# Patient Record
Sex: Female | Born: 1940
Health system: Southern US, Community
[De-identification: ages and names within clinical notes are randomized; demographics above are authoritative.]

## PROBLEM LIST (undated history)

## (undated) DIAGNOSIS — T8859XA Other complications of anesthesia, initial encounter: Secondary | ICD-10-CM

## (undated) DIAGNOSIS — T4145XA Adverse effect of unspecified anesthetic, initial encounter: Secondary | ICD-10-CM

## (undated) DIAGNOSIS — G4730 Sleep apnea, unspecified: Secondary | ICD-10-CM

## (undated) DIAGNOSIS — H9190 Unspecified hearing loss, unspecified ear: Secondary | ICD-10-CM

## (undated) DIAGNOSIS — F419 Anxiety disorder, unspecified: Secondary | ICD-10-CM

## (undated) DIAGNOSIS — I208 Other forms of angina pectoris: Secondary | ICD-10-CM

## (undated) DIAGNOSIS — I2510 Atherosclerotic heart disease of native coronary artery without angina pectoris: Secondary | ICD-10-CM

## (undated) DIAGNOSIS — J45909 Unspecified asthma, uncomplicated: Secondary | ICD-10-CM

## (undated) DIAGNOSIS — C801 Malignant (primary) neoplasm, unspecified: Secondary | ICD-10-CM

## (undated) DIAGNOSIS — I2089 Other forms of angina pectoris: Secondary | ICD-10-CM

## (undated) DIAGNOSIS — I890 Lymphedema, not elsewhere classified: Secondary | ICD-10-CM

## (undated) DIAGNOSIS — J189 Pneumonia, unspecified organism: Secondary | ICD-10-CM

## (undated) DIAGNOSIS — R112 Nausea with vomiting, unspecified: Secondary | ICD-10-CM

## (undated) DIAGNOSIS — U071 COVID-19: Secondary | ICD-10-CM

## (undated) DIAGNOSIS — Z9889 Other specified postprocedural states: Secondary | ICD-10-CM

## (undated) DIAGNOSIS — M1990 Unspecified osteoarthritis, unspecified site: Secondary | ICD-10-CM

## (undated) DIAGNOSIS — H269 Unspecified cataract: Secondary | ICD-10-CM

## (undated) DIAGNOSIS — G2581 Restless legs syndrome: Secondary | ICD-10-CM

## (undated) DIAGNOSIS — R079 Chest pain, unspecified: Secondary | ICD-10-CM

## (undated) HISTORY — PX: CHOLECYSTECTOMY: SHX55

## (undated) HISTORY — DX: Atherosclerotic heart disease of native coronary artery without angina pectoris: I25.10

## (undated) HISTORY — DX: Malignant (primary) neoplasm, unspecified: C80.1

## (undated) HISTORY — DX: Chest pain, unspecified: R07.9

## (undated) HISTORY — PX: ABDOMINAL HYSTERECTOMY: SHX81

## (undated) HISTORY — DX: Lymphedema, not elsewhere classified: I89.0

## (undated) HISTORY — PX: TONSILLECTOMY: SUR1361

## (undated) HISTORY — DX: Anxiety disorder, unspecified: F41.9

## (undated) HISTORY — PX: SHOULDER SURGERY: SHX246

## (undated) HISTORY — PX: BELPHAROPTOSIS REPAIR: SHX369

## (undated) HISTORY — PX: CARDIAC CATHETERIZATION: SHX172

## (undated) HISTORY — PX: BACK SURGERY: SHX140

## (undated) HISTORY — PX: HERNIA REPAIR: SHX51

---

## 2012-12-18 HISTORY — PX: COLONOSCOPY: SHX174

## 2014-10-06 ENCOUNTER — Other Ambulatory Visit: Payer: Self-pay | Admitting: Orthopedic Surgery

## 2014-10-14 ENCOUNTER — Encounter (HOSPITAL_COMMUNITY): Payer: Self-pay | Admitting: Pharmacy Technician

## 2014-10-16 ENCOUNTER — Encounter (HOSPITAL_COMMUNITY)
Admission: RE | Admit: 2014-10-16 | Discharge: 2014-10-16 | Disposition: A | Discharge status: Home / Self Care | Payer: Medicare Other | Source: Ambulatory Visit | Attending: Orthopedic Surgery | Admitting: Orthopedic Surgery

## 2014-10-16 ENCOUNTER — Encounter (HOSPITAL_COMMUNITY): Payer: Self-pay

## 2014-10-16 DIAGNOSIS — Z01812 Encounter for preprocedural laboratory examination: Secondary | ICD-10-CM | POA: Diagnosis present

## 2014-10-16 HISTORY — DX: Adverse effect of unspecified anesthetic, initial encounter: T41.45XA

## 2014-10-16 HISTORY — DX: Unspecified asthma, uncomplicated: J45.909

## 2014-10-16 HISTORY — DX: Other complications of anesthesia, initial encounter: T88.59XA

## 2014-10-16 HISTORY — DX: Unspecified osteoarthritis, unspecified site: M19.90

## 2014-10-16 HISTORY — DX: Pneumonia, unspecified organism: J18.9

## 2014-10-16 HISTORY — DX: Sleep apnea, unspecified: G47.30

## 2014-10-16 HISTORY — DX: Restless legs syndrome: G25.81

## 2014-10-16 LAB — BASIC METABOLIC PANEL
Anion gap: 12 (ref 5–15)
BUN: 16 mg/dL (ref 6–23)
CHLORIDE: 104 meq/L (ref 96–112)
CO2: 27 mEq/L (ref 19–32)
Calcium: 9.5 mg/dL (ref 8.4–10.5)
Creatinine, Ser: 0.58 mg/dL (ref 0.50–1.10)
GFR calc Af Amer: >90 mL/min (ref >90)
GFR calc non Af Amer: 89 mL/min — ABNORMAL LOW (ref >90)
GLUCOSE: 93 mg/dL (ref 70–99)
POTASSIUM: 4.2 meq/L (ref 3.7–5.3)
Sodium: 143 mEq/L (ref 137–147)

## 2014-10-16 LAB — TYPE AND SCREEN
ABO/RH(D): O POS
Antibody Screen: NEGATIVE

## 2014-10-16 LAB — CBC WITH DIFFERENTIAL/PLATELET
Basophils Absolute: 0.1 10*3/uL (ref 0.0–0.1)
Basophils Relative: 1 % (ref 0–1)
EOS ABS: 0.2 10*3/uL (ref 0.0–0.7)
EOS PCT: 3 % (ref 0–5)
HCT: 39.7 % (ref 36.0–46.0)
HEMOGLOBIN: 12.7 g/dL (ref 12.0–15.0)
Lymphocytes Relative: 30 % (ref 12–46)
Lymphs Abs: 2 10*3/uL (ref 0.7–4.0)
MCH: 28.8 pg (ref 26.0–34.0)
MCHC: 32 g/dL (ref 30.0–36.0)
MCV: 90 fL (ref 78.0–100.0)
MONOS PCT: 8 % (ref 3–12)
Monocytes Absolute: 0.5 10*3/uL (ref 0.1–1.0)
Neutro Abs: 3.9 10*3/uL (ref 1.7–7.7)
Neutrophils Relative %: 58 % (ref 43–77)
Platelets: 255 10*3/uL (ref 150–400)
RBC: 4.41 MIL/uL (ref 3.87–5.11)
RDW: 13.9 % (ref 11.5–15.5)
WBC: 6.8 10*3/uL (ref 4.0–10.5)

## 2014-10-16 LAB — ABO/RH: ABO/RH(D): O POS

## 2014-10-16 LAB — SURGICAL PCR SCREEN
MRSA, PCR: NEGATIVE
Staphylococcus aureus: NEGATIVE

## 2014-10-16 LAB — APTT: aPTT: 35 seconds (ref 24–37)

## 2014-10-16 LAB — PROTIME-INR
INR: 0.97 (ref 0.00–1.49)
Prothrombin Time: 13 seconds (ref 11.6–15.2)

## 2014-10-16 NOTE — Progress Notes (Signed)
Primary - dr. Butch Penny stone  hasnt seen cardiologist ekg and chest xray done in July 2015 at [facility] in [place] - will request records

## 2014-10-16 NOTE — Pre-Procedure Instructions (Signed)
Pennye A Golberg  10/16/2014   Your procedure is scheduled on:  Monday, November 9th  Report to [facility] Admitting at 530 AM.  Call this number if you have problems the morning of surgery: [phone]   Remember:   Do not eat food or drink liquids after midnight.   Take these medicines the morning of surgery with A SIP OF WATER: gabapentin, symbicort, vicodin if needed  Stop taking aspirin, OTC vitamins/herbal medications, NSAIDS (ibuprofen, advil, motrin) 7 days prior to surgery.    Do not wear jewelry, make-up or nail polish.  Do not wear lotions, powders, or perfumes. You may wear deodorant.  Do not shave 48 hours prior to surgery. Men may shave face and neck.  Do not bring valuables to the hospital.  [facility] is not responsible  for any belongings or valuables.               Contacts, dentures or bridgework may not be worn into surgery.  Leave suitcase in the car. After surgery it may be brought to your room.  For patients admitted to the hospital, discharge time is determined by your treatment team.          Please read over the following fact sheets that you were given: Pain Booklet, Coughing and Deep Breathing, Blood Transfusion Information, MRSA Information and Surgical Site Infection Prevention  - Preparing for Surgery  Before surgery, you can play an important role.  Because skin is not sterile, your skin needs to be as free of germs as possible.  You can reduce the number of germs on you skin by washing with CHG (chlorahexidine gluconate) soap before surgery.  CHG is an antiseptic cleaner which kills germs and bonds with the skin to continue killing germs even after washing.  Please DO NOT use if you have an allergy to CHG or antibacterial soaps.  If your skin becomes reddened/irritated stop using the CHG and inform your nurse when you arrive at Short Stay.  Do not shave (including legs and underarms) for at least 48 hours prior to the first CHG  shower.  You may shave your face.  Please follow these instructions carefully:   1.  Shower with CHG Soap the night before surgery and the morning of Surgery.  2.  If you choose to wash your hair, wash your hair first as usual with your normal shampoo.  3.  After you shampoo, rinse your hair and body thoroughly to remove the shampoo.  4.  Use CHG as you would any other liquid soap.  You can apply CHG directly to the skin and wash gently with scrungie or a clean washcloth.  5.  Apply the CHG Soap to your body ONLY FROM THE NECK DOWN.  Do not use on open wounds or open sores.  Avoid contact with your eyes, ears, mouth and genitals (private parts).  Wash genitals (private parts) with your normal soap.  6.  Wash thoroughly, paying special attention to the area where your surgery will be performed.  7.  Thoroughly rinse your body with warm water from the neck down.  8.  DO NOT shower/wash with your normal soap after using and rinsing off the CHG Soap.  9.  Pat yourself dry with a clean towel.            10.  Wear clean pajamas.            11.  Place clean sheets on your bed  the night of your first shower and do not sleep with pets.  Day of Surgery  Do not apply any lotions/deoderants the morning of surgery.  Please wear clean clothes to the hospital/surgery center.

## 2014-10-17 LAB — URINE CULTURE

## 2014-10-22 NOTE — H&P (Signed)
TOTAL KNEE ADMISSION H&P  Patient is being admitted for left total knee arthroplasty.  Subjective:  Chief Complaint:left knee pain.  HPI: Kristin Roach, 73 y.o. female, has a history of pain and functional disability in the left knee due to arthritis and has failed non-surgical conservative treatments for greater than 12 weeks to includeNSAID's and/or analgesics, corticosteriod injections, viscosupplementation injections and activity modification.  Onset of symptoms was gradual, starting several years ago with gradually worsening course since that time. The patient noted no past surgery on the left knee(s).  Patient currently rates pain in the left knee(s) at 10 out of 10 with activity. Patient has night pain, worsening of pain with activity and weight bearing, pain that interferes with activities of daily living and pain with passive range of motion.  Patient has evidence of joint space narrowing by imaging studies. . There is no active infection.  There are no active problems to display for this patient.  Past Medical History  Diagnosis Date  . Complication of anesthesia     woke up during back procedure  . Sleep apnea     cpap  . Asthma     bronchial asthma  . Pneumonia     hx of 2012  . Arthritis   . RLS (restless legs syndrome)     Past Surgical History  Procedure Laterality Date  . Shoulder surgery      removal of bone spur  . Cardiac catheterization    . Cesarean section    . Hernia repair    . Cholecystectomy    . Tonsillectomy    . Back surgery      laminectomy  . Belpharoptosis repair    . Abdominal hysterectomy      No prescriptions prior to admission   Allergies  Allergen Reactions  . Epinephrine Anaphylaxis  . Demerol [Meperidine] Itching  . Erythromycin Nausea And Vomiting  . Flagyl [Metronidazole] Hives    Large amounts    History  Substance Use Topics  . Smoking status: Never Smoker   . Smokeless tobacco: Not on file  . Alcohol Use: 8.4 oz/week   14 Glasses of wine per week    No family history on file.   Review of Systems  Constitutional: Positive for malaise/fatigue.  HENT: Positive for hearing loss.   Eyes: Negative.   Respiratory: Positive for shortness of breath.   Cardiovascular: Negative.   Gastrointestinal: Negative.   Genitourinary: Positive for frequency.  Musculoskeletal: Positive for joint pain.  Skin: Negative.   Neurological:       Poor balance  Endo/Heme/Allergies: Negative.   Psychiatric/Behavioral: Negative.     Objective:  Physical Exam  Constitutional: She is oriented to person, place, and time. She appears well-developed and well-nourished.  HENT:  Head: Normocephalic and atraumatic.  Eyes: Pupils are equal, round, and reactive to light.  Neck: Normal range of motion. Neck supple.  Cardiovascular: Intact distal pulses.   Respiratory: Effort normal.  Musculoskeletal: She exhibits tenderness.  The left knee is tender along the medial greater than lateral joint line.  There is crepitus as you take her through range of motion from 5 205.  Collateral ligaments are stable.  Her skin is intact no cuts.  Scrapes or abrasions.  Normal sensation to her feet.  Her toes are warm and well-perfused.  Neurological: She is alert and oriented to person, place, and time.  Skin: Skin is warm and dry.  Psychiatric: She has a normal mood and affect. Her behavior  is normal. Judgment and thought content normal.    Vital signs in last 24 hours:    Labs:   There is no height or weight on file to calculate BMI.   Imaging Review Plain radiographs demonstrate significant osteophytes.  She's not quite bone on bone at this time, but she deathly has irregularity of the bony surface consistent with severe osteoarthritis.   Assessment/Plan:  End stage arthritis, left knee   The patient history, physical examination, clinical judgment of the provider and imaging studies are consistent with end stage degenerative joint  disease of the left knee(s) and total knee arthroplasty is deemed medically necessary. The treatment options including medical management, injection therapy arthroscopy and arthroplasty were discussed at length. The risks and benefits of total knee arthroplasty were presented and reviewed. The risks due to aseptic loosening, infection, stiffness, patella tracking problems, thromboembolic complications and other imponderables were discussed. The patient acknowledged the explanation, agreed to proceed with the plan and consent was signed. Patient is being admitted for inpatient treatment for surgery, pain control, PT, OT, prophylactic antibiotics, VTE prophylaxis, progressive ambulation and ADL's and discharge planning. The patient is planning to be discharged home with home health services

## 2014-10-25 MED ORDER — BUPIVACAINE LIPOSOME 1.3 % IJ SUSP
20.0000 mL | Freq: Once | INTRAMUSCULAR | Status: DC
  Filled 2014-10-25: qty 20

## 2014-10-25 MED ORDER — CEFAZOLIN SODIUM-DEXTROSE 2-3 GM-% IV SOLR
2.0000 g | INTRAVENOUS | Status: AC
Start: 2014-10-26 — End: 2014-10-26
  Administered 2014-10-26: 2 g via INTRAVENOUS
  Filled 2014-10-25: qty 50

## 2014-10-25 MED ORDER — SODIUM CHLORIDE 0.9 % IV SOLN
1000.0000 mg | INTRAVENOUS | Status: DC
  Filled 2014-10-25: qty 10

## 2014-10-25 MED ORDER — KCL IN DEXTROSE-NACL 40-5-0.45 MEQ/L-%-% IV SOLN
INTRAVENOUS | Status: DC
  Filled 2014-10-25 (×2): qty 1000

## 2014-10-26 ENCOUNTER — Encounter (HOSPITAL_COMMUNITY)
Admission: RE | Disposition: A | Discharge status: Home Health | Payer: Self-pay | Source: Ambulatory Visit | Attending: Orthopedic Surgery

## 2014-10-26 ENCOUNTER — Inpatient Hospital Stay (HOSPITAL_COMMUNITY): Payer: Medicare Other | Admitting: Anesthesiology

## 2014-10-26 ENCOUNTER — Encounter (HOSPITAL_COMMUNITY): Payer: Self-pay | Admitting: *Deleted

## 2014-10-26 ENCOUNTER — Inpatient Hospital Stay (HOSPITAL_COMMUNITY)
Admission: RE | Admit: 2014-10-26 | Discharge: 2014-10-29 | DRG: 470 | Disposition: A | Discharge status: Home Health | Payer: Medicare Other | Source: Ambulatory Visit | Attending: Orthopedic Surgery | Admitting: Orthopedic Surgery

## 2014-10-26 DIAGNOSIS — G4730 Sleep apnea, unspecified: Secondary | ICD-10-CM | POA: Diagnosis present

## 2014-10-26 DIAGNOSIS — G2581 Restless legs syndrome: Secondary | ICD-10-CM | POA: Diagnosis present

## 2014-10-26 DIAGNOSIS — J449 Chronic obstructive pulmonary disease, unspecified: Secondary | ICD-10-CM | POA: Diagnosis present

## 2014-10-26 DIAGNOSIS — M1712 Unilateral primary osteoarthritis, left knee: Principal | ICD-10-CM | POA: Diagnosis present

## 2014-10-26 DIAGNOSIS — J45909 Unspecified asthma, uncomplicated: Secondary | ICD-10-CM | POA: Diagnosis present

## 2014-10-26 HISTORY — DX: Other specified postprocedural states: Z98.890

## 2014-10-26 HISTORY — PX: TOTAL KNEE ARTHROPLASTY: SHX125

## 2014-10-26 HISTORY — DX: Other specified postprocedural states: R11.2

## 2014-10-26 SURGERY — ARTHROPLASTY, KNEE, TOTAL
Anesthesia: General | Site: Knee | Laterality: Left

## 2014-10-26 MED ORDER — GABAPENTIN 100 MG PO CAPS
100.0000 mg | ORAL_CAPSULE | Freq: Three times a day (TID) | ORAL | Status: DC
  Administered 2014-10-26 – 2014-10-29 (×10): 100 mg via ORAL
  Filled 2014-10-26 (×12): qty 1

## 2014-10-26 MED ORDER — METHOCARBAMOL 1000 MG/10ML IJ SOLN
500.0000 mg | INTRAMUSCULAR | Status: AC
  Administered 2014-10-26: 500 mg via INTRAVENOUS
  Filled 2014-10-26: qty 5

## 2014-10-26 MED ORDER — ACETAMINOPHEN 650 MG RE SUPP: 650.0000 mg | Freq: Four times a day (QID) | RECTAL | Status: DC | PRN

## 2014-10-26 MED ORDER — MELATONIN 10 MG PO CAPS: 10.0000 mg | ORAL_CAPSULE | Freq: Every evening | ORAL | Status: DC | PRN

## 2014-10-26 MED ORDER — ONDANSETRON HCL 4 MG/2ML IJ SOLN
INTRAMUSCULAR | Status: DC | PRN
  Administered 2014-10-26: 4 mg via INTRAVENOUS

## 2014-10-26 MED ORDER — HYDROMORPHONE HCL 1 MG/ML IJ SOLN
INTRAMUSCULAR | Status: AC
  Administered 2014-10-26: 0.5 mg
  Filled 2014-10-26: qty 1

## 2014-10-26 MED ORDER — SUCCINYLCHOLINE CHLORIDE 20 MG/ML IJ SOLN
INTRAMUSCULAR | Status: AC
Start: 2014-10-26 — End: 2014-10-26
  Filled 2014-10-26: qty 1

## 2014-10-26 MED ORDER — SODIUM CHLORIDE 0.9 % IV SOLN
1000.0000 mg | INTRAVENOUS | Status: DC | PRN
  Administered 2014-10-26: 1000 mg via INTRAVENOUS

## 2014-10-26 MED ORDER — PROPOFOL 10 MG/ML IV BOLUS
INTRAVENOUS | Status: AC
  Filled 2014-10-26: qty 20

## 2014-10-26 MED ORDER — LABETALOL HCL 5 MG/ML IV SOLN
INTRAVENOUS | Status: AC
Start: 2014-10-26 — End: 2014-10-26
  Filled 2014-10-26: qty 4

## 2014-10-26 MED ORDER — LIDOCAINE HCL (CARDIAC) 20 MG/ML IV SOLN
INTRAVENOUS | Status: AC
  Filled 2014-10-26: qty 5

## 2014-10-26 MED ORDER — BUPIVACAINE LIPOSOME 1.3 % IJ SUSP
INTRAMUSCULAR | Status: DC | PRN
  Administered 2014-10-26: 20 mL

## 2014-10-26 MED ORDER — CEFUROXIME SODIUM 1.5 G IJ SOLR
INTRAMUSCULAR | Status: DC | PRN
  Administered 2014-10-26: 1.5 g

## 2014-10-26 MED ORDER — METHOCARBAMOL 1000 MG/10ML IJ SOLN: 500.0000 mg | Freq: Four times a day (QID) | INTRAVENOUS | Status: DC | PRN

## 2014-10-26 MED ORDER — METOCLOPRAMIDE HCL 10 MG PO TABS
5.0000 mg | ORAL_TABLET | Freq: Three times a day (TID) | ORAL | Status: DC | PRN
  Administered 2014-10-26: 10 mg via ORAL
  Filled 2014-10-26: qty 1

## 2014-10-26 MED ORDER — CEFUROXIME SODIUM 1.5 G IJ SOLR
INTRAMUSCULAR | Status: AC
  Filled 2014-10-26: qty 1.5

## 2014-10-26 MED ORDER — MIDAZOLAM HCL 2 MG/2ML IJ SOLN
INTRAMUSCULAR | Status: AC
  Filled 2014-10-26: qty 2

## 2014-10-26 MED ORDER — TIZANIDINE HCL 2 MG PO CAPS: 2.0000 mg | ORAL_CAPSULE | Freq: Three times a day (TID) | ORAL | Status: DC

## 2014-10-26 MED ORDER — MIDAZOLAM HCL 5 MG/5ML IJ SOLN
INTRAMUSCULAR | Status: DC | PRN
  Administered 2014-10-26: 2 mg via INTRAVENOUS

## 2014-10-26 MED ORDER — DIPHENHYDRAMINE HCL 12.5 MG/5ML PO ELIX: 12.5000 mg | ORAL_SOLUTION | ORAL | Status: DC | PRN

## 2014-10-26 MED ORDER — SODIUM CHLORIDE 0.9 % IV SOLN
INTRAVENOUS | Status: DC | PRN
  Administered 2014-10-26: 40 mL via INTRAMUSCULAR

## 2014-10-26 MED ORDER — KCL IN DEXTROSE-NACL 20-5-0.45 MEQ/L-%-% IV SOLN
INTRAVENOUS | Status: DC
  Administered 2014-10-26 – 2014-10-27 (×2): via INTRAVENOUS
  Filled 2014-10-26 (×12): qty 1000

## 2014-10-26 MED ORDER — FENTANYL CITRATE 0.05 MG/ML IJ SOLN
INTRAMUSCULAR | Status: DC | PRN
  Administered 2014-10-26: 25 ug via INTRAVENOUS
  Administered 2014-10-26: 50 ug via INTRAVENOUS
  Administered 2014-10-26 (×4): 25 ug via INTRAVENOUS
  Administered 2014-10-26: 50 ug via INTRAVENOUS
  Administered 2014-10-26: 25 ug via INTRAVENOUS

## 2014-10-26 MED ORDER — FENTANYL CITRATE 0.05 MG/ML IJ SOLN
INTRAMUSCULAR | Status: AC
  Filled 2014-10-26: qty 5

## 2014-10-26 MED ORDER — SODIUM CHLORIDE 0.9 % IR SOLN
Status: DC | PRN
  Administered 2014-10-26: 1000 mL

## 2014-10-26 MED ORDER — ROCURONIUM BROMIDE 50 MG/5ML IV SOLN
INTRAVENOUS | Status: AC
  Filled 2014-10-26: qty 1

## 2014-10-26 MED ORDER — FLEET ENEMA 7-19 GM/118ML RE ENEM: 1.0000 | ENEMA | Freq: Once | RECTAL | Status: AC | PRN

## 2014-10-26 MED ORDER — ONDANSETRON HCL 4 MG PO TABS: 4.0000 mg | ORAL_TABLET | Freq: Four times a day (QID) | ORAL | Status: DC | PRN

## 2014-10-26 MED ORDER — ONDANSETRON HCL 4 MG/2ML IJ SOLN
INTRAMUSCULAR | Status: AC
  Filled 2014-10-26: qty 2

## 2014-10-26 MED ORDER — ONDANSETRON HCL 4 MG/2ML IJ SOLN
4.0000 mg | Freq: Four times a day (QID) | INTRAMUSCULAR | Status: DC | PRN
  Administered 2014-10-26 – 2014-10-27 (×2): 4 mg via INTRAVENOUS
  Filled 2014-10-26 (×2): qty 2

## 2014-10-26 MED ORDER — PROPOFOL 10 MG/ML IV BOLUS
INTRAVENOUS | Status: DC | PRN
Start: 2014-10-26 — End: 2014-10-26
  Administered 2014-10-26: 150 mg via INTRAVENOUS

## 2014-10-26 MED ORDER — EPHEDRINE SULFATE 50 MG/ML IJ SOLN
INTRAMUSCULAR | Status: AC
  Filled 2014-10-26: qty 1

## 2014-10-26 MED ORDER — STERILE WATER FOR INJECTION IJ SOLN
INTRAMUSCULAR | Status: AC
  Filled 2014-10-26: qty 10

## 2014-10-26 MED ORDER — METOCLOPRAMIDE HCL 5 MG/ML IJ SOLN
5.0000 mg | Freq: Three times a day (TID) | INTRAMUSCULAR | Status: DC | PRN
  Administered 2014-10-27: 10 mg via INTRAVENOUS
  Filled 2014-10-26: qty 2

## 2014-10-26 MED ORDER — HYDROMORPHONE HCL 1 MG/ML IJ SOLN
INTRAMUSCULAR | Status: AC
  Filled 2014-10-26: qty 1

## 2014-10-26 MED ORDER — ARTIFICIAL TEARS OP OINT
TOPICAL_OINTMENT | OPHTHALMIC | Status: AC
  Filled 2014-10-26: qty 3.5

## 2014-10-26 MED ORDER — PHENOL 1.4 % MT LIQD: 1.0000 | OROMUCOSAL | Status: DC | PRN

## 2014-10-26 MED ORDER — WHITE PETROLATUM GEL
Status: AC
  Administered 2014-10-26: 0.2
  Filled 2014-10-26: qty 5

## 2014-10-26 MED ORDER — PHENYLEPHRINE 40 MCG/ML (10ML) SYRINGE FOR IV PUSH (FOR BLOOD PRESSURE SUPPORT)
PREFILLED_SYRINGE | INTRAVENOUS | Status: AC
  Filled 2014-10-26: qty 10

## 2014-10-26 MED ORDER — DEXAMETHASONE SODIUM PHOSPHATE 4 MG/ML IJ SOLN
INTRAMUSCULAR | Status: AC
  Filled 2014-10-26: qty 1

## 2014-10-26 MED ORDER — PROMETHAZINE HCL 25 MG/ML IJ SOLN: 6.2500 mg | INTRAMUSCULAR | Status: DC | PRN

## 2014-10-26 MED ORDER — HYDROCODONE-ACETAMINOPHEN 10-325 MG PO TABS: 1.0000 | ORAL_TABLET | Freq: Four times a day (QID) | ORAL | Status: DC | PRN

## 2014-10-26 MED ORDER — METHOCARBAMOL 500 MG PO TABS: 500.0000 mg | ORAL_TABLET | Freq: Four times a day (QID) | ORAL | Status: DC | PRN

## 2014-10-26 MED ORDER — BISACODYL 5 MG PO TBEC
5.0000 mg | DELAYED_RELEASE_TABLET | Freq: Every day | ORAL | Status: DC | PRN
  Administered 2014-10-28: 5 mg via ORAL
  Filled 2014-10-26: qty 1

## 2014-10-26 MED ORDER — MENTHOL 3 MG MT LOZG: 1.0000 | LOZENGE | OROMUCOSAL | Status: DC | PRN

## 2014-10-26 MED ORDER — DOCUSATE SODIUM 100 MG PO CAPS
100.0000 mg | ORAL_CAPSULE | Freq: Two times a day (BID) | ORAL | Status: DC
  Administered 2014-10-26 – 2014-10-29 (×7): 100 mg via ORAL
  Filled 2014-10-26 (×8): qty 1

## 2014-10-26 MED ORDER — ROPINIROLE HCL 1 MG PO TABS
1.0000 mg | ORAL_TABLET | Freq: Every day | ORAL | Status: DC
  Administered 2014-10-26 – 2014-10-28 (×3): 1 mg via ORAL
  Filled 2014-10-26 (×4): qty 1

## 2014-10-26 MED ORDER — ALUM & MAG HYDROXIDE-SIMETH 200-200-20 MG/5ML PO SUSP: 30.0000 mL | ORAL | Status: DC | PRN

## 2014-10-26 MED ORDER — BUDESONIDE-FORMOTEROL FUMARATE 80-4.5 MCG/ACT IN AERO
2.0000 | INHALATION_SPRAY | Freq: Two times a day (BID) | RESPIRATORY_TRACT | Status: DC
  Administered 2014-10-27 – 2014-10-29 (×4): 2 via RESPIRATORY_TRACT
  Filled 2014-10-26: qty 6.9

## 2014-10-26 MED ORDER — SENNOSIDES-DOCUSATE SODIUM 8.6-50 MG PO TABS: 1.0000 | ORAL_TABLET | Freq: Every evening | ORAL | Status: DC | PRN

## 2014-10-26 MED ORDER — ASPIRIN EC 325 MG PO TBEC: 325.0000 mg | DELAYED_RELEASE_TABLET | Freq: Two times a day (BID) | ORAL | Status: DC

## 2014-10-26 MED ORDER — HYDROMORPHONE HCL 1 MG/ML IJ SOLN
0.2500 mg | INTRAMUSCULAR | Status: DC | PRN
  Administered 2014-10-26 (×2): 0.5 mg via INTRAVENOUS
  Administered 2014-10-26 (×2): 0.25 mg via INTRAVENOUS

## 2014-10-26 MED ORDER — ROPIVACAINE HCL 5 MG/ML IJ SOLN
INTRAMUSCULAR | Status: DC | PRN
  Administered 2014-10-26: 25 mL via PERINEURAL

## 2014-10-26 MED ORDER — ASPIRIN EC 325 MG PO TBEC
325.0000 mg | DELAYED_RELEASE_TABLET | Freq: Every day | ORAL | Status: DC
  Administered 2014-10-27 – 2014-10-29 (×3): 325 mg via ORAL
  Filled 2014-10-26 (×4): qty 1

## 2014-10-26 MED ORDER — LIDOCAINE HCL (CARDIAC) 20 MG/ML IV SOLN
INTRAVENOUS | Status: DC | PRN
  Administered 2014-10-26: 80 mg via INTRAVENOUS

## 2014-10-26 MED ORDER — LACTATED RINGERS IV SOLN
INTRAVENOUS | Status: DC | PRN
  Administered 2014-10-26 (×2): via INTRAVENOUS

## 2014-10-26 MED ORDER — LABETALOL HCL 5 MG/ML IV SOLN
INTRAVENOUS | Status: DC | PRN
  Administered 2014-10-26: 5 mg via INTRAVENOUS

## 2014-10-26 MED ORDER — PHENYLEPHRINE HCL 10 MG/ML IJ SOLN
INTRAMUSCULAR | Status: DC | PRN
  Administered 2014-10-26: 40 ug via INTRAVENOUS
  Administered 2014-10-26 (×2): 80 ug via INTRAVENOUS

## 2014-10-26 MED ORDER — ACETAMINOPHEN 325 MG PO TABS
650.0000 mg | ORAL_TABLET | Freq: Four times a day (QID) | ORAL | Status: DC | PRN
  Administered 2014-10-27: 650 mg via ORAL
  Filled 2014-10-26: qty 2

## 2014-10-26 MED ORDER — HYDROCODONE-ACETAMINOPHEN 10-325 MG PO TABS
1.0000 | ORAL_TABLET | ORAL | Status: DC | PRN
  Administered 2014-10-26 – 2014-10-27 (×5): 1 via ORAL
  Administered 2014-10-28 – 2014-10-29 (×8): 2 via ORAL
  Filled 2014-10-26 (×5): qty 2
  Filled 2014-10-26: qty 1
  Filled 2014-10-26 (×2): qty 2
  Filled 2014-10-26 (×4): qty 1
  Filled 2014-10-26: qty 2

## 2014-10-26 MED ORDER — HYDROMORPHONE HCL 1 MG/ML IJ SOLN
1.0000 mg | INTRAMUSCULAR | Status: DC | PRN
  Administered 2014-10-26 – 2014-10-27 (×3): 1 mg via INTRAVENOUS
  Filled 2014-10-26 (×4): qty 1

## 2014-10-26 SURGICAL SUPPLY — 59 items
BANDAGE ESMARK 6X9 LF (GAUZE/BANDAGES/DRESSINGS) ×1 IMPLANT
BLADE SAG 18X100X1.27 (BLADE) ×2 IMPLANT
BLADE SAW SGTL 13X75X1.27 (BLADE) ×2 IMPLANT
BLADE SURG ROTATE 9660 (MISCELLANEOUS) IMPLANT
BNDG ELASTIC 6X10 VLCR STRL LF (GAUZE/BANDAGES/DRESSINGS) ×2 IMPLANT
BNDG ESMARK 6X9 LF (GAUZE/BANDAGES/DRESSINGS) ×2
BOWL SMART MIX CTS (DISPOSABLE) ×2 IMPLANT
CAPT RP KNEE ×2 IMPLANT
CEMENT HV SMART SET (Cement) ×4 IMPLANT
COVER SURGICAL LIGHT HANDLE (MISCELLANEOUS) ×2 IMPLANT
CUFF TOURNIQUET SINGLE 34IN LL (TOURNIQUET CUFF) ×2 IMPLANT
CUFF TOURNIQUET SINGLE 44IN (TOURNIQUET CUFF) IMPLANT
DRAPE EXTREMITY T 121X128X90 (DRAPE) ×2 IMPLANT
DRAPE IMP U-DRAPE 54X76 (DRAPES) ×2 IMPLANT
DRAPE PROXIMA HALF (DRAPES) ×2 IMPLANT
DRAPE U-SHAPE 47X51 STRL (DRAPES) ×2 IMPLANT
DURAPREP 26ML APPLICATOR (WOUND CARE) ×2 IMPLANT
ELECT REM PT RETURN 9FT ADLT (ELECTROSURGICAL) ×2
ELECTRODE REM PT RTRN 9FT ADLT (ELECTROSURGICAL) ×1 IMPLANT
EVACUATOR 1/8 PVC DRAIN (DRAIN) ×2 IMPLANT
GAUZE SPONGE 4X4 12PLY STRL (GAUZE/BANDAGES/DRESSINGS) ×2 IMPLANT
GAUZE XEROFORM 1X8 LF (GAUZE/BANDAGES/DRESSINGS) ×2 IMPLANT
GLOVE BIO SURGEON STRL SZ7.5 (GLOVE) ×2 IMPLANT
GLOVE BIO SURGEON STRL SZ8.5 (GLOVE) ×2 IMPLANT
GLOVE BIOGEL PI IND STRL 8 (GLOVE) ×1 IMPLANT
GLOVE BIOGEL PI IND STRL 9 (GLOVE) ×1 IMPLANT
GLOVE BIOGEL PI INDICATOR 8 (GLOVE) ×1
GLOVE BIOGEL PI INDICATOR 9 (GLOVE) ×1
GOWN STRL REUS W/ TWL LRG LVL3 (GOWN DISPOSABLE) ×1 IMPLANT
GOWN STRL REUS W/ TWL XL LVL3 (GOWN DISPOSABLE) ×2 IMPLANT
GOWN STRL REUS W/TWL LRG LVL3 (GOWN DISPOSABLE) ×1
GOWN STRL REUS W/TWL XL LVL3 (GOWN DISPOSABLE) ×2
HANDPIECE INTERPULSE COAX TIP (DISPOSABLE) ×1
HOOD PEEL AWAY FACE SHEILD DIS (HOOD) ×4 IMPLANT
KIT BASIN OR (CUSTOM PROCEDURE TRAY) ×2 IMPLANT
KIT ROOM TURNOVER OR (KITS) ×2 IMPLANT
MANIFOLD NEPTUNE II (INSTRUMENTS) ×2 IMPLANT
NDL SAFETY ECLIPSE 18X1.5 (NEEDLE) IMPLANT
NEEDLE 22X1 1/2 (OR ONLY) (NEEDLE) IMPLANT
NEEDLE HYPO 18GX1.5 SHARP (NEEDLE)
NEEDLE SPNL 18GX3.5 QUINCKE PK (NEEDLE) ×2 IMPLANT
NS IRRIG 1000ML POUR BTL (IV SOLUTION) ×2 IMPLANT
PACK TOTAL JOINT (CUSTOM PROCEDURE TRAY) ×2 IMPLANT
PACK UNIVERSAL I (CUSTOM PROCEDURE TRAY) ×2 IMPLANT
PAD ARMBOARD 7.5X6 YLW CONV (MISCELLANEOUS) ×2 IMPLANT
PADDING CAST COTTON 6X4 STRL (CAST SUPPLIES) ×2 IMPLANT
SET HNDPC FAN SPRY TIP SCT (DISPOSABLE) ×1 IMPLANT
STAPLER VISISTAT 35W (STAPLE) ×2 IMPLANT
SUCTION FRAZIER TIP 10 FR DISP (SUCTIONS) IMPLANT
SUT VIC AB 0 CTX 36 (SUTURE) ×1
SUT VIC AB 0 CTX36XBRD ANTBCTR (SUTURE) ×1 IMPLANT
SUT VIC AB 1 CTX 36 (SUTURE) ×1
SUT VIC AB 1 CTX36XBRD ANBCTR (SUTURE) ×1 IMPLANT
SUT VIC AB 2-0 CT1 27 (SUTURE) ×1
SUT VIC AB 2-0 CT1 TAPERPNT 27 (SUTURE) ×1 IMPLANT
SYR 30ML LL (SYRINGE) ×2 IMPLANT
SYR 50ML LL SCALE MARK (SYRINGE) ×2 IMPLANT
TOWEL OR 17X24 6PK STRL BLUE (TOWEL DISPOSABLE) ×2 IMPLANT
TOWEL OR 17X26 10 PK STRL BLUE (TOWEL DISPOSABLE) ×2 IMPLANT

## 2014-10-26 NOTE — Plan of Care (Signed)
Problem: Consults Goal: Diagnosis- Total Joint Replacement Primary Total Knee Left  Problem: Phase I Progression Outcomes Goal: CMS/Neurovascular status WDL Outcome: Completed/Met Date Met:  10/26/14 Goal: Hemodynamically stable Outcome: Completed/Met Date Met:  10/26/14

## 2014-10-26 NOTE — Progress Notes (Signed)
PHARMACIST - PHYSICIAN ORDER COMMUNICATION  CONCERNING: P&T Medication Policy on Herbal Medications  DESCRIPTION:  This patient's order for:  Melatonin  has been noted.  This product(s) is classified as an "herbal" or natural product. Due to a lack of definitive safety studies or FDA approval, nonstandard manufacturing practices, plus the potential risk of unknown drug-drug interactions while on inpatient medications, the Pharmacy and Therapeutics Committee does not permit the use of "herbal" or natural products of this type within [facility].   ACTION TAKEN: The pharmacy department is unable to verify this order at this time and your patient has been informed of this safety policy. Please reevaluate patient's clinical condition at discharge and address if the herbal or natural product(s) should be resumed at that time.   Hildred Laser, Pharm D 10/26/2014 1:12 PM

## 2014-10-26 NOTE — Interval H&P Note (Signed)
History and Physical Interval Note:  10/26/2014 7:19 AM  Kristin Roach  has presented today for surgery, with the diagnosis of LEFT KNEE OSTEOARTHRITIS  The various methods of treatment have been discussed with the patient and family. After consideration of risks, benefits and other options for treatment, the patient has consented to  Procedure(s): LEFT TOTAL KNEE ARTHROPLASTY (Left) as a surgical intervention .  The patient's history has been reviewed, patient examined, no change in status, stable for surgery.  I have reviewed the patient's chart and labs.  Questions were answered to the patient's satisfaction.     Kerin Salen

## 2014-10-26 NOTE — Transfer of Care (Signed)
Immediate Anesthesia Transfer of Care Note  Patient: Kristin Roach  Procedure(s) Performed: Procedure(s): LEFT TOTAL KNEE ARTHROPLASTY (Left)  Patient Location: PACU  Anesthesia Type:General  Level of Consciousness: awake and alert   Airway & Oxygen Therapy: Patient Spontanous Breathing and Patient connected to face mask oxygen  Post-op Assessment: Report given to PACU RN and Post -op Vital signs reviewed and stable  Post vital signs: Reviewed and stable  Complications: No apparent anesthesia complications

## 2014-10-26 NOTE — Op Note (Signed)
PATIENT ID:      Kristin Roach  MRN:     [id number] DOB/AGE:    Apr 12, 1941 / 73 y.o.       OPERATIVE REPORT    DATE OF PROCEDURE:  10/26/2014       PREOPERATIVE DIAGNOSIS:   LEFT KNEE OSTEOARTHRITIS      Estimated body mass index is 34.37 kg/(m^2) as calculated from the following:   Height as of this encounter: 5' (1.524 m).   Weight as of this encounter: 79.833 kg (176 lb).                                                        POSTOPERATIVE DIAGNOSIS:   LEFT KNEE OSTEOARTHRITIS                                                                      PROCEDURE:  Procedure(s): LEFT TOTAL KNEE ARTHROPLASTY Using Depuy Sigma RP implants #3L Femur, #3Tibia, 6mm Sigma RP bearing, 38 Patella     SURGEON: Rabab Currington J    ASSISTANT:   [employer]   (Present and scrubbed throughout the case, critical for assistance with exposure, retraction, instrumentation, and closure.)         ANESTHESIA: GET, ACB, Exparel  DRAINS: 2 medium hemovac in knee   TOURNIQUET TIME: 40JWJ   COMPLICATIONS:  None     SPECIMENS: None   INDICATIONS FOR PROCEDURE: The patient has  LEFT KNEE OSTEOARTHRITIS, varus deformities, XR shows bone on bone arthritis. Patient has failed all conservative measures including anti-inflammatory medicines, narcotics, attempts at  exercise and weight loss, cortisone injections and viscosupplementation.  Risks and benefits of surgery have been discussed, questions answered.   DESCRIPTION OF PROCEDURE: The patient identified by armband, received  IV antibiotics, in the holding area at [facility]. Patient taken to the operating room, appropriate anesthetic  monitors were attached, and general endotracheal anesthesia induced with  the patient in supine position, Foley catheter was inserted. Tourniquet  applied high to the operative thigh. Lateral post and foot positioner  applied to the table, the lower extremity was then prepped and draped  in usual sterile fashion from  the ankle to the tourniquet. Time-out procedure was performed. The limb was wrapped with an Esmarch bandage and the tourniquet inflated to 350 mmHg. We began the operation by making the anterior midline incision starting at handbreadth above the patella going over the patella 1 cm medial to and  4 cm distal to the tibial tubercle. Small bleeders in the skin and the  subcutaneous tissue identified and cauterized. Transverse retinaculum was incised and reflected medially and a medial parapatellar arthrotomy was accomplished. the patella was everted and theprepatellar fat pad resected. The superficial medial collateral  ligament was then elevated from anterior to posterior along the proximal  flare of the tibia and anterior half of the menisci resected. The knee was hyperflexed exposing bone on bone arthritis. Peripheral and notch osteophytes as well as the cruciate ligaments were then resected. We continued to  work our way around posteriorly  along the proximal tibia, and externally  rotated the tibia subluxing it out from underneath the femur. A McHale  retractor was placed through the notch and a lateral Hohmann retractor  placed, and we then drilled through the proximal tibia in line with the  axis of the tibia followed by an intramedullary guide rod and 2-degree  posterior slope cutting guide. The tibial cutting guide was pinned into place  allowing resection of 5 mm of bone medially and about 10 mm of bone  laterally because of her varus deformity. Satisfied with the tibial resection, we then  entered the distal femur 2 mm anterior to the PCL origin with the  intramedullary guide rod and applied the distal femoral cutting guide  set at 63mm, with 5 degrees of valgus. This was pinned along the  epicondylar axis. At this point, the distal femoral cut was accomplished without difficulty. We then sized for a #3L femoral component and pinned the guide in 3 degrees of external rotation.The chamfer  cutting guide was pinned into place. The anterior, posterior, and chamfer cuts were accomplished without difficulty followed by  the box cutting guide and the box cut. We also removed posterior osteophytes from the posterior femoral condyles. At this  time, the knee was brought into full extension. We checked our  extension and flexion gaps and found them symmetric at 49mm.  The patella thickness measured at 25 mm. We set the cutting guide at 15 and removed the posterior 10 mm  of the patella, sized for a 38 button and drilled the lollipop. The knee  was then once again hyperflexed exposing the proximal tibia. We sized for a #3 tibial base plate, applied the smokestack and the conical reamer followed by the the Delta fin keel punch. We then hammered into place the Sigma RP trial femoral component, inserted a 10-mm trial bearing, trial patellar button, and took the knee through range of motion from 0-130 degrees. No thumb pressure was required for patellar  tracking. At this point, all trial components were removed, a double batch of DePuy HV cement with 1500 mg of Zinacef was mixed and applied to all bony metallic mating surfaces except for the posterior condyles of the femur itself. In order, we  hammered into place the tibial tray and removed excess cement, the femoral component and removed excess cement, a 10-mm Sigma RP bearing  was inserted, and the knee brought to full extension with compression.  The patellar button was clamped into place, and excess cement  removed. While the cement cured the wound was irrigated out with normal saline solution pulse lavage, and medium Hemovac drains were placed from an anterolateral  approach. Ligament stability and patellar tracking were checked and found to be excellent. The parapatellar arthrotomy was closed with  running #1 Vicryl suture. The subcutaneous tissue with 0 and 2-0 undyed  Vicryl suture, and the skin with skin staples. A dressing of Xeroform,   4 x 4, dressing sponges, Webril, and Ace wrap applied. The patient  awakened, extubated, and taken to recovery room without difficulty.   Marypat Kimmet J 10/26/2014, 8:45 AM

## 2014-10-26 NOTE — Progress Notes (Signed)
Utilization review completed.  

## 2014-10-26 NOTE — Anesthesia Preprocedure Evaluation (Signed)
Anesthesia Evaluation  Patient identified by MRN, date of birth, ID band Patient awake    Reviewed: Allergy & Precautions, H&P , NPO status , Patient's Chart, lab work & pertinent test results  Airway Mallampati: II  TM Distance: >3 FB Neck ROM: Full    Dental no notable dental hx.    Pulmonary asthma , sleep apnea ,  breath sounds clear to auscultation  Pulmonary exam normal       Cardiovascular negative cardio ROS  Rhythm:Regular Rate:Normal     Neuro/Psych negative neurological ROS  negative psych ROS   GI/Hepatic negative GI ROS, Neg liver ROS,   Endo/Other  negative endocrine ROS  Renal/GU negative Renal ROS  negative genitourinary   Musculoskeletal negative musculoskeletal ROS (+)   Abdominal   Peds negative pediatric ROS (+)  Hematology negative hematology ROS (+)   Anesthesia Other Findings   Reproductive/Obstetrics negative OB ROS                             Anesthesia Physical Anesthesia Plan  ASA: II  Anesthesia Plan: General   Post-op Pain Management:    Induction: Intravenous  Airway Management Planned: Oral ETT  Additional Equipment:   Intra-op Plan:   Post-operative Plan: Extubation in OR  Informed Consent: I have reviewed the patients History and Physical, chart, labs and discussed the procedure including the risks, benefits and alternatives for the proposed anesthesia with the patient or authorized representative who has indicated his/her understanding and acceptance.   Dental advisory given  Plan Discussed with: CRNA and Surgeon  Anesthesia Plan Comments:         Anesthesia Quick Evaluation

## 2014-10-26 NOTE — Anesthesia Procedure Notes (Addendum)
Anesthesia Regional Block:  Femoral nerve block  Pre-Anesthetic Checklist: ,, timeout performed, Correct Patient, Correct Site, Correct Laterality, Correct Procedure, Correct Position, site marked, Risks and benefits discussed,  Surgical consent,  Pre-op evaluation,  At surgeon's request and post-op pain management  Laterality: Left  Prep: chloraprep       Needles:  Injection technique: Single-shot  Needle Type: Echogenic Stimulator Needle     Needle Length: 9cm 9 cm Needle Gauge: 21 and 21 G    Additional Needles:  Procedures: ultrasound guided (picture in chart) Femoral nerve block Narrative:  Injection made incrementally with aspirations every 5 mL.  Performed by: Personally   Additional Notes: Patient tolerated the procedure well without complications   Procedure Name: LMA Insertion Date/Time: 10/26/2014 7:35 AM Performed by: [place] Pink Pre-anesthesia Checklist: Patient identified, Emergency Drugs available, Suction available, Patient being monitored and Timeout performed Patient Re-evaluated:Patient Re-evaluated prior to inductionOxygen Delivery Method: Circle system utilized Preoxygenation: Pre-oxygenation with 100% oxygen Intubation Type: IV induction LMA: LMA inserted LMA Size: 4.0 Number of attempts: 1 Placement Confirmation: positive ETCO2 and breath sounds checked- equal and bilateral Tube secured with: Tape Dental Injury: Teeth and Oropharynx as per pre-operative assessment

## 2014-10-26 NOTE — Progress Notes (Signed)
Orthopedic Tech Progress Note Patient Details:  Kristin Roach Mar 15, 1941 [id number]  CPM Left Knee CPM Left Knee: On Left Knee Flexion (Degrees): 60 Left Knee Extension (Degrees): 0 Additional Comments: trapeze bar patient helper   Hildred Priest 10/26/2014, 10:09 AM

## 2014-10-26 NOTE — Evaluation (Signed)
Physical Therapy Evaluation Patient Details Name: Kristin Roach MRN: [id number] DOB: 06-21-1941 Today's Date: 10/26/2014   History of Present Illness  LTKA  Past Medical History  Diagnosis Date  . Complication of anesthesia     woke up during back procedure  . Sleep apnea     cpap  . Asthma     bronchial asthma  . Pneumonia     hx of 2012  . Arthritis   . RLS (restless legs syndrome)      Clinical Impression  Pt is s/p TKA resulting in the deficits listed below (see PT Problem List).  Pt will benefit from skilled PT to increase their independence and safety with mobility to allow discharge to the venue listed below.      Follow Up Recommendations Home health PT    Equipment Recommendations  Rolling walker with 5" wheels;3in1 (PT)    Recommendations for Other Services       Precautions / Restrictions Precautions Precautions: Knee Precaution Comments: Pt educated to not allow any pillow or bolster under knee for healing with optimal range of motion.  Restrictions LLE Weight Bearing: Weight bearing as tolerated      Mobility  Bed Mobility Overal bed mobility: Needs Assistance Bed Mobility: Supine to Sit     Supine to sit: Min assist     General bed mobility comments: Min assist for LLE; cues for technqiue  Transfers Overall transfer level: Needs assistance Equipment used: Rolling walker (2 wheeled) Transfers: Sit to/from Stand Sit to Stand: Min assist         General transfer comment: Cues for technique, safety and hand placement; min assis to control descent with stand to sit  Ambulation/Gait Ambulation/Gait assistance: Min assist Ambulation Distance (Feet):  (pivot steps bed to recliner) Assistive device: Rolling walker (2 wheeled) Gait Pattern/deviations: Step-to pattern     General Gait Details: Cues for gait sequence and to activate L quad for stance stability  Stairs            Wheelchair Mobility    Modified Rankin (Stroke Patients  Only)       Balance Overall balance assessment: No apparent balance deficits (not formally assessed)                                           Pertinent Vitals/Pain Pain Assessment: 0-10 Pain Score: 4  Pain Location: L knee with flexion Pain Descriptors / Indicators: Aching Pain Intervention(s): Monitored during session;Repositioned    Home Living Family/patient expects to be discharged to:: Private residence Living Arrangements: Children Available Help at Discharge: Family;Available PRN/intermittently Type of Home: House Home Access: Stairs to enter Entrance Stairs-Rails: Right Entrance Stairs-Number of Steps: 3 Home Layout: One level Home Equipment: None      Prior Function Level of Independence: Independent               Hand Dominance        Extremity/Trunk Assessment   Upper Extremity Assessment: Overall WFL for tasks assessed           Lower Extremity Assessment: LLE deficits/detail   LLE Deficits / Details: Grossly decr AROM and strength, limited by pain postop; good quad activation     Communication   Communication: No difficulties  Cognition Arousal/Alertness: Awake/alert Behavior During Therapy: WFL for tasks assessed/performed Overall Cognitive Status: Within Functional Limits for tasks assessed  General Comments      Exercises        Assessment/Plan    PT Assessment Patient needs continued PT services  PT Diagnosis Difficulty walking;Acute pain   PT Problem List Decreased strength;Decreased range of motion;Decreased activity tolerance;Decreased balance;Decreased mobility;Decreased knowledge of use of DME;Decreased knowledge of precautions;Pain  PT Treatment Interventions DME instruction;Gait training;Stair training;Functional mobility training;Therapeutic activities;Therapeutic exercise;Patient/family education   PT Goals (Current goals can be found in the Care Plan section) Acute  Rehab PT Goals Patient Stated Goal: to walk without pain PT Goal Formulation: With patient Time For Goal Achievement: 11/02/14 Potential to Achieve Goals: Good    Frequency 7X/week   Barriers to discharge Decreased caregiver support Must be modI to dc to son's home; will likely meet this goal    Co-evaluation               End of Session Equipment Utilized During Treatment: Gait belt Activity Tolerance: Patient tolerated treatment well Patient left: in chair;with call bell/phone within reach Nurse Communication: Mobility status;Other (comment) (and pt requesting to get to bedside commode)         Time: 9371-6967 PT Time Calculation (min): 22 min   Charges:   PT Evaluation $Initial PT Evaluation Tier I: 1 Procedure PT Treatments $Therapeutic Activity: 8-22 mins   PT G Codes:          Quin Hoop 10/26/2014, 4:22 PM  Roney Marion, Caroleen Pager [phone] Office [phone]

## 2014-10-26 NOTE — Progress Notes (Signed)
Patient has an infiltrated in the left hand. New one placed in right hand by CRNA in PACU old one removed with catheter in place.

## 2014-10-26 NOTE — Care Management Note (Signed)
CARE MANAGEMENT NOTE 10/26/2014  Patient:  Kristin Roach, Kristin Roach   Account Number:  [id number]  Date Initiated:  10/26/2014  Documentation initiated by:  Ricki Miller  Subjective/Objective Assessment:   73 yr old nfemale admitted with osteoarthritis of the left knee. Patient had a left total knee arthroplasty.     Action/Plan:   PT/OT eval.  Case manager will continue to monitor.   Anticipated DC Date:     Anticipated DC Plan:  [place] [facility]  CM consult           Status of service:  In process, will continue to follow  Per UR Regulation:  Reviewed for med. necessity/level of care/duration of stay

## 2014-10-26 NOTE — Anesthesia Postprocedure Evaluation (Signed)
  Anesthesia Post-op Note  Patient: Kristin Roach  Procedure(s) Performed: Procedure(s) (LRB): LEFT TOTAL KNEE ARTHROPLASTY (Left)  Patient Location: PACU  Anesthesia Type: GA combined with regional for post-op pain  Level of Consciousness: awake and alert   Airway and Oxygen Therapy: Patient Spontanous Breathing  Post-op Pain: mild  Post-op Assessment: Post-op Vital signs reviewed, Patient's Cardiovascular Status Stable, Respiratory Function Stable, Patent Airway and No signs of Nausea or vomiting  Last Vitals:  Filed Vitals:   10/26/14 0945  BP: 172/78  Pulse: 73  Temp:   Resp: 10    Post-op Vital Signs: stable   Complications: No apparent anesthesia complications

## 2014-10-27 ENCOUNTER — Encounter (HOSPITAL_COMMUNITY): Payer: Self-pay | Admitting: Orthopedic Surgery

## 2014-10-27 LAB — CBC
HEMATOCRIT: 35.4 % — AB (ref 36.0–46.0)
HEMOGLOBIN: 11.1 g/dL — AB (ref 12.0–15.0)
MCH: 28.8 pg (ref 26.0–34.0)
MCHC: 31.4 g/dL (ref 30.0–36.0)
MCV: 91.7 fL (ref 78.0–100.0)
Platelets: 221 10*3/uL (ref 150–400)
RBC: 3.86 MIL/uL — AB (ref 3.87–5.11)
RDW: 14.1 % (ref 11.5–15.5)
WBC: 9.4 10*3/uL (ref 4.0–10.5)

## 2014-10-27 NOTE — Care Management Note (Signed)
CARE MANAGEMENT NOTE 10/27/2014  Patient:  Kristin Roach, Kristin Roach   Account Number:  [id number]  Date Initiated:  10/26/2014  Documentation initiated by:  Ricki Miller  Subjective/Objective Assessment:   73 yr old nfemale admitted with osteoarthritis of the left knee. Patient had a left total knee arthroplasty.     Action/Plan:   PT/OT eval.  Patient preoperatively setup with [facility], no changes. Patient has family support at discharge.   Anticipated DC Date:  10/28/2014   Anticipated DC Plan:  [place]  CM consult      [facility] Choice  HOME HEALTH  DURABLE MEDICAL EQUIPMENT   Choice offered to / List presented to:  C-1 Patient   DME arranged  CPM  WALKER - ROLLING  3-N-1      DME agency  [employer]     [place] arranged  HH-2 PT      [place] agency  [facility]   Status of service:  Completed, signed off Medicare Important Message given?  NA - LOS <3 / Initial given by admissions (If response is "NO", the following Medicare IM given date fields will be blank) Date Medicare IM given:   Medicare IM given by:   Date Additional Medicare IM given:   Additional Medicare IM given by:    Discharge Disposition:  [place]  Per UR Regulation:  Reviewed for med. necessity/level of care/duration of stay  If discussed at [place] of Stay Meetings, dates discussed:    Comments:  10/27/14 12:00 Ricki Miller, RN BSN Case Manager Patient will be staying with her son for recovery :  [place] -- [phone]  [address]  [place], [place] [address]

## 2014-10-27 NOTE — Progress Notes (Signed)
Placed patient on CPAP for the night via auto-mode with minimum pressure set at 6cm and maximum pressure set at 20cm  

## 2014-10-27 NOTE — Plan of Care (Signed)
Problem: Phase I Progression Outcomes Goal: Pain controlled with appropriate interventions Outcome: Completed/Met Date Met:  10/27/14 Goal: Dangle or out of bed evening of surgery Outcome: Completed/Met Date Met:  10/27/14 Goal: Initial discharge plan identified Outcome: Completed/Met Date Met:  10/27/14 Goal: Other Phase I Outcomes/Goals Outcome: Completed/Met Date Met:  10/27/14

## 2014-10-27 NOTE — Progress Notes (Signed)
OT Cancellation Note  Patient Details Name: Kristin Roach MRN: [id number] DOB: [birth year]   Cancelled Treatment:    Reason Eval/Treat Not Completed: Pain limiting ability to participate;Other (comment) (pt declined due to 9/10 pain and nausea. OT to reattempt.)  Hortencia Pilar 10/27/2014, 10:06 AM

## 2014-10-27 NOTE — Progress Notes (Signed)
Patient ID: Kristin Roach, female   DOB: 01-19-1941, 73 y.o.   MRN: [id number] PATIENT ID: Kristin Roach  MRN: [id number]  DOB/AGE:  1941/02/01 / 73 y.o.  1 Day Post-Op Procedure(s) (LRB): LEFT TOTAL KNEE ARTHROPLASTY (Left)    PROGRESS NOTE Subjective: Patient is alert, oriented, x1 Nausea, no Vomiting, yes passing gas, no Bowel Movement. Taking PO well. Denies SOB, Chest or Calf Pain. Using Incentive Spirometer, PAS in place. Ambulate WBAT, CPM 0-40 Patient reports pain as 2 on 0-10 scale  .    Objective: Vital signs in last 24 hours: Filed Vitals:   10/26/14 1123 10/26/14 2041 10/27/14 0020 10/27/14 0519  BP: 144/69 130/60  164/82  Pulse: 71 84 81 105  Temp: 98.5 F (36.9 C) 98.6 F (37 C)  98.1 F (36.7 C)  TempSrc:      Resp: 15 15 16 16   Height:      Weight:      SpO2: 98% 94% 95% 94%      Intake/Output from previous day: I/O last 3 completed shifts: In: 1000 [I.V.:1000] Out: 43 [Drains:450; Blood:40]   Intake/Output this shift:     LABORATORY DATA: No results for input(s): WBC, HGB, HCT, PLT, NA, K, CL, CO2, BUN, CREATININE, GLUCOSE, GLUCAP, INR, CALCIUM in the last 72 hours.  Invalid input(s): PT, 2  Examination: Neurologically intact ABD soft Neurovascular intact Sensation intact distally Intact pulses distally Dorsiflexion/Plantar flexion intact Incision: scant drainage No cellulitis present Compartment soft} CPM was running 0-40, H-Vac was disconnected at junction of small drains to main line. Re-connected to suction with minimal return, DC'd after 10 min. Assessment:   1 Day Post-Op Procedure(s) (LRB): LEFT TOTAL KNEE ARTHROPLASTY (Left) ADDITIONAL DIAGNOSIS: Expected Acute Blood Loss Anemia, sleep apnea  Plan: PT/OT WBAT, CPM 5/hrs day until ROM 0-90 degrees, then D/C CPM DVT Prophylaxis:  SCDx72hrs, ASA 325 mg BID x 2 weeks DISCHARGE PLAN: Home DISCHARGE NEEDS: HHPT, CPM, Walker and 3-in-1 comode seat     Kristin Roach J 10/27/2014, 7:15 AM

## 2014-10-27 NOTE — Evaluation (Signed)
Occupational Therapy Evaluation Patient Details Name: Kristin Roach MRN: [id number] DOB: December 04, 1941 Today's Date: 10/27/2014    History of Present Illness LTKA   Clinical Impression   Pt admitted with the above diagnoses and presents with below problem list. Pt will benefit from continued acute OT to address the below listed deficits and maximize independence with basic ADLs prior to d/c home with family. PTA pt was independent with ADLs. Pt currently at min guard level with use of AE for LB ADLs. Pt performed stand pivot from EOB to recliner at min guard level. Pt limited today by pain, lethargy, and reports feeling "woozy" in standing. OT to continue to follow acutely.       Follow Up Recommendations  Supervision/Assistance - 24 hour;No OT follow up    Equipment Recommendations  3 in 1 bedside comode;Other (comment) (pt reports 3n1 being delivered today)    Recommendations for Other Services       Precautions / Restrictions Precautions Precautions: Knee Precaution Comments: reviewed precautions Restrictions Weight Bearing Restrictions: Yes LLE Weight Bearing: Weight bearing as tolerated      Mobility Bed Mobility Overal bed mobility: Needs Assistance Bed Mobility: Supine to Sit     Supine to sit: Min assist     General bed mobility comments: min A to advance LLE, cues for technique  Transfers Overall transfer level: Needs assistance Equipment used: Rolling walker (2 wheeled) Transfers: Sit to/from Stand Sit to Stand: Min guard         General transfer comment: cues for technique    Balance Overall balance assessment: Needs assistance         Standing balance support: Bilateral upper extremity supported;During functional activity Standing balance-Leahy Scale: Poor Standing balance comment: pt reported feeling "woozy" in standing                            ADL Overall ADL's : Needs assistance/impaired Eating/Feeding: Set up;Sitting    Grooming: Set up;Sitting   Upper Body Bathing: Set up;Sitting   Lower Body Bathing: Min guard;Sit to/from stand   Upper Body Dressing : Set up;Sitting   Lower Body Dressing: Min guard;With adaptive equipment;Sit to/from stand   Toilet Transfer: Stand-pivot;BSC;RW   Toileting- Water quality scientist and Hygiene: Min guard;Sit to/from stand   Tub/ Shower Transfer: Stand-pivot;3 in Art therapist mobility during ADLs: Min guard;Rolling walker General ADL Comments: Educated pt on techniques and AE for safe completion of ADLs with knee precautions. Discussed shower setup as detailed below.      Vision                     Perception     Praxis      Pertinent Vitals/Pain Pain Assessment: 0-10 Pain Score: 7  Pain Location: Lt knee Pain Descriptors / Indicators: Aching Pain Intervention(s): Limited activity within patient's tolerance;Monitored during session;Repositioned;Utilized relaxation techniques     Hand Dominance     Extremity/Trunk Assessment Upper Extremity Assessment Upper Extremity Assessment: Overall WFL for tasks assessed   Lower Extremity Assessment Lower Extremity Assessment: Defer to PT evaluation       Communication Communication Communication: No difficulties   Cognition Arousal/Alertness: Lethargic;Suspect due to medications Behavior During Therapy: [facility] for tasks assessed/performed Overall Cognitive Status: Within Functional Limits for tasks assessed                     General Comments  Exercises       Shoulder Instructions      Home Living Family/patient expects to be discharged to:: Private residence Living Arrangements: Children Available Help at Discharge: Family;Available PRN/intermittently Type of Home: House Home Access: Stairs to enter [employer] of Steps: 3 Entrance Stairs-Rails: Right Home Layout: Two level;Able to live on main level with bedroom/bathroom;1/2 bath on main  level;Other (Comment) (plans to use sponge bath for awhile )     Bathroom Shower/Tub: Teacher, early years/pre: Standard Bathroom Accessibility: Yes How Accessible: Accessible via walker Home Equipment: Other (comment) (Recommended 3n1 to be delviered today per pt report)   Additional Comments: Pt shower is up a full flight of stairs. She plans to sponge bathe for while and then likely got o daughter's house 2 blocks away to use her main level tub/shower combo. Discussed having 3n1 for shower transfer.      Prior Functioning/Environment Level of Independence: Independent             OT Diagnosis: Acute pain   OT Problem List: Impaired balance (sitting and/or standing);Decreased knowledge of use of DME or AE;Decreased knowledge of precautions;Pain   OT Treatment/Interventions: Self-care/ADL training;DME and/or AE instruction;Therapeutic activities;Patient/family education;Balance training    OT Goals(Current goals can be found in the care plan section) Acute Rehab OT Goals Patient Stated Goal: to walk without pain OT Goal Formulation: With patient Time For Goal Achievement: 08-Nov-2014 Potential to Achieve Goals: Good ADL Goals Pt Will Perform Lower Body Dressing: with supervision;with adaptive equipment;sit to/from stand Pt Will Transfer to Toilet: with supervision;ambulating (3n1 over toilet) Pt Will Perform Toileting - Clothing Manipulation and hygiene: with supervision;sit to/from stand Additional ADL Goal #1: Pt will complete sit<>supine at mod I level to prepare for OOB ADLs.  OT Frequency: Min 2X/week   Barriers to D/C:            Co-evaluation              End of Session Equipment Utilized During Treatment: Gait belt;Rolling walker  Activity Tolerance: Patient limited by lethargy;Patient tolerated treatment well;Patient limited by pain Patient left: in chair;with call bell/phone within reach   Time: 1323-1353 OT Time Calculation (min): 30  min Charges:  OT General Charges $OT Visit: 1 Procedure OT Evaluation $Initial OT Evaluation Tier I: 1 Procedure OT Treatments $Self Care/Home Management : 23-37 mins G-Codes:    Hortencia Pilar 08-Nov-2014, 2:09 PM

## 2014-10-27 NOTE — Progress Notes (Signed)
PT Cancellation Note  Patient Details Name: Kristin Roach MRN: [id number] DOB: Apr 30, 1941   Cancelled Treatment:    Reason Eval/Treat Not Completed: Medical issues which prohibited therapy. Attempted to see patient for BID treatment however patient in bed due to becoming nauseated and vomiting after PT session this morning. Will follow up with patient again this afternoon and see if at that time patient can participate in second PT session   Chelle Cayton, Daleyza Gadomski 10/27/2014, 12:01 PM

## 2014-10-27 NOTE — Progress Notes (Signed)
Physical Therapy Treatment Patient Details Name: Kristin Roach MRN: [id number] DOB: February 02, 1941 Today's Date: 10/27/2014    History of Present Illness LTKA    PT Comments    Patient progressing with ambulation this session and able to walk in hallway. Completed HEP. L knee appears steady with gait and no buckling noted.   Follow Up Recommendations  Home health PT     Equipment Recommendations  Rolling walker with 5" wheels;3in1 (PT)    Recommendations for Other Services       Precautions / Restrictions Precautions Precautions: Knee Restrictions Weight Bearing Restrictions: Yes LLE Weight Bearing: Weight bearing as tolerated    Mobility  Bed Mobility   Bed Mobility: Supine to Sit     Supine to sit: Min guard     General bed mobility comments:  cues for technqiue  Transfers Overall transfer level: Needs assistance Equipment used: Rolling walker (2 wheeled)   Sit to Stand: Min guard         General transfer comment: Cues for technique, safety and hand placement;   Ambulation/Gait Ambulation/Gait assistance: Min guard Ambulation Distance (Feet): 60 Feet Assistive device: Rolling walker (2 wheeled) Gait Pattern/deviations: Step-to pattern;Decreased stance time - left;Decreased step length - right   Gait velocity interpretation: Below normal speed for age/gender General Gait Details: Cues for gait sequence and to activate L quad for stance stability   Stairs            Wheelchair Mobility    Modified Rankin (Stroke Patients Only)       Balance                                    Cognition Arousal/Alertness: Awake/alert Behavior During Therapy: WFL for tasks assessed/performed Overall Cognitive Status: Within Functional Limits for tasks assessed                      Exercises Total Joint Exercises Ankle Circles/Pumps: AROM;Both;10 reps Quad Sets: AROM;Left;10 reps Heel Slides: AAROM;Left;10 reps Straight Leg Raises:  AAROM;Left;10 reps Long Arc Quad: AAROM;Left;10 reps    General Comments        Pertinent Vitals/Pain Pain Score: 4  Pain Location: l knee Pain Descriptors / Indicators: Sore Pain Intervention(s): Monitored during session;Repositioned    Home Living                      Prior Function            PT Goals (current goals can now be found in the care plan section) Progress towards PT goals: Progressing toward goals    Frequency  7X/week    PT Plan Current plan remains appropriate    Co-evaluation             End of Session Equipment Utilized During Treatment: Gait belt Activity Tolerance: Patient tolerated treatment well Patient left: in chair;with call bell/phone within reach     Time: 0816-0855 PT Time Calculation (min) (ACUTE ONLY): 39 min  Charges:  $Gait Training: 8-22 mins $Therapeutic Exercise: 8-22 mins $Therapeutic Activity: 8-22 mins                    G Codes:      Gurpreet, Mariani 10/27/2014, 9:03 AM 10/27/2014 Jacqualyn Posey PTA [phone] pager [phone] office

## 2014-10-28 ENCOUNTER — Encounter (HOSPITAL_COMMUNITY): Payer: Self-pay | Admitting: General Practice

## 2014-10-28 LAB — CBC
HEMATOCRIT: 34.3 % — AB (ref 36.0–46.0)
Hemoglobin: 11 g/dL — ABNORMAL LOW (ref 12.0–15.0)
MCH: 28.8 pg (ref 26.0–34.0)
MCHC: 32.1 g/dL (ref 30.0–36.0)
MCV: 89.8 fL (ref 78.0–100.0)
Platelets: 222 10*3/uL (ref 150–400)
RBC: 3.82 MIL/uL — AB (ref 3.87–5.11)
RDW: 13.9 % (ref 11.5–15.5)
WBC: 11.7 10*3/uL — AB (ref 4.0–10.5)

## 2014-10-28 NOTE — Progress Notes (Signed)
PATIENT ID: Kristin Roach  MRN: [id number]  DOB/AGE:  Mar 07, 1941 / 73 y.o.  2 Days Post-Op Procedure(s) (LRB): LEFT TOTAL KNEE ARTHROPLASTY (Left)    PROGRESS NOTE Subjective: Patient is alert, oriented, yes Nausea, no Vomiting today, yes passing gas, no Bowel Movement. Taking PO sips and small bites. Denies SOB, Chest or Calf Pain. Using Incentive Spirometer, PAS in place. Ambulate WBAT, CPM 0-60 Patient reports pain as 8 on 0-10 scale  .    Objective: Vital signs in last 24 hours: Filed Vitals:   10/27/14 2118 10/27/14 2140 10/28/14 0000 10/28/14 0500  BP:    125/59  Pulse:  74  100  Temp:    98.6 F (37 C)  TempSrc:      Resp:  16 17 16   Height:      Weight:      SpO2: 94% 95%  90%      Intake/Output from previous day: I/O last 3 completed shifts: In: 920 [P.O.:920] Out: 300 [Drains:300]   Intake/Output this shift:     LABORATORY DATA:  Recent Labs  10/27/14 0735 10/28/14 0525  WBC 9.4 11.7*  HGB 11.1* 11.0*  HCT 35.4* 34.3*  PLT 221 222    Examination: Neurologically intact Neurovascular intact Sensation intact distally Intact pulses distally Dorsiflexion/Plantar flexion intact Incision: dressing C/D/I No cellulitis present Compartment soft}  Assessment:   2 Days Post-Op Procedure(s) (LRB): LEFT TOTAL KNEE ARTHROPLASTY (Left) ADDITIONAL DIAGNOSIS: Expected Acute Blood Loss Anemia, Sleep Apnea  Plan: PT/OT WBAT, CPM 5/hrs day until ROM 0-90 degrees, then D/C CPM DVT Prophylaxis:  SCDx72hrs, ASA 325 mg BID x 2 weeks DISCHARGE PLAN: Home, likely tomorrow. DISCHARGE NEEDS: HHPT, HHRN, CPM, Walker and 3-in-1 comode seat     Norwin Aleman R 10/28/2014, 7:45 AM

## 2014-10-28 NOTE — Progress Notes (Signed)
Orthopedic Tech Progress Note Patient Details:  Kristin Roach 02-Apr-1941 [id number]  Patient ID: Kristin Roach, female   DOB: March 31, 1941, 73 y.o.   MRN: [id number] Placed pt's lle in cpm @ 0-45 degrees @1500 ; will increase as pt tolerates; rn notified  Hildred Priest 10/28/2014, 2:57 PM

## 2014-10-28 NOTE — Progress Notes (Signed)
Physical Therapy Treatment Patient Details Name: BERDINA CHEEVER MRN: [id number] DOB: 12/29/1940 Today's Date: 10/28/2014    History of Present Illness LTKA    PT Comments    Very good progress; on track for dc home tomorrow  Follow Up Recommendations  Home health PT     Equipment Recommendations  Rolling walker with 5" wheels;3in1 (PT)    Recommendations for Other Services       Precautions / Restrictions Precautions Precautions: Knee Precaution Comments: Pt educated to not allow any pillow or bolster under knee for healing with optimal range of motion.  Restrictions LLE Weight Bearing: Weight bearing as tolerated    Mobility  Bed Mobility Overal bed mobility: Needs Assistance Bed Mobility: Supine to Sit     Supine to sit: Supervision     General bed mobility comments: Cues for technique; no need for physical asssist  Transfers Overall transfer level: Needs assistance Equipment used: Rolling walker (2 wheeled) Transfers: Sit to/from Stand Sit to Stand: Supervision         General transfer comment: Cues for technique, safety and hand placement; Able to stand without physical assist  Ambulation/Gait Ambulation/Gait assistance: Supervision Ambulation Distance (Feet): 120 Feet Assistive device: Rolling walker (2 wheeled) Gait Pattern/deviations: Step-through pattern Gait velocity: slowed   General Gait Details: Cues for gait sequence and to activate L quad for stance stability   Stairs            Wheelchair Mobility    Modified Rankin (Stroke Patients Only)       Balance                                    Cognition Arousal/Alertness: Awake/alert Behavior During Therapy: WFL for tasks assessed/performed Overall Cognitive Status: Within Functional Limits for tasks assessed                      Exercises Total Joint Exercises Ankle Circles/Pumps: AROM;Both;10 reps Quad Sets: AROM;Left;10 reps Short Arc Quad:  AROM;Left;10 reps Heel Slides: AAROM;Left;10 reps Hip ABduction/ADduction: AROM;Left;10 reps Straight Leg Raises: AAROM;AROM;Left;10 reps    General Comments        Pertinent Vitals/Pain Pain Assessment: 0-10 Pain Score: 5  Pain Location: L knee Pain Descriptors / Indicators: Aching Pain Intervention(s): Monitored during session    Home Living                      Prior Function            PT Goals (current goals can now be found in the care plan section) Acute Rehab PT Goals Patient Stated Goal: walk without pain PT Goal Formulation: With patient Time For Goal Achievement: 11/02/14 Potential to Achieve Goals: Good Progress towards PT goals: Progressing toward goals    Frequency  7X/week    PT Plan Current plan remains appropriate    Co-evaluation             End of Session Equipment Utilized During Treatment: Gait belt Activity Tolerance: Patient tolerated treatment well Patient left: in chair;with call bell/phone within reach     Time: 4081-4481 PT Time Calculation (min) (ACUTE ONLY): 26 min  Charges:  $Gait Training: 8-22 mins $Therapeutic Exercise: 8-22 mins                    G Codes:      Roney Marion [place]  10/28/2014, 4:58 PM  Roney Marion, [place] Pager [phone] Office [phone]

## 2014-10-28 NOTE — Progress Notes (Signed)
Occupational Therapy Treatment Patient Details Name: Kristin Roach MRN: [id number] DOB: 02/25/1941 Today's Date: 10/28/2014    History of present illness LTKA   OT comments  Pt seen for acute OT treatment session. Pt declined out of recliner mobility due to feeling "woozy" and in pain. Noted decrease in lethargy from yesterday's session with pt observed to be more alert. However, pt reports feeling "woozy" has intensified. Educated and demonstrated AE for LB ADLs. Discussed at length safety at home especially during gaps of time (~3 hours) where she will be home alone while son is at work. Pt reports she ambulated household distances this morning with PT and feels okay with ambulating to bathroom for toilet transfers. Also educated pt on use of 3n1 as BSC as needed and waiting to complete OOB/mobility task until son is home as much as possible.   Follow Up Recommendations  Other (comment) (Supervision for OOB/mobility)    Equipment Recommendations  3 in 1 bedside comode;Other (comment) (3n1 has been delivered)    Recommendations for Other Services      Precautions / Restrictions Precautions Precautions: Knee Precaution Comments: reviewed precautions Restrictions Weight Bearing Restrictions: Yes LLE Weight Bearing: Weight bearing as tolerated       Mobility Bed Mobility               General bed mobility comments: pt in recliner  Transfers                 General transfer comment: pt declined    Balance                                   ADL                                         General ADL Comments: Pt declined our of recliner mobility due to feeling "woozy" and in pain. Noted decrease in lethargy from yesterday's session. However, pt reports feeling "woozy" has intensified. Educated and demonstrated AE for LB ADLs. Discussed at length safety at home especially during gaps of time (3 hours) where she will be home alone. Pt  reports she ambulated household distances this morning with PT and feels okay with ambulating to bathroom for toilet transfers. Also, educated pt on use of 3n1 as BSC as needed.      Vision                     Perception     Praxis      Cognition   Behavior During Therapy: WFL for tasks assessed/performed Overall Cognitive Status: Within Functional Limits for tasks assessed                       Extremity/Trunk Assessment               Exercises     Shoulder Instructions       General Comments      Pertinent Vitals/ Pain       Pain Assessment: 0-10 Pain Score: 8  Pain Location: Lt knee Pain Descriptors / Indicators: Aching Pain Intervention(s): Limited activity within patient's tolerance;Monitored during session  Home Living Family/patient expects to be discharged to:: Other (Comment) (rehab) Living Arrangements: Alone  Prior Functioning/Environment              Frequency Min 2X/week     Progress Toward Goals  OT Goals(current goals can now be found in the care plan section)  Progress towards OT goals: Progressing toward goals  Acute Rehab OT Goals Patient Stated Goal: to walk without pain OT Goal Formulation: With patient Time For Goal Achievement: 10/27/14 Potential to Achieve Goals: Good ADL Goals Pt Will Perform Lower Body Dressing: with supervision;with adaptive equipment;sit to/from stand Pt Will Transfer to Toilet: with supervision;ambulating Pt Will Perform Toileting - Clothing Manipulation and hygiene: with supervision;sit to/from stand Additional ADL Goal #1: Pt will complete sit<>supine at mod I level to prepare for OOB ADLs.  Plan Discharge plan remains appropriate    Co-evaluation                 End of Session     Activity Tolerance Patient limited by pain;Other (comment) (Pt limited by feeling "woozy" )   Patient Left in chair;with call bell/phone  within reach;with nursing/sitter in room   Nurse Communication          Time: 5400-8676 OT Time Calculation (min): 18 min  Charges: OT General Charges $OT Visit: 1 Procedure OT Treatments $Self Care/Home Management : 8-22 mins  Hortencia Pilar 10/28/2014, 12:07 PM

## 2014-10-28 NOTE — Progress Notes (Signed)
Physical Therapy Treatment Patient Details Name: Kristin Roach MRN: [id number] DOB: 1941/01/22 Today's Date: 10/28/2014    History of Present Illness LTKA    PT Comments    Making progress with mobility and amb, though still quite painful and feeling weak (unable to keep food down); Knee is nice and stable in stance  Follow Up Recommendations  Home health PT     Equipment Recommendations  Rolling walker with 5" wheels;3in1 (PT)    Recommendations for Other Services       Precautions / Restrictions Precautions Precautions: Knee Precaution Comments: Pt educated to not allow any pillow or bolster under knee for healing with optimal range of motion.  Restrictions Weight Bearing Restrictions: Yes LLE Weight Bearing: Weight bearing as tolerated    Mobility  Bed Mobility Overal bed mobility: Needs Assistance Bed Mobility: Supine to Sit     Supine to sit: Min guard     General bed mobility comments: Cues for technique; no need for physical asssist  Transfers Overall transfer level: Needs assistance Equipment used: Rolling walker (2 wheeled) Transfers: Sit to/from Stand Sit to Stand: Min guard         General transfer comment: Cues for technique, safety and hand placement; Able to stand from low mat table  Ambulation/Gait Ambulation/Gait assistance: Supervision Ambulation Distance (Feet): 60 Feet (amb ended due to nausea, not knee pain) Assistive device: Rolling walker (2 wheeled) Gait Pattern/deviations: Step-through pattern Gait velocity: slowed   General Gait Details: Cues for gait sequence and to activate L quad for stance stability   Stairs Stairs: Yes Stairs assistance: Min guard Stair Management: One rail Left;Step to pattern;Sideways Number of Stairs: 2 General stair comments: Verbal and demo cues for sequence and technique  Wheelchair Mobility    Modified Rankin (Stroke Patients Only)       Balance                                     Cognition Arousal/Alertness: Awake/alert Behavior During Therapy: WFL for tasks assessed/performed Overall Cognitive Status: Within Functional Limits for tasks assessed                      Exercises      General Comments        Pertinent Vitals/Pain Pain Assessment: 0-10 Pain Score: 8  Pain Location: L knee, and pt with headache Pain Descriptors / Indicators: Aching Pain Intervention(s): Monitored during session;Patient requesting pain meds-RN notified    Home Living                      Prior Function            PT Goals (current goals can now be found in the care plan section) Acute Rehab PT Goals Patient Stated Goal: Be in [place] for Christmas PT Goal Formulation: With patient Time For Goal Achievement: 11/02/14 Potential to Achieve Goals: Good Progress towards PT goals: Progressing toward goals    Frequency  7X/week    PT Plan Current plan remains appropriate    Co-evaluation             End of Session Equipment Utilized During Treatment: Gait belt Activity Tolerance: Patient tolerated treatment well Patient left: in chair;with call bell/phone within reach     Time: 1003-1036 PT Time Calculation (min) (ACUTE ONLY): 33 min  Charges:  $Gait Training: 23-37 mins  G Codes:      Roney Marion [place] 10/28/2014, 12:52 PM  Roney Marion, [place] Pager [phone] Office [phone]

## 2014-10-28 NOTE — Plan of Care (Signed)
Problem: Phase II Progression Outcomes Goal: Ambulates Outcome: Completed/Met Date Met:  10/28/14 Goal: Tolerating diet Outcome: Progressing Goal: Discharge plan established Outcome: Completed/Met Date Met:  10/28/14  Problem: Phase III Progression Outcomes Goal: Pain controlled on oral analgesia Outcome: Completed/Met Date Met:  10/28/14 Goal: Ambulates Outcome: Completed/Met Date Met:  10/28/14

## 2014-10-29 LAB — CBC
HEMATOCRIT: 35.5 % — AB (ref 36.0–46.0)
HEMOGLOBIN: 11 g/dL — AB (ref 12.0–15.0)
MCH: 28.9 pg (ref 26.0–34.0)
MCHC: 31 g/dL (ref 30.0–36.0)
MCV: 93.4 fL (ref 78.0–100.0)
Platelets: 262 10*3/uL (ref 150–400)
RBC: 3.8 MIL/uL — ABNORMAL LOW (ref 3.87–5.11)
RDW: 14.2 % (ref 11.5–15.5)
WBC: 12.7 10*3/uL — ABNORMAL HIGH (ref 4.0–10.5)

## 2014-10-29 NOTE — Progress Notes (Signed)
Physical Therapy Treatment Patient Details Name: ANETA HENDERSHOTT MRN: [id number] DOB: [birth year] Today's Date: 10/29/2014    History of Present Illness LTKA    PT Comments    Reiterated to pt the correct sequence/technique for ascending/descending steps and educated her on the need to have assist from her son.  Pt. With no issues with dizziness this session and BP was 133/62.  Dr. Mayer Camel had indicated she could DC if BP stable and asymptomatic.  Progressing with PT and will follow while she is in house or until goals fully met.   Follow Up Recommendations  Home health PT     Equipment Recommendations  Rolling walker with 5" wheels;3in1 (PT)    Recommendations for Other Services       Precautions / Restrictions Precautions Precautions: Knee Precaution Comments: ongoing review of exercises on TKA sheet Restrictions Weight Bearing Restrictions: Yes LLE Weight Bearing: Weight bearing as tolerated    Mobility  Bed Mobility Overal bed mobility:  (not tested, pt. in recliner)                Transfers Overall transfer level: Needs assistance Equipment used: Rolling walker (2 wheeled) Transfers: Sit to/from Stand Sit to Stand: Supervision         General transfer comment: pt. able to consistently use good hand positioning this session for sit<-> stand  Ambulation/Gait Ambulation/Gait assistance: Modified independent (Device/Increase time) Ambulation Distance (Feet): 250 Feet Assistive device: Rolling walker (2 wheeled)   Gait velocity: slowed   General Gait Details: improving sequence   Stairs Stairs: Yes Stairs assistance: Min guard Stair Management: One rail Left;Step to pattern;Sideways Number of Stairs: 3 General stair comments: continues to need reminder for correct technique on steps with one rail  Wheelchair Mobility    Modified Rankin (Stroke Patients Only)       Balance                                    Cognition  Arousal/Alertness: Awake/alert Behavior During Therapy: WFL for tasks assessed/performed Overall Cognitive Status: Within Functional Limits for tasks assessed                      Exercises      General Comments        Pertinent Vitals/Pain Pain Assessment: 0-10 Pain Score: 4  Pain Location: left knee Pain Descriptors / Indicators: Aching Pain Intervention(s): Monitored during session;Repositioned  BP following walk 133/62    Home Living                      Prior Function            PT Goals (current goals can now be found in the care plan section) Progress towards PT goals: Progressing toward goals    Frequency  7X/week    PT Plan Current plan remains appropriate    Co-evaluation             End of Session Equipment Utilized During Treatment: Gait belt Activity Tolerance: Patient tolerated treatment well Patient left: in chair;with call bell/phone within reach     Time: 1219-1242 PT Time Calculation (min) (ACUTE ONLY): 23 min  Charges:  $Gait Training: 23-37 mins                    G Codes:      Ladona Ridgel 10/29/2014,  1:46 PM Gerlean Ren PT Acute Rehab Services [phone] [place] [phone]

## 2014-10-29 NOTE — Progress Notes (Signed)
Orthopedic Tech Progress Note Patient Details:  Kristin Roach 03/17/1941 [id number] On cpm at 2:55 pm Patient ID: [name], female   DOB: November 24, 1941, 73 y.o.   MRN: [id number]   [name] 10/29/2014, 2:55 PM

## 2014-10-29 NOTE — Discharge Instructions (Signed)
TOTAL KNEE REPLACEMENT POSTOPERATIVE DIRECTIONS ° ° ° °Knee Rehabilitation, Guidelines Following Surgery  °Results after knee surgery are often greatly improved when you follow the exercise, range of motion and muscle strengthening exercises prescribed by your doctor. Safety measures are also important to protect the knee from further injury. Any time any of these exercises cause you to have increased pain or swelling in your knee joint, decrease the amount until you are comfortable again and slowly increase them. If you have problems or questions, call your caregiver or physical therapist for advice.  ° °HOME CARE INSTRUCTIONS  °Remove items at home which could result in a fall. This includes throw rugs or furniture in walking pathways.  °Continue medications as instructed at time of discharge. °You may have some home medications which will be placed on hold until you complete the course of blood thinner medication.  °You may start showering once you are discharged home but do not submerge the incision under water. Just pat the incision dry and apply a dry gauze dressing on daily. °Walk with walker as instructed.  °You may resume a sexual relationship in one month or when given the OK by  your doctor.  °· Use walker as long as suggested by your caregivers. °· Avoid periods of inactivity such as sitting longer than an hour when not asleep. This helps prevent blood clots.  °You may put full weight on your legs and walk as much as is comfortable.  °You may return to work once you are cleared by your doctor.  °Do not drive a car for 6 weeks or until released by you surgeon.  °· Do not drive while taking narcotics.  °Wear the elastic stockings for three weeks following surgery during the day but you may remove then at night. °Make sure you keep all of your appointments after your operation with all of your doctors and caregivers. You should call the office at the above phone number and make an appointment for  approximately two weeks after the date of your surgery. °Change the dressing daily and reapply a dry dressing each time. °Please pick up a stool softener and laxative for home use as long as you are requiring pain medications. °· Continue to use ice on the knee for pain and swelling from surgery. You may notice swelling that will progress down to the foot and ankle.  This is normal after surgery.  Elevate the leg when you are not up walking on it.   °It is important for you to complete the blood thinner medication as prescribed by your doctor. °· Continue to use the breathing machine which will help keep your temperature down.  It is common for your temperature to cycle up and down following surgery, especially at night when you are not up moving around and exerting yourself.  The breathing machine keeps your lungs expanded and your temperature down. ° °RANGE OF MOTION AND STRENGTHENING EXERCISES  °Rehabilitation of the knee is important following a knee injury or an operation. After just a few days of immobilization, the muscles of the thigh which control the knee become weakened and shrink (atrophy). Knee exercises are designed to build up the tone and strength of the thigh muscles and to improve knee motion. Often times heat used for twenty to thirty minutes before working out will loosen up your tissues and help with improving the range of motion but do not use heat for the first two weeks following surgery. These exercises can be done   on a training (exercise) mat, on the floor, on a table or on a bed. Use what ever works the best and is most comfortable for you Knee exercises include:  °Leg Lifts - While your knee is still immobilized in a splint or cast, you can do straight leg raises. Lift the leg to 60 degrees, hold for 3 sec, and slowly lower the leg. Repeat 10-20 times 2-3 times daily. Perform this exercise against resistance later as your knee gets better.  °Quad and Hamstring Sets - Tighten up the muscle  on the front of the thigh (Quad) and hold for 5-10 sec. Repeat this 10-20 times hourly. Hamstring sets are done by pushing the foot backward against an object and holding for 5-10 sec. Repeat as with quad sets.  °A rehabilitation program following serious knee injuries can speed recovery and prevent re-injury in the future due to weakened muscles. Contact your doctor or a physical therapist for more information on knee rehabilitation.  ° °SKILLED REHAB INSTRUCTIONS: °If the patient is transferred to a skilled rehab facility following release from the hospital, a list of the current medications will be sent to the facility for the patient to continue.  When discharged from the skilled rehab facility, please have the facility set up the patient's Home Health Physical Therapy prior to being released. Also, the skilled facility will be responsible for providing the patient with their medications at time of release from the facility to include their pain medication, the muscle relaxants, and their blood thinner medication. If the patient is still at the rehab facility at time of the two week follow up appointment, the skilled rehab facility will also need to assist the patient in arranging follow up appointment in our office and any transportation needs. ° °MAKE SURE YOU:  °Understand these instructions.  °Will watch your condition.  °Will get help right away if you are not doing well or get worse.  ° ° °Pick up stool softner and laxative for home. °Do not submerge incision under water. °May shower. °Continue to use ice for pain and swelling from surgery. ° °

## 2014-10-29 NOTE — Discharge Summary (Signed)
Patient ID: NYESHA CLIFF MRN: [id number] DOB/AGE: [birth year] 73 y.o.  Admit date: 10/26/2014 Discharge date: 10/29/2014  Admission Diagnoses:  Principal Problem:   Arthritis of left knee Primary   Discharge Diagnoses:  Same  Past Medical History  Diagnosis Date  . Complication of anesthesia     woke up during back procedure  . Sleep apnea     cpap  . Asthma     bronchial asthma  . Pneumonia     hx of 2012  . Arthritis   . RLS (restless legs syndrome)   . PONV (postoperative nausea and vomiting)     with knee surgery in 11 2015    Surgeries: Procedure(s): LEFT TOTAL KNEE ARTHROPLASTY on 10/26/2014   Consultants:    Discharged Condition: Improved  Hospital Course: MARSA MATTEO is an 73 y.o. female who was admitted 10/26/2014 for operative treatment ofArthritis of left knee. Patient has severe unremitting pain that affects sleep, daily activities, and work/hobbies. After pre-op clearance the patient was taken to the operating room on 10/26/2014 and underwent  Procedure(s): LEFT TOTAL KNEE ARTHROPLASTY.    Patient was given perioperative antibiotics: Anti-infectives    Start     Dose/Rate Route Frequency Ordered Stop   10/26/14 0811  cefUROXime (ZINACEF) injection  Status:  Discontinued       As needed 10/26/14 0811 10/26/14 0907   10/26/14 0600  ceFAZolin (ANCEF) IVPB 2 g/50 mL premix     2 g100 mL/hr over 30 Minutes Intravenous On call to O.R. 10/25/14 1435 10/26/14 0739       Patient was given sequential compression devices, early ambulation, and chemoprophylaxis to prevent DVT.  Patient benefited maximally from hospital stay and there were no complications.    Recent vital signs: Patient Vitals for the past 24 hrs:  BP Temp Pulse Resp SpO2  10/29/14 0847 - - - - 96 %  10/29/14 0622 (!) 92/54 mmHg - - - 95 %  10/29/14 0606 (!) 93/55 mmHg 98.6 F (37 C) (!) 107 16 90 %  10/29/14 0400 - - - 16 93 %  10/29/14 0000 - - - 16 93 %  10/28/14 2042 133/65 mmHg 98.4 F  (36.9 C) (!) 114 16 93 %  10/28/14 2000 - - - 16 93 %  10/28/14 1254 (!) 104/52 mmHg 98.1 F (36.7 C) (!) 107 16 90 %     Recent laboratory studies:  Recent Labs  10/28/14 0525 10/29/14 0507  WBC 11.7* 12.7*  HGB 11.0* 11.0*  HCT 34.3* 35.5*  PLT 222 262     Discharge Medications:     Medication List    STOP taking these medications        HYDROcodone-acetaminophen 5-325 MG per tablet  Commonly known as:  NORCO/VICODIN  Replaced by:  HYDROcodone-acetaminophen 10-325 MG per tablet     meloxicam 7.5 MG tablet  Commonly known as:  MOBIC      TAKE these medications        aspirin EC 325 MG tablet  Take 1 tablet (325 mg total) by mouth 2 (two) times daily.     budesonide-formoterol 80-4.5 MCG/ACT inhaler  Commonly known as:  SYMBICORT  Inhale 2 puffs into the lungs 2 (two) times daily.     GABAPENTIN PO  Take 1 capsule by mouth 3 (three) times daily.     HYDROcodone-acetaminophen 10-325 MG per tablet  Commonly known as:  NORCO  Take 1 tablet by mouth every 6 (six) hours as needed  for severe pain.     Melatonin 10 MG Caps  Take 10 mg by mouth at bedtime as needed (sleep).     rOPINIRole 1 MG tablet  Commonly known as:  REQUIP  Take 1 mg by mouth at bedtime.     tizanidine 2 MG capsule  Commonly known as:  ZANAFLEX  Take 1 capsule (2 mg total) by mouth 3 (three) times daily.        Diagnostic Studies: No results found.  Disposition: Final discharge disposition not confirmed      Discharge Instructions    CPM    Complete by:  As directed   Continuous passive motion machine (CPM):      Use the CPM from 0 to 60 for 5 hours per day.      You may increase by 10 per day.  You may break it up into 2 or 3 sessions per day.      Use CPM for 1-2 weeks or until you are told to stop.     Call MD / Call 911    Complete by:  As directed   If you experience chest pain or shortness of breath, CALL 911 and be transported to the hospital emergency room.  If you  develope a fever above 101 F, pus (white drainage) or increased drainage or redness at the wound, or calf pain, call your surgeon's office.     Change dressing    Complete by:  As directed   Change dressing on PRN     Constipation Prevention    Complete by:  As directed   Drink plenty of fluids.  Prune juice may be helpful.  You may use a stool softener, such as Colace (over the counter) 100 mg twice a day.  Use MiraLax (over the counter) for constipation as needed.     Diet - low sodium heart healthy    Complete by:  As directed      Increase activity slowly as tolerated    Complete by:  As directed            Follow-up Information    Follow up with Kerin Salen, MD In 2 weeks.   Specialty:  Orthopedic Surgery   Contact information:   [place] [address] [phone]       Follow up with [facility].   Why:  Someone from [facility] will contact you concerning start date and time for physical therapy.   Contact information:   [address]  [place] [address] [phone]       Follow up with Kerin Salen, MD In 1 week.   Specialty:  Orthopedic Surgery   Contact information:   [place] [address] [phone]        Signed: Kerin Salen 10/29/2014, 8:58 AM

## 2014-10-29 NOTE — Progress Notes (Signed)
Patient provided with discharge instructions and follow up information. She is going home with HHPT set up by [facility]. She has plenty of family support from her family, and will be at home with her son.

## 2014-10-29 NOTE — Progress Notes (Signed)
Patient ID: Kristin Roach, female   DOB: 01-Feb-1941, 73 y.o.   MRN: [id number] PATIENT ID: Kristin Roach  MRN: [id number]  DOB/AGE:  [birth year] / 73 y.o.  3 Days Post-Op Procedure(s) (LRB): LEFT TOTAL KNEE ARTHROPLASTY (Left)    PROGRESS NOTE Subjective: Patient is alert, oriented, no Nausea, no Vomiting, yes passing gas, no Bowel Movement. Taking PO well. Denies SOB, Chest or Calf Pain. Using Incentive Spirometer, PAS in place. Ambulate 120 ft, has done steps, CPM 0-60 Patient reports pain as 3 on 0-10 scale  .    Objective: Vital signs in last 24 hours: Filed Vitals:   10/29/14 0000 10/29/14 0400 10/29/14 0606 10/29/14 0622  BP:   93/55 92/54  Pulse:   107   Temp:   98.6 F (37 C)   TempSrc:      Resp: 16 16 16    Height:      Weight:      SpO2: 93% 93% 90% 95%      Intake/Output from previous day: I/O last 3 completed shifts: In: 720 [P.O.:720] Out: -    Intake/Output this shift:     LABORATORY DATA:  Recent Labs  10/28/14 0525 10/29/14 0507  WBC 11.7* 12.7*  HGB 11.0* 11.0*  HCT 34.3* 35.5*  PLT 222 262    Examination: Neurologically intact ABD soft Neurovascular intact Sensation intact distally Intact pulses distally Dorsiflexion/Plantar flexion intact Incision: dressing C/D/I No cellulitis present Compartment soft}  Assessment:   3 Days Post-Op Procedure(s) (LRB): LEFT TOTAL KNEE ARTHROPLASTY (Left) ADDITIONAL DIAGNOSIS: Expected Acute Blood Loss Anemia, COPD  Plan: PT/OT WBAT, CPM 5/hrs day until ROM 0-90 degrees, then D/C CPM DVT Prophylaxis:  SCDx72hrs, ASA 325 mg BID x 2 weeks DISCHARGE PLAN: Home DISCHARGE NEEDS: HHPT, HHRN, CPM, Walker and 3-in-1 comode seat     Jojo Pehl J 10/29/2014, 8:43 AM

## 2014-10-29 NOTE — Progress Notes (Signed)
Physical Therapy Treatment Patient Details Name: Kristin Roach MRN: [id number] DOB: [birth year] Today's Date: 10/29/2014    History of Present Illness LTKA    PT Comments    Pt. Maintained O2 sats in low 90s with walking on room air; initial slight dizziness quickly resolved.  Pt. Progressing with ambulation and exercise.  Per Dr. Mayer Camel, pt. Can be DC'd today if low BP issues resolved and doing well with therapies.  Will see again later today.  Follow Up Recommendations  Home health PT     Equipment Recommendations  Rolling walker with 5" wheels;3in1 (PT)    Recommendations for Other Services       Precautions / Restrictions Precautions Precautions: Knee Precaution Booklet Issued: Yes (comment) Precaution Comments: reviewed precautions and positioning; provided written exercise handout and precautions sheet Restrictions Weight Bearing Restrictions: Yes LLE Weight Bearing: Weight bearing as tolerated    Mobility  Bed Mobility Overal bed mobility: Needs Assistance Bed Mobility: Supine to Sit     Supine to sit: Supervision     General bed mobility comments: Cues for technique; no need for physical asssist  Transfers Overall transfer level: Needs assistance Equipment used: Rolling walker (2 wheeled) Transfers: Sit to/from Stand Sit to Stand: Supervision         General transfer comment: cues for hand placement to rise to stand ; used correct technique for stand to sit; no physical assist needed  Ambulation/Gait Ambulation/Gait assistance: Supervision Ambulation Distance (Feet): 250 Feet Assistive device: Rolling walker (2 wheeled) Gait Pattern/deviations: Step-through pattern Gait velocity: slowed   General Gait Details: cues for sequence    Stairs Stairs: Yes Stairs assistance: Min guard Stair Management: One rail Left;Step to pattern;Sideways Number of Stairs: 3 General stair comments: needed vc's for correct technique but no physical assist  needed  Wheelchair Mobility    Modified Rankin (Stroke Patients Only)       Balance                                    Cognition Arousal/Alertness: Awake/alert Behavior During Therapy: WFL for tasks assessed/performed Overall Cognitive Status: Within Functional Limits for tasks assessed                      Exercises Total Joint Exercises Ankle Circles/Pumps: AROM;Both;10 reps Quad Sets: AROM;Left;10 reps Short Arc Quad: AROM;Left;10 reps Straight Leg Raises: Left;10 reps;Supine;AROM Knee Flexion: AROM;Left;5 reps;Seated Goniometric ROM: - 10 to 85    General Comments        Pertinent Vitals/Pain Pain Assessment: 0-10 Pain Score: 5  Pain Location: left knee Pain Descriptors / Indicators: Aching Pain Intervention(s): Monitored during session;Repositioned  O2 sats on 2 L O2 at rest 97%; on room air immediately following walk, O2 sats 94%; O2 reapplied after walk as pt. Had "breathing issues" earlier this am per pt.    Home Living                      Prior Function            PT Goals (current goals can now be found in the care plan section) Progress towards PT goals: Progressing toward goals    Frequency  7X/week    PT Plan Current plan remains appropriate    Co-evaluation             End of Session Equipment Utilized During Treatment:  Gait belt Activity Tolerance: Patient tolerated treatment well Patient left: in chair;with call bell/phone within reach     Time: 0812-0846 PT Time Calculation (min) (ACUTE ONLY): 34 min  Charges:  $Gait Training: 8-22 mins $Therapeutic Exercise: 8-22 mins                    G Codes:      Ladona Ridgel 10/29/2014, 8:55 AM  Gerlean Ren PT Acute Rehab Services [phone] [place] [phone]

## 2016-05-02 DIAGNOSIS — H40003 Preglaucoma, unspecified, bilateral: Secondary | ICD-10-CM | POA: Insufficient documentation

## 2016-05-02 DIAGNOSIS — H04129 Dry eye syndrome of unspecified lacrimal gland: Secondary | ICD-10-CM | POA: Insufficient documentation

## 2016-05-05 DIAGNOSIS — G8929 Other chronic pain: Secondary | ICD-10-CM | POA: Insufficient documentation

## 2016-05-05 DIAGNOSIS — M79605 Pain in left leg: Secondary | ICD-10-CM | POA: Insufficient documentation

## 2016-08-21 DIAGNOSIS — R600 Localized edema: Secondary | ICD-10-CM | POA: Insufficient documentation

## 2016-10-16 DIAGNOSIS — I890 Lymphedema, not elsewhere classified: Secondary | ICD-10-CM | POA: Insufficient documentation

## 2017-02-01 DIAGNOSIS — D3132 Benign neoplasm of left choroid: Secondary | ICD-10-CM | POA: Insufficient documentation

## 2017-02-01 DIAGNOSIS — H269 Unspecified cataract: Secondary | ICD-10-CM | POA: Insufficient documentation

## 2017-03-31 DIAGNOSIS — F419 Anxiety disorder, unspecified: Secondary | ICD-10-CM | POA: Insufficient documentation

## 2018-02-02 DIAGNOSIS — J4520 Mild intermittent asthma, uncomplicated: Secondary | ICD-10-CM | POA: Insufficient documentation

## 2018-03-06 DIAGNOSIS — H903 Sensorineural hearing loss, bilateral: Secondary | ICD-10-CM | POA: Insufficient documentation

## 2018-03-06 DIAGNOSIS — G4733 Obstructive sleep apnea (adult) (pediatric): Secondary | ICD-10-CM | POA: Insufficient documentation

## 2018-06-06 ENCOUNTER — Telehealth: Payer: Self-pay

## 2018-06-06 NOTE — Telephone Encounter (Signed)
Sent referral to scheduling and filed notes 

## 2018-07-30 ENCOUNTER — Encounter: Payer: Self-pay | Admitting: Cardiology

## 2018-07-30 ENCOUNTER — Ambulatory Visit (INDEPENDENT_AMBULATORY_CARE_PROVIDER_SITE_OTHER): Payer: Medicare Other | Admitting: Cardiology

## 2018-07-30 VITALS — BP 122/82 | HR 79 | Ht 60.0 in | Wt 167.0 lb

## 2018-07-30 DIAGNOSIS — Z7189 Other specified counseling: Secondary | ICD-10-CM | POA: Diagnosis not present

## 2018-07-30 DIAGNOSIS — R600 Localized edema: Secondary | ICD-10-CM | POA: Diagnosis not present

## 2018-07-30 DIAGNOSIS — I208 Other forms of angina pectoris: Secondary | ICD-10-CM | POA: Diagnosis not present

## 2018-07-30 MED ORDER — NITROGLYCERIN 0.4 MG SL SUBL
0.4000 mg | SUBLINGUAL_TABLET | SUBLINGUAL | 4 refills | Status: DC | PRN
Start: 2018-07-30 — End: 2019-09-09

## 2018-07-30 MED ORDER — NITROGLYCERIN 0.4 MG SL SUBL: 0.4000 mg | SUBLINGUAL_TABLET | SUBLINGUAL | 4 refills | Status: DC | PRN

## 2018-07-30 NOTE — Patient Instructions (Addendum)
Dr. Harrell Gave has prescribed nitroglycerin to use as needed for chest pain.   Your physician wants you to follow-up in: ONE YEAR with Dr. Harrell Gave. You will receive a reminder notice via [place]. Please call our office to schedule an appointment when you receive this.

## 2018-07-30 NOTE — Progress Notes (Signed)
Cardiology Office Note:    Date:  07/30/2018   ID:  Kristin Roach, DOB July 17, 1941, MRN [id number]  PCP:  System, Pcp Not In  Cardiologist:  Buford Dresser, MD PhD  Referring MD: Jettie Booze, NP   CC: Establish care  History of Present Illness:    Kristin Roach is a 77 y.o. female with a hx of OSA, asthma, arthritis, Prinzmetal's angina who is seen as a new consult at the request of Bradly Bienenstock for evaluation and management of Prinzmetal's angina.  Brings with her some records from [place]. Saw Dr. Bayard Hugger in [place], [place] (Fax [phone]). Notes states: Heart murmur, mitral valve prolapse; sleep apnea, uses CPAP. No stress test, cath, or echo available.    Chest pain: notes state 2005 (she believes it goes back to [phone]), left sided, burning, pressure like. Moves to her back. Mild. Lasts all day. Sometimes goes down her arm. Associated with shortness of breath. No associated diaphoresis or syncope, sometimes has nausea. Now it does not happen frequently, has occurred only twice in the last year. Relieved with nitroglycerin and valium. She packed and moved her entire house in [place] herself without issues. She was in the hospital about a year ago for an episode of chest pain and reported being there only overnight.  Dr. Joaquim Lai at [facility] was her internal medicine physician (linked in [place]). Thinks her last stress test was around 2012. Echo in 2017. Had prior heart caths in the early 1990s, did not require stents but does report that she had vasconstriction with ergotamine.  Active at baseline. Helps with her children and grandchildren, walks her dog routinely. Has to climb steps where she lives, able to do this without issue (about six times/day).   Risk: no diabetes. Former smoker, quit when she was 38.   FH: mother passed from MI in age 59, father died of leukemia at age 64. Two grandparents died from MI. Sister just died of lung cancer at age 60. No  other siblings. Son and daughter both have terrible migraines. Daughter was a methamphetamine user, had been sober for 8 years but has some residual issues.   Has been told both that she has chronic insufficiency and lymphedema. Has no history of open or non healing wounds. Has had wraps in the past. Uses  Compression stockings routinely. Has a history of knee and back surgery.    Past Medical History:  Diagnosis Date  . Arthritis   . Asthma    bronchial asthma  . Complication of anesthesia    woke up during back procedure  . Pneumonia    hx of 2012  . PONV (postoperative nausea and vomiting)    with knee surgery in 11 2015  . RLS (restless legs syndrome)   . Sleep apnea    cpap    Past Surgical History:  Procedure Laterality Date  . ABDOMINAL HYSTERECTOMY    . BACK SURGERY     laminectomy  . BELPHAROPTOSIS REPAIR    . CARDIAC CATHETERIZATION    . CESAREAN SECTION    . CHOLECYSTECTOMY    . HERNIA REPAIR    . SHOULDER SURGERY     removal of bone spur  . TONSILLECTOMY    . TOTAL KNEE ARTHROPLASTY Left 10/26/2014   Procedure: LEFT TOTAL KNEE ARTHROPLASTY;  Surgeon: Kerin Salen, MD;  Location: [facility];  Service: Orthopedics;  Laterality: Left;    Current Medications: Current Outpatient Medications on File Prior to Visit  Medication Sig  . aspirin EC 325 MG tablet Take 1 tablet (325 mg total) by mouth 2 (two) times daily.  . budesonide-formoterol (SYMBICORT) 80-4.5 MCG/ACT inhaler Inhale 2 puffs into the lungs as needed.   . diazepam (VALIUM) 5 MG tablet Take 5 mg by mouth every 6 (six) hours as needed for anxiety.   Marland Kitchen GABAPENTIN PO Take 1 capsule by mouth 3 (three) times daily.  . hydrochlorothiazide (HYDRODIURIL) 25 MG tablet Take by mouth.  Marland Kitchen HYDROcodone-acetaminophen (NORCO) 10-325 MG per tablet Take 1 tablet by mouth every 6 (six) hours as needed for severe pain.  . meclizine (ANTIVERT) 25 MG tablet Take by mouth.  . Melatonin 10 MG CAPS Take 10 mg by mouth at bedtime  as needed (sleep).  . potassium chloride (MICRO-K) 10 MEQ CR capsule Take by mouth.  Marland Kitchen rOPINIRole (REQUIP) 1 MG tablet Take 1 mg by mouth at bedtime.  . sertraline (ZOLOFT) 50 MG tablet Take by mouth.  . tizanidine (ZANAFLEX) 2 MG capsule Take 1 capsule (2 mg total) by mouth 3 (three) times daily. (Patient not taking: Reported on 07/30/2018)   No current facility-administered medications on file prior to visit.      Allergies:   Epinephrine; Demerol [meperidine]; Erythromycin; Flagyl [metronidazole]; and Percocet [oxycodone-acetaminophen]   Social History   Socioeconomic History  . Marital status: Single    Spouse name: Not on file  . Number of children: Not on file  . Years of education: Not on file  . Highest education level: Not on file  Occupational History  . Not on file  Social Needs  . Financial resource strain: Not on file  . Food insecurity:    Worry: Not on file    Inability: Not on file  . Transportation needs:    Medical: Not on file    Non-medical: Not on file  Tobacco Use  . Smoking status: Never Smoker  . Smokeless tobacco: Never Used  Substance and Sexual Activity  . Alcohol use: Yes    Alcohol/week: 14.0 standard drinks    Types: 14 Glasses of wine per week  . Drug use: No  . Sexual activity: Not on file  Lifestyle  . Physical activity:    Days per week: Not on file    Minutes per session: Not on file  . Stress: Not on file  Relationships  . Social connections:    Talks on phone: Not on file    Gets together: Not on file    Attends religious service: Not on file    Active member of club or organization: Not on file    Attends meetings of clubs or organizations: Not on file    Relationship status: Not on file  Other Topics Concern  . Not on file  Social History Narrative  . Not on file    ROS:   Please see the history of present illness.  Additional pertinent ROS: Review of Systems  Constitutional: Negative for chills and fever.  HENT:  Positive for hearing loss. Negative for ear pain.   Eyes: Positive for blurred vision. Negative for pain.  Respiratory: Negative for cough and shortness of breath.   Cardiovascular: Positive for leg swelling. Negative for chest pain, palpitations, orthopnea, claudication and PND.       No recent pain  Gastrointestinal: Negative for abdominal pain and blood in stool.  Genitourinary: Negative for dysuria and hematuria.  Musculoskeletal: Positive for joint pain. Negative for falls.  Skin: Negative for itching.  Neurological:  Negative for focal weakness and loss of consciousness.  Endo/Heme/Allergies: Does not bruise/bleed easily.   EKGs/Labs/Other Studies Reviewed:    The following studies were reviewed today:  Echo 09/18/16  Left Ventricle: Left ventricular ejection fraction is calculated at 58%.  Normal left ventricular size, wall thickness, systolic function with no obvious regional wall  motion abnormalities.   Left Atrium: Normal left atrial size.  Right Ventricle: Normal right ventricular size and function.  Right Atrium: Normal right atrial size.  Mitral Valve: Thickened mitral valve without stenosis.  Mild mitral regurgitation.  Aortic Valve: Trileaflet aortic valve.  Thickened aortic valve without stenosis.  No aortic insufficiency.  Tricuspid Valve: Normally structured tricuspid valve.  Mild tricuspid regurgitation.  Right ventricular systolic pressure estimated to be 44mmHg.  Pulmonic Valve: Structurally normal pulmonic valve.  Mild pulmonic regurgitation.  Aorta: Normal size aortic root and proximal ascending aorta.  Pericardium: No pericardial or pleural effusion.  Intracardiac Shunt: No intracardiac shunt.  Mass/Thrombus No intracardiac masses, thrombus or vegetations noted.  Vegetation:  Other: IVC is normal measuring 1.4cm with collapse.   CONCLUSION:  Technically difficult study.  Left ventricular ejection fraction is calculated at 58%.  Cardiac dimensions are  normal.  Mild mitral regurgitation.  Mild tricuspid regurgitation.  Mild pulmonic regurgitation.  Right ventricular systolic pressure estimated to be 33mmHg.   No significant change since the prior study of 01/23/07.  EKG:  EKG is ordered today.  The ekg ordered today demonstrates normal sinus rhythm, RSR pattern in V1 but normal QRS duration  Recent Labs: No results found for requested labs within last 8760 hours.  Recent Lipid Panel No results found for: CHOL, TRIG, HDL, CHOLHDL, VLDL, LDLCALC, LDLDIRECT  Physical Exam:    VS:  BP 122/82   Pulse 79   Ht 5' (1.524 m)   Wt 167 lb (75.8 kg)   BMI 32.61 kg/m     Wt Readings from Last 3 Encounters:  07/30/18 167 lb (75.8 kg)  10/26/14 176 lb (79.8 kg)  10/16/14 176 lb 5.9 oz (80 kg)     GEN: Well nourished, well developed in no acute distress HEENT: Normal NECK: No JVD; No carotid bruits LYMPHATICS: No lymphadenopathy CARDIAC: regular rhythm, normal S1 and S2, no murmurs, rubs, gallops. Radial and DP pulses 2+ bilaterally. RESPIRATORY:  Clear to auscultation without rales, wheezing or rhonchi  ABDOMEN: Soft, non-tender, non-distended MUSCULOSKELETAL:  Non pitting edema bilaterally, trace; No deformity  SKIN: Warm and dry NEUROLOGIC:  Alert and oriented x 3 PSYCHIATRIC:  Normal affect   ASSESSMENT:    1. Other forms of angina pectoris ([place])   2. Counseling on health promotion and disease prevention   3. Bilateral lower extremity edema    PLAN:    1. Prinzmetal's angina: chronic diagnosis, dates to 9s. Has had caths, stress tests, and echocardiograms; only most recent echo accessible in the system. Has only had two episodes in the last year, which she treated with valium and nitroglycerin. I refilled her nitroglycerin, but I do not prescribe benzodiazepines. Instructed her on red flag symptoms and reasons to seek immediate medical attention. Will try to get records. If her symptoms become more frequent or bothersome, may  need to reassess.  2. Cardiovascular risk: active in her daily life. No diabetes. No HTN, no tobacco. Counseled on lifestyle. She is not taking the listed aspirin, this was discontinued. 3. Lymphedema vs. Chronic insufficiency: managing with compression stockings now. Good pulses. May need to find specialist if they become problematic  in the future. She is prescribed HCTZ for swelling but does not take. Instructed that if she does take routinely, she will need her potassium monitored.  Plan for follow up: 1 year or sooner PRN.  Medication Adjustments/Labs and Tests Ordered: Current medicines are reviewed at length with the patient today.  Concerns regarding medicines are outlined above.  Orders Placed This Encounter  Procedures  . EKG 12-Lead   Meds ordered this encounter  Medications  . DISCONTD: nitroGLYCERIN (NITROSTAT) 0.4 MG SL tablet    Sig: Place 1 tablet (0.4 mg total) under the tongue every 5 (five) minutes as needed for chest pain.    Dispense:  25 tablet    Refill:  4  . nitroGLYCERIN (NITROSTAT) 0.4 MG SL tablet    Sig: Place 1 tablet (0.4 mg total) under the tongue every 5 (five) minutes as needed for chest pain.    Dispense:  25 tablet    Refill:  4    Patient Instructions  Dr. Harrell Gave has prescribed nitroglycerin to use as needed for chest pain.   Your physician wants you to follow-up in: ONE YEAR with Dr. Harrell Gave. You will receive a reminder notice via [place]. Please call our office to schedule an appointment when you receive this.      Signed, Buford Dresser, MD PhD 07/30/2018 4:04 PM    [place]

## 2018-08-05 ENCOUNTER — Other Ambulatory Visit (HOSPITAL_BASED_OUTPATIENT_CLINIC_OR_DEPARTMENT_OTHER): Payer: Self-pay

## 2018-08-05 DIAGNOSIS — G4733 Obstructive sleep apnea (adult) (pediatric): Secondary | ICD-10-CM

## 2018-08-28 ENCOUNTER — Ambulatory Visit (HOSPITAL_BASED_OUTPATIENT_CLINIC_OR_DEPARTMENT_OTHER): Payer: Medicare Other | Admitting: Internal Medicine

## 2018-09-09 ENCOUNTER — Ambulatory Visit (HOSPITAL_BASED_OUTPATIENT_CLINIC_OR_DEPARTMENT_OTHER): Payer: Medicare Other | Attending: Otolaryngology | Admitting: Internal Medicine

## 2018-09-09 VITALS — Ht 60.0 in | Wt 162.0 lb

## 2018-09-09 DIAGNOSIS — G4733 Obstructive sleep apnea (adult) (pediatric): Secondary | ICD-10-CM | POA: Diagnosis present

## 2018-09-21 DIAGNOSIS — G4733 Obstructive sleep apnea (adult) (pediatric): Secondary | ICD-10-CM | POA: Diagnosis not present

## 2018-09-21 NOTE — Procedures (Signed)
   Patient Name: Kristin Roach, Kristin Roach Date: 09/10/2018 Gender: Female D.O.B: 1941/07/28 Age (years): 77 Referring Provider: Melida Quitter Height (inches): 35 Interpreting Physician: Baird Lyons MD, ABSM Weight (lbs): 162 RPSGT: Jacolyn Reedy BMI: 32 MRN: [id number] Neck Size: 12.00  CLINICAL INFORMATION Sleep Study Type: HST Indication for sleep study: OSA Epworth Sleepiness Score: 6  Most recent polysomnogram was on 08/28/2018.  SLEEP STUDY TECHNIQUE A multi-channel overnight portable sleep study was performed. The channels recorded were: nasal airflow, thoracic respiratory movement, and oxygen saturation with a pulse oximetry. Snoring was also monitored.  MEDICATIONS Patient self administered medications include: none reported.  SLEEP ARCHITECTURE Patient was studied for 367.2 minutes. The sleep efficiency was 93.6 % and the patient was supine for 21.2%. The arousal index was 0.0 per hour.  RESPIRATORY PARAMETERS The overall AHI was 14.7 per hour, with a central apnea index of 0.0 per hour. The oxygen nadir was 81% during sleep.  CARDIAC DATA Mean heart rate during sleep was 60.8 bpm.  IMPRESSIONS - Mild obstructive sleep apnea occurred during this study (AHI = 14.7/h). - No significant central sleep apnea occurred during this study (CAI = 0.0/h). - Moderate oxygen desaturation was noted during this study (Min O2 = 81%). - Patient snored.  DIAGNOSIS - Obstructive Sleep Apnea (327.23 [G47.33 ICD-10])  RECOMMENDATIONS - Suggest CPAP titration sleep study or DME autopap. Other options, including a fitted oral appliance or ENT evaluation, would be based on clinical judgment. - Be careful with alcohol, sedatives and other CNS depressants that may worsen sleep apnea and disrupt normal sleep architecture. - Sleep hygiene should be reviewed to assess factors that may improve sleep quality. - Weight management and regular exercise should be initiated or  continued.  [Electronically signed] 09/21/2018 11:01 AM  Baird Lyons MD, ABSM Diplomate, American Board of Sleep Medicine   NPI: [id number]                          [place] [place] [facility]  ELECTRONICALLY SIGNED ON:  09/21/2018, 10:58 AM [place] PH: [phone]   FX: [phone] [place]

## 2018-09-23 ENCOUNTER — Emergency Department (HOSPITAL_COMMUNITY): Payer: Medicare Other

## 2018-09-23 ENCOUNTER — Other Ambulatory Visit: Payer: Self-pay

## 2018-09-23 ENCOUNTER — Encounter (HOSPITAL_COMMUNITY): Payer: Self-pay | Admitting: Emergency Medicine

## 2018-09-23 ENCOUNTER — Emergency Department (HOSPITAL_COMMUNITY)
Admission: EM | Admit: 2018-09-23 | Discharge: 2018-09-23 | Disposition: A | Discharge status: Home / Self Care | Payer: Medicare Other | Attending: Emergency Medicine | Admitting: Emergency Medicine

## 2018-09-23 DIAGNOSIS — S51851A Open bite of right forearm, initial encounter: Secondary | ICD-10-CM | POA: Diagnosis not present

## 2018-09-23 DIAGNOSIS — W0110XA Fall on same level from slipping, tripping and stumbling with subsequent striking against unspecified object, initial encounter: Secondary | ICD-10-CM | POA: Diagnosis not present

## 2018-09-23 DIAGNOSIS — W19XXXA Unspecified fall, initial encounter: Secondary | ICD-10-CM

## 2018-09-23 DIAGNOSIS — Y999 Unspecified external cause status: Secondary | ICD-10-CM | POA: Diagnosis not present

## 2018-09-23 DIAGNOSIS — J45909 Unspecified asthma, uncomplicated: Secondary | ICD-10-CM | POA: Diagnosis not present

## 2018-09-23 DIAGNOSIS — Z96652 Presence of left artificial knee joint: Secondary | ICD-10-CM | POA: Diagnosis not present

## 2018-09-23 DIAGNOSIS — Y929 Unspecified place or not applicable: Secondary | ICD-10-CM | POA: Diagnosis not present

## 2018-09-23 DIAGNOSIS — Y939 Activity, unspecified: Secondary | ICD-10-CM | POA: Diagnosis not present

## 2018-09-23 DIAGNOSIS — Z79899 Other long term (current) drug therapy: Secondary | ICD-10-CM | POA: Insufficient documentation

## 2018-09-23 DIAGNOSIS — S0990XA Unspecified injury of head, initial encounter: Secondary | ICD-10-CM | POA: Insufficient documentation

## 2018-09-23 DIAGNOSIS — I209 Angina pectoris, unspecified: Secondary | ICD-10-CM | POA: Insufficient documentation

## 2018-09-23 DIAGNOSIS — Z7982 Long term (current) use of aspirin: Secondary | ICD-10-CM | POA: Insufficient documentation

## 2018-09-23 DIAGNOSIS — R079 Chest pain, unspecified: Secondary | ICD-10-CM | POA: Diagnosis present

## 2018-09-23 DIAGNOSIS — W5501XA Bitten by cat, initial encounter: Secondary | ICD-10-CM

## 2018-09-23 LAB — BASIC METABOLIC PANEL
ANION GAP: 9 (ref 5–15)
BUN: 15 mg/dL (ref 8–23)
CO2: 29 mmol/L (ref 22–32)
Calcium: 9.4 mg/dL (ref 8.9–10.3)
Chloride: 105 mmol/L (ref 98–111)
Creatinine, Ser: 0.67 mg/dL (ref 0.44–1.00)
GFR calc Af Amer: >60 mL/min (ref >60)
GFR calc non Af Amer: >60 mL/min (ref >60)
Glucose, Bld: 94 mg/dL (ref 70–99)
POTASSIUM: 4.1 mmol/L (ref 3.5–5.1)
Sodium: 143 mmol/L (ref 135–145)

## 2018-09-23 LAB — CBC
HEMATOCRIT: 40.5 % (ref 36.0–46.0)
HEMOGLOBIN: 12.6 g/dL (ref 12.0–15.0)
MCH: 28.5 pg (ref 26.0–34.0)
MCHC: 31.1 g/dL (ref 30.0–36.0)
MCV: 91.6 fL (ref 78.0–100.0)
Platelets: 296 10*3/uL (ref 150–400)
RBC: 4.42 MIL/uL (ref 3.87–5.11)
RDW: 14 % (ref 11.5–15.5)
WBC: 10.4 10*3/uL (ref 4.0–10.5)

## 2018-09-23 LAB — POCT I-STAT TROPONIN I: Troponin i, poc: 0 ng/mL (ref 0.00–0.08)

## 2018-09-23 MED ORDER — AMOXICILLIN-POT CLAVULANATE 875-125 MG PO TABS
1.0000 | ORAL_TABLET | Freq: Once | ORAL | Status: AC
  Administered 2018-09-23: 1 via ORAL
  Filled 2018-09-23: qty 1

## 2018-09-23 MED ORDER — NITROGLYCERIN 2 % TD OINT
1.0000 [in_us] | TOPICAL_OINTMENT | Freq: Four times a day (QID) | TRANSDERMAL | Status: DC
  Administered 2018-09-23: 1 [in_us] via TOPICAL
  Filled 2018-09-23: qty 1

## 2018-09-23 NOTE — ED Triage Notes (Signed)
Pt presents with multiple complaints; complaint of left chest pain radiating to back onset 2-3 days ago with hx of angina; cat bite from stray cat was placed on topical antibotoic for such; site to right forearm; pt continues to verbalizes hit head on refrigorator on Friday; alert and oriented x4.

## 2018-09-23 NOTE — ED Notes (Addendum)
Pt d/c home per MD order. Pt verbalizes understanding of discharge summary. Pt ambulatory out of ED, voicing no complaints. Pt did not sign e-signature.

## 2018-09-23 NOTE — Discharge Instructions (Addendum)
All the results in the ER are normal, labs and imaging. We recommend that you take the antibiotics prescribed for the cat bite.  Additionally, see your doctor for chest pain if they continue despite efforts to reduce your stress.  Finally, if the headaches persist she will have to see your primary care doctor as well.  CT scan of the head does not show any emergent concerns.

## 2018-09-23 NOTE — ED Provider Notes (Signed)
[facility] DEPT Provider Note   CSN: [id number] Arrival date & time: 09/23/18  1251     History   Chief Complaint Chief Complaint  Patient presents with  . Chest Pain  . Animal Bite  . Head Injury    HPI Kristin Roach is a 77 y.o. female.  HPI 77 year old female comes in with chief complaint of chest pain, animal bite and head injury. Patient reports that she was bit by a stray cat few days ago.  She saw her PCP and was started on palpable medicine.  She has noted that the redness has expanded a little bit, and that the symptoms have not completely resolved.  She denies any associated fevers, chills, nausea, vomiting.  Patient also reports that she struck her head with a fall few days ago. she has had persistent headache, and is not responding to oral narcotic medicine at home.  Patient's PCP ordered CAT scan, but when she called again with the complaints for pain she was advised to come to the ER for evaluation.  Patient denies any associated nausea, vision changes, dizziness, new numbness or weakness.  Finally, patient reports that she has been going through a lot of stress at home and she is having chest pain.  Chest pain is left-sided, described as heaviness and similar to her Prinzmetal angina pain.  She has no known history of CAD and has been taking nitroglycerin along with diazepam at home.  Patient's pain is not exertional.  It is worse when she lays flat and she denies any cough or pleuritic component to the pain.  Past Medical History:  Diagnosis Date  . Arthritis   . Asthma    bronchial asthma  . Complication of anesthesia    woke up during back procedure  . Pneumonia    hx of 2012  . PONV (postoperative nausea and vomiting)    with knee surgery in 11 2015  . RLS (restless legs syndrome)   . Sleep apnea    cpap    Patient Active Problem List   Diagnosis Date Noted  . Arthritis of left knee Primary 10/26/2014    Past Surgical  History:  Procedure Laterality Date  . ABDOMINAL HYSTERECTOMY    . BACK SURGERY     laminectomy  . BELPHAROPTOSIS REPAIR    . CARDIAC CATHETERIZATION    . CESAREAN SECTION    . CHOLECYSTECTOMY    . HERNIA REPAIR    . SHOULDER SURGERY     removal of bone spur  . TONSILLECTOMY    . TOTAL KNEE ARTHROPLASTY Left 10/26/2014   Procedure: LEFT TOTAL KNEE ARTHROPLASTY;  Surgeon: Kerin Salen, MD;  Location: [facility];  Service: Orthopedics;  Laterality: Left;     OB History   None      Home Medications    Prior to Admission medications   Medication Sig Start Date End Date Taking? Authorizing Provider  aspirin 81 MG tablet Take 243 mg by mouth daily.   Yes [provider]  budesonide-formoterol (SYMBICORT) 80-4.5 MCG/ACT inhaler Inhale 2 puffs into the lungs as needed.    Yes [provider]  diazepam (VALIUM) 5 MG tablet Take 5 mg by mouth every 6 (six) hours as needed for anxiety.  06/13/17  Yes [provider]  HYDROcodone-acetaminophen (NORCO) 7.5-325 MG tablet Take 1 tablet by mouth 4 (four) times daily as needed for moderate pain.   Yes [provider]  mupirocin ointment (BACTROBAN) 2 %  Apply 1 application topically 3 (three) times daily. 09/20/18  Yes [provider]  nitroGLYCERIN (NITROSTAT) 0.4 MG SL tablet Place 1 tablet (0.4 mg total) under the tongue every 5 (five) minutes as needed for chest pain. 07/30/18  Yes Buford Dresser, MD  rOPINIRole (REQUIP) 1 MG tablet Take 1 mg by mouth at bedtime.   Yes [provider]  sertraline (ZOLOFT) 50 MG tablet Take 50 mg by mouth daily.  12/04/17  Yes [provider]  HYDROcodone-acetaminophen (NORCO) 10-325 MG per tablet Take 1 tablet by mouth every 6 (six) hours as needed for severe pain. Patient not taking: Reported on 09/23/2018 10/26/14   Leighton Parody, PA-C  tizanidine (ZANAFLEX) 2 MG capsule Take 1 capsule (2 mg total) by mouth 3 (three) times daily. Patient not  taking: Reported on 07/30/2018 10/26/14   Leighton Parody, PA-C    Family History No family history on file.  Social History Social History   Tobacco Use  . Smoking status: Never Smoker  . Smokeless tobacco: Never Used  Substance Use Topics  . Alcohol use: Yes    Alcohol/week: 14.0 standard drinks    Types: 14 Glasses of wine per week  . Drug use: No     Allergies   Epinephrine; Ergonovine; Demerol [meperidine]; Erythromycin; Flagyl [metronidazole]; and Percocet [oxycodone-acetaminophen]   Review of Systems Review of Systems  Constitutional: Negative for diaphoresis.  Respiratory: Negative for cough and shortness of breath.   Cardiovascular: Positive for chest pain.  Gastrointestinal: Negative for abdominal pain, nausea and vomiting.  Skin: Positive for rash.  Allergic/Immunologic: Negative for immunocompromised state.  Neurological: Positive for headaches. Negative for dizziness, seizures, syncope, facial asymmetry and light-headedness.  All other systems reviewed and are negative.    Physical Exam Updated Vital Signs BP 140/86   Pulse 70   Temp 98 F (36.7 C) (Oral)   Resp 15   SpO2 100%   Physical Exam  Constitutional: She is oriented to person, place, and time. She appears well-developed.  HENT:  Head: Normocephalic and atraumatic.  Eyes: EOM are normal.  Neck: Normal range of motion. Neck supple.  Cardiovascular: Normal rate.  Pulmonary/Chest: Effort normal.  Abdominal: Bowel sounds are normal.  Musculoskeletal:       Right lower leg: She exhibits no tenderness and no edema.       Left lower leg: She exhibits no tenderness and no edema.  Neurological: She is alert and oriented to person, place, and time.  Skin: Skin is warm and dry. Rash noted.  Right distal forearm has a 2 cm diameter scab with surrounding erythema. No streaking.  Nursing note and vitals reviewed.    ED Treatments / Results  Labs (all labs ordered are listed, but only abnormal  results are displayed) Labs Reviewed  BASIC METABOLIC PANEL  CBC  I-STAT TROPONIN, ED  POCT I-STAT TROPONIN I    EKG EKG Interpretation  Date/Time:  Monday September 23 2018 12:56:37 EDT Ventricular Rate:  85 PR Interval:    QRS Duration: 122 QT Interval:  374 QTC Calculation: 445 R Axis:   -56 Text Interpretation:  Sinus rhythm Left bundle branch block No acute changes No old tracing to compare Poor R wave progression No old tracing to compare Reconfirmed by Varney Biles [phone]) on 09/24/2018 10:18:19 AM   Radiology Dg Chest 2 View  Result Date: 09/23/2018 CLINICAL DATA:  Left chest pain radiating to the back for the last 2-3 days. Shortness of breath. Ex-smoker. Asthma. EXAM:  CHEST - 2 VIEW COMPARISON:  None. FINDINGS: Normal sized heart. Tortuous aorta. Clear lungs. Minimal thoracic spine degenerative changes and mild scoliosis. Cholecystectomy clips. IMPRESSION: No acute abnormality. Electronically Signed   By: Claudie Revering M.D.   On: 09/23/2018 13:26   Ct Head Wo Contrast  Result Date: 09/23/2018 CLINICAL DATA:  Headache status post trauma EXAM: CT HEAD WITHOUT CONTRAST TECHNIQUE: Contiguous axial images were obtained from the base of the skull through the vertex without intravenous contrast. COMPARISON:  None. FINDINGS: Brain: No evidence of acute infarction, hemorrhage, hydrocephalus, extra-axial collection or mass lesion/mass effect. Vascular: No hyperdense vessel or unexpected calcification. Skull: No osseous abnormality. Sinuses/Orbits: Visualized paranasal sinuses are clear. Visualized mastoid sinuses are clear. Visualized orbits demonstrate no focal abnormality. Other: None IMPRESSION: No acute intracranial pathology. Electronically Signed   By: Kathreen Devoid   On: 09/23/2018 15:37    Procedures Procedures (including critical care time)  Medications Ordered in ED Medications  amoxicillin-clavulanate (AUGMENTIN) 875-125 MG per tablet 1 tablet (1 tablet Oral Given 09/23/18  1752)     Initial Impression / Assessment and Plan / ED Course  I have reviewed the triage vital signs and the nursing notes.  Pertinent labs & imaging results that were available during my care of the patient were reviewed by me and considered in my medical decision making (see chart for details).  Clinical Course as of Sep 24 1032  Tue Sep 24, 2018  1032 I called Ms. Mentzel again because I had forgotten to give her the prescription for Augmentin.  Upon reassessment, she reports that her chest pain has persisted.  We will add Naprosyn to her prescription along with Augmentin.  She has been advised to return to the ER if her chest pain gets worse or she starts developing new symptoms.  She is also been asked to see her PCP if the pain has no change, but does not resolve.  Patient is in agreement with the plan.    [AN]    Clinical Course User Index [AN] Varney Biles, MD    77 year old female comes in with chief complaint of chest pain, headache and animal bite. Her animal bite occurred several days ago.  She will be started on Augmentin because of the wound that is not completely healing.  No systemic symptoms or signs associated with the bite requiring IV antibiotics and admission.  We will start her on Augmentin.  Additionally, patient is having chest pain.  She has known history of chronic angina and her pain is similar.  She has no known CAD history.  Her EKG shows left bundle branch block, we do not have an older EKG to compare patient's EKG with.  Her chest pain is not exertional, it is worse when she is laying flat.  There is no pleuritic component and no PE risk factors which is reassuring.  Troponin has been ordered for this persistent pain.  If it is negative then we will not undergo any further work-up.  Nitropaste has been provided.  Additionally, patient had head injury with worsening headache.  CT had ordered and is negative for acute process.  She has no focal neuro  deficits.    Final Clinical Impressions(s) / ED Diagnoses   Final diagnoses:  Cat bite, initial encounter  Angina pectoris [facility])  Fall, initial encounter    ED Discharge Orders    None       Varney Biles, MD 09/24/18 [phone]

## 2018-10-08 ENCOUNTER — Other Ambulatory Visit: Payer: Self-pay | Admitting: Orthopedic Surgery

## 2018-10-08 DIAGNOSIS — M25562 Pain in left knee: Secondary | ICD-10-CM

## 2018-10-10 ENCOUNTER — Other Ambulatory Visit (HOSPITAL_COMMUNITY): Payer: Self-pay | Admitting: Orthopedic Surgery

## 2018-10-10 DIAGNOSIS — M25562 Pain in left knee: Secondary | ICD-10-CM

## 2018-10-22 ENCOUNTER — Encounter (HOSPITAL_COMMUNITY)
Admission: RE | Admit: 2018-10-22 | Discharge: 2018-10-22 | Disposition: A | Discharge status: Home / Self Care | Payer: Medicare Other | Source: Ambulatory Visit | Attending: Orthopedic Surgery | Admitting: Orthopedic Surgery

## 2018-10-22 DIAGNOSIS — M25562 Pain in left knee: Secondary | ICD-10-CM | POA: Diagnosis not present

## 2018-10-22 MED ORDER — TECHNETIUM TC 99M MEDRONATE IV KIT
20.0000 | PACK | Freq: Once | INTRAVENOUS | Status: AC | PRN
  Administered 2018-10-22: 20 via INTRAVENOUS

## 2018-10-29 ENCOUNTER — Other Ambulatory Visit: Payer: Self-pay | Admitting: Physical Medicine and Rehabilitation

## 2018-10-29 DIAGNOSIS — M545 Low back pain, unspecified: Secondary | ICD-10-CM

## 2018-11-01 ENCOUNTER — Ambulatory Visit: Payer: Medicare Other

## 2018-11-11 ENCOUNTER — Ambulatory Visit
Admission: RE | Admit: 2018-11-11 | Discharge: 2018-11-11 | Disposition: A | Discharge status: Home / Self Care | Payer: Medicare Other | Source: Ambulatory Visit | Attending: Physical Medicine and Rehabilitation | Admitting: Physical Medicine and Rehabilitation

## 2018-11-11 DIAGNOSIS — M545 Low back pain, unspecified: Secondary | ICD-10-CM

## 2018-11-11 MED ORDER — GADOBENATE DIMEGLUMINE 529 MG/ML IV SOLN
15.0000 mL | Freq: Once | INTRAVENOUS | Status: AC | PRN
  Administered 2018-11-11: 15 mL via INTRAVENOUS

## 2018-12-14 ENCOUNTER — Emergency Department (HOSPITAL_COMMUNITY)
Admission: EM | Admit: 2018-12-14 | Discharge: 2018-12-14 | Disposition: A | Discharge status: Home / Self Care | Payer: Medicare Other | Attending: Emergency Medicine | Admitting: Emergency Medicine

## 2018-12-14 ENCOUNTER — Encounter (HOSPITAL_COMMUNITY): Payer: Self-pay

## 2018-12-14 ENCOUNTER — Emergency Department (HOSPITAL_COMMUNITY): Payer: Medicare Other

## 2018-12-14 ENCOUNTER — Other Ambulatory Visit: Payer: Self-pay

## 2018-12-14 DIAGNOSIS — J45909 Unspecified asthma, uncomplicated: Secondary | ICD-10-CM | POA: Diagnosis not present

## 2018-12-14 DIAGNOSIS — M25471 Effusion, right ankle: Secondary | ICD-10-CM | POA: Diagnosis not present

## 2018-12-14 DIAGNOSIS — M25571 Pain in right ankle and joints of right foot: Secondary | ICD-10-CM | POA: Insufficient documentation

## 2018-12-14 DIAGNOSIS — Z79899 Other long term (current) drug therapy: Secondary | ICD-10-CM | POA: Diagnosis not present

## 2018-12-14 DIAGNOSIS — Z7982 Long term (current) use of aspirin: Secondary | ICD-10-CM | POA: Diagnosis not present

## 2018-12-14 HISTORY — DX: Other forms of angina pectoris: I20.8

## 2018-12-14 HISTORY — DX: Other forms of angina pectoris: I20.89

## 2018-12-14 HISTORY — DX: Unspecified cataract: H26.9

## 2018-12-14 HISTORY — DX: Unspecified hearing loss, unspecified ear: H91.90

## 2018-12-14 NOTE — Discharge Instructions (Addendum)
Start wearing boot on R foot while walking. Keep foot elevated as often as possible. Follow up tomorrow morning for ultrasound.  Return to ER immediately if any chest pain or shortness of breath.

## 2018-12-14 NOTE — ED Provider Notes (Signed)
[facility] DEPT Provider Note   CSN: [id number] Arrival date & time: 12/14/18  1459     History   Chief Complaint Chief Complaint  Patient presents with  . Joint Swelling  . Ankle Pain    HPI Kristin Roach is a 77 y.o. female.  77 year old female with past medical history below including asthma, OSA, arthritis who presents with right ankle swelling and pain.  Patient states that she was having right foot pain and swelling approximately 1 month ago and was seen at [place] by Dr. Lowella Petties.  She had foot x-rays that time and was told that she had a fallen foot arch.  She was placed in a boot.  She has continued to have pain that has moved up into her right medial ankle with associated swelling.  She denies any trauma.  Pain has gotten worse and she mentioned problems to PCP yesterday.  PCP ordered an ultrasound study which has not yet been completed.  She states that her pain became worse today and she decided to come to the ER for evaluation.  Her pain radiates up her lower leg but is primarily in medial ankle. Pain is worse w/ ambulation. No fevers. She reports recent short trip to mountains but no long travel. She reports chronic lymphedema of legs for which she wears compression two hours per day.   The history is provided by the patient.  Ankle Pain   Pertinent negatives include no numbness.    Past Medical History:  Diagnosis Date  . Angina at rest [facility]   . Arthritis   . Asthma    bronchial asthma  . Cataracts, bilateral   . Complication of anesthesia    woke up during back procedure  . HOH (hard of hearing)   . Pneumonia    hx of 2012  . PONV (postoperative nausea and vomiting)    with knee surgery in 11 2015  . RLS (restless legs syndrome)   . Sleep apnea    cpap    Patient Active Problem List   Diagnosis Date Noted  . Arthritis of left knee Primary 10/26/2014    Past Surgical History:  Procedure Laterality Date  .  ABDOMINAL HYSTERECTOMY    . BACK SURGERY     laminectomy  . BELPHAROPTOSIS REPAIR    . CARDIAC CATHETERIZATION    . CESAREAN SECTION    . CHOLECYSTECTOMY    . HERNIA REPAIR    . SHOULDER SURGERY     removal of bone spur  . TONSILLECTOMY    . TOTAL KNEE ARTHROPLASTY Left 10/26/2014   Procedure: LEFT TOTAL KNEE ARTHROPLASTY;  Surgeon: Kerin Salen, MD;  Location: [facility];  Service: Orthopedics;  Laterality: Left;     OB History   No obstetric history on file.      Home Medications    Prior to Admission medications   Medication Sig Start Date End Date Taking? Authorizing Provider  aspirin 81 MG tablet Take 243 mg by mouth daily.    [provider]  budesonide-formoterol (SYMBICORT) 80-4.5 MCG/ACT inhaler Inhale 2 puffs into the lungs as needed.     [provider]  diazepam (VALIUM) 5 MG tablet Take 5 mg by mouth every 6 (six) hours as needed for anxiety.  06/13/17   [provider]  HYDROcodone-acetaminophen (NORCO) 10-325 MG per tablet Take 1 tablet by mouth every 6 (six) hours as needed for severe pain. Patient not taking: Reported on 09/23/2018 10/26/14  Leighton Parody, PA-C  HYDROcodone-acetaminophen (NORCO) 7.5-325 MG tablet Take 1 tablet by mouth 4 (four) times daily as needed for moderate pain.    [provider]  mupirocin ointment (BACTROBAN) 2 % Apply 1 application topically 3 (three) times daily. 09/20/18   [provider]  nitroGLYCERIN (NITROSTAT) 0.4 MG SL tablet Place 1 tablet (0.4 mg total) under the tongue every 5 (five) minutes as needed for chest pain. 07/30/18   Buford Dresser, MD  rOPINIRole (REQUIP) 1 MG tablet Take 1 mg by mouth at bedtime.    [provider]  sertraline (ZOLOFT) 50 MG tablet Take 50 mg by mouth daily.  12/04/17   [provider]  tizanidine (ZANAFLEX) 2 MG capsule Take 1 capsule (2 mg total) by mouth 3 (three) times daily. Patient not taking: Reported on 07/30/2018 10/26/14    Leighton Parody, PA-C    Family History History reviewed. No pertinent family history.  Social History Social History   Tobacco Use  . Smoking status: Never Smoker  . Smokeless tobacco: Never Used  Substance Use Topics  . Alcohol use: Not Currently    Alcohol/week: 14.0 standard drinks    Types: 14 Glasses of wine per week  . Drug use: No     Allergies   Epinephrine; Ergonovine; Demerol [meperidine]; Erythromycin; Flagyl [metronidazole]; and Percocet [oxycodone-acetaminophen]   Review of Systems Review of Systems  Constitutional: Negative for fever.  Musculoskeletal: Positive for gait problem and joint swelling.  Skin: Negative for rash and wound.  Neurological: Negative for numbness.     Physical Exam Updated Vital Signs BP (!) 158/74 (BP Location: Left Arm)   Pulse 64   Temp 98.7 F (37.1 C) (Oral)   Resp 18   Ht 4\' 10"  (1.473 m)   Wt 76.7 kg   SpO2 95%   BMI 35.32 kg/m   Physical Exam Vitals signs and nursing note reviewed.  Constitutional:      General: She is not in acute distress.    Appearance: She is well-developed.  HENT:     Head: Normocephalic and atraumatic.  Cardiovascular:     Pulses: Normal pulses.  Musculoskeletal:        General: No deformity.     Comments: Tenderness of central R medial malleolus, mild medial ankle swelling with no warmth or redness; no calf tenderness or edema of R lower leg compared to left; b/l pes planus  Skin:    General: Skin is warm and dry.     Findings: No erythema.  Neurological:     Mental Status: She is alert and oriented to person, place, and time.     Comments: Normal sensation b/l lower extremities  Psychiatric:        Judgment: Judgment normal.      ED Treatments / Results  Labs (all labs ordered are listed, but only abnormal results are displayed) Labs Reviewed - No data to display  EKG None  Radiology Dg Ankle Complete Right  Result Date: 12/14/2018 CLINICAL DATA:  Chronic right  ankle pain and swelling. EXAM: RIGHT ANKLE - COMPLETE 3+ VIEW COMPARISON:  None. FINDINGS: There is no evidence of fracture or dislocation. The ankle mortise is intact; the interosseous space is within normal limits. No talar tilt or subluxation is seen. The joint spaces are preserved. Diffuse soft tissue swelling is noted about the ankle. IMPRESSION: No evidence of fracture or dislocation. Electronically Signed   By: Garald Balding M.D.   On: 12/14/2018 22:16  Procedures Procedures (including critical care time)  Medications Ordered in ED Medications - No data to display   Initial Impression / Assessment and Plan / ED Course  I have reviewed the triage vital signs and the nursing notes.  Pertinent labs & imaging results that were available during my care of the patient were reviewed by me and considered in my medical decision making (see chart for details).     Based on physical exam, I suspect ankle pain may be related to pes planus. She is point tender on medial malleolus which reproduces her pain. I do not appreciate any actual leg swelling. Based on this, I feel musculoskeletal problem is much more likely and feel DVT is very unlikely. However, for r/o of DVT will obtain US. Based on time of day, she will return to [facility] tomorrow morning for the study. I discussed risks and benefits of anticoagulation. Ultimately, patient declined and preferred waiting for [place] results tomorrow. Extensively reviewed return precautions including CP/SOB and she voiced understanding.   Final Clinical Impressions(s) / ED Diagnoses   Final diagnoses:  Pain and swelling of right ankle    ED Discharge Orders         Ordered    [facility]     12/14/18 2226           Little, Wenda Overland, MD 12/14/18 2248

## 2018-12-14 NOTE — ED Triage Notes (Signed)
Patient c/o right ankle and right ankle swelling x 1 month,but states the pain has gotten worse. Patient states she was seen at [place] a month ago and was told she had a fallen arch.  Patient was told to come to the ED to rule out a DVT.

## 2018-12-15 ENCOUNTER — Ambulatory Visit (HOSPITAL_COMMUNITY): Admission: RE | Admit: 2018-12-15 | Payer: Medicare Other | Source: Ambulatory Visit

## 2018-12-16 ENCOUNTER — Ambulatory Visit (HOSPITAL_COMMUNITY)
Admission: RE | Admit: 2018-12-16 | Discharge: 2018-12-16 | Disposition: A | Discharge status: Home / Self Care | Payer: Medicare Other | Source: Ambulatory Visit | Attending: Emergency Medicine | Admitting: Emergency Medicine

## 2018-12-16 DIAGNOSIS — M25571 Pain in right ankle and joints of right foot: Secondary | ICD-10-CM | POA: Diagnosis not present

## 2018-12-16 DIAGNOSIS — M25471 Effusion, right ankle: Secondary | ICD-10-CM | POA: Diagnosis not present

## 2018-12-16 NOTE — Progress Notes (Signed)
Right lower extremity venous duplex has been completed. Negative for DVT.  12/16/18 9:50 AM Kristin Roach RVT

## 2018-12-25 DIAGNOSIS — G2581 Restless legs syndrome: Secondary | ICD-10-CM | POA: Insufficient documentation

## 2019-04-16 ENCOUNTER — Other Ambulatory Visit: Payer: Self-pay | Admitting: Orthopaedic Surgery

## 2019-04-16 DIAGNOSIS — M25571 Pain in right ankle and joints of right foot: Secondary | ICD-10-CM

## 2019-04-29 ENCOUNTER — Other Ambulatory Visit: Payer: Self-pay

## 2019-04-29 ENCOUNTER — Ambulatory Visit
Admission: RE | Admit: 2019-04-29 | Discharge: 2019-04-29 | Disposition: A | Discharge status: Home / Self Care | Payer: Medicare Other | Source: Ambulatory Visit | Attending: Orthopaedic Surgery | Admitting: Orthopaedic Surgery

## 2019-04-29 DIAGNOSIS — M25571 Pain in right ankle and joints of right foot: Secondary | ICD-10-CM

## 2019-05-06 DIAGNOSIS — H8112 Benign paroxysmal vertigo, left ear: Secondary | ICD-10-CM | POA: Insufficient documentation

## 2019-07-16 ENCOUNTER — Telehealth: Payer: Self-pay | Admitting: Cardiology

## 2019-07-16 NOTE — Telephone Encounter (Signed)
OK by me 

## 2019-07-16 NOTE — Telephone Encounter (Signed)
New message    Patient would like to be released from seeing Dr. Harrell Gave and now see Dr. Irish Lack.  Please advise.

## 2019-07-23 NOTE — Telephone Encounter (Signed)
Follow up   Per the previous message is it ok for the patient to change physicians? Please advise.

## 2019-07-23 NOTE — Telephone Encounter (Signed)
ok 

## 2019-09-07 ENCOUNTER — Emergency Department (HOSPITAL_COMMUNITY): Payer: Medicare Other

## 2019-09-07 ENCOUNTER — Encounter (HOSPITAL_COMMUNITY): Payer: Self-pay | Admitting: Emergency Medicine

## 2019-09-07 ENCOUNTER — Inpatient Hospital Stay (HOSPITAL_COMMUNITY)
Admission: EM | Admit: 2019-09-07 | Discharge: 2019-09-09 | DRG: 303 | Disposition: A | Discharge status: Home / Self Care | Payer: Medicare Other | Attending: Internal Medicine | Admitting: Internal Medicine

## 2019-09-07 DIAGNOSIS — Z79891 Long term (current) use of opiate analgesic: Secondary | ICD-10-CM | POA: Diagnosis not present

## 2019-09-07 DIAGNOSIS — M549 Dorsalgia, unspecified: Secondary | ICD-10-CM | POA: Diagnosis present

## 2019-09-07 DIAGNOSIS — Z20828 Contact with and (suspected) exposure to other viral communicable diseases: Secondary | ICD-10-CM | POA: Diagnosis present

## 2019-09-07 DIAGNOSIS — Z7982 Long term (current) use of aspirin: Secondary | ICD-10-CM | POA: Diagnosis not present

## 2019-09-07 DIAGNOSIS — Z96652 Presence of left artificial knee joint: Secondary | ICD-10-CM | POA: Diagnosis not present

## 2019-09-07 DIAGNOSIS — I25111 Atherosclerotic heart disease of native coronary artery with angina pectoris with documented spasm: Secondary | ICD-10-CM | POA: Diagnosis not present

## 2019-09-07 DIAGNOSIS — Z87891 Personal history of nicotine dependence: Secondary | ICD-10-CM

## 2019-09-07 DIAGNOSIS — G4730 Sleep apnea, unspecified: Secondary | ICD-10-CM | POA: Diagnosis present

## 2019-09-07 DIAGNOSIS — F419 Anxiety disorder, unspecified: Secondary | ICD-10-CM | POA: Diagnosis present

## 2019-09-07 DIAGNOSIS — G2581 Restless legs syndrome: Secondary | ICD-10-CM | POA: Diagnosis not present

## 2019-09-07 DIAGNOSIS — R079 Chest pain, unspecified: Secondary | ICD-10-CM | POA: Diagnosis present

## 2019-09-07 DIAGNOSIS — Z79899 Other long term (current) drug therapy: Secondary | ICD-10-CM | POA: Diagnosis not present

## 2019-09-07 DIAGNOSIS — E785 Hyperlipidemia, unspecified: Secondary | ICD-10-CM | POA: Diagnosis not present

## 2019-09-07 DIAGNOSIS — Z888 Allergy status to other drugs, medicaments and biological substances status: Secondary | ICD-10-CM | POA: Diagnosis not present

## 2019-09-07 DIAGNOSIS — I890 Lymphedema, not elsewhere classified: Secondary | ICD-10-CM | POA: Diagnosis present

## 2019-09-07 DIAGNOSIS — I429 Cardiomyopathy, unspecified: Secondary | ICD-10-CM | POA: Diagnosis present

## 2019-09-07 DIAGNOSIS — I208 Other forms of angina pectoris: Secondary | ICD-10-CM

## 2019-09-07 DIAGNOSIS — Z7951 Long term (current) use of inhaled steroids: Secondary | ICD-10-CM

## 2019-09-07 DIAGNOSIS — Z8249 Family history of ischemic heart disease and other diseases of the circulatory system: Secondary | ICD-10-CM | POA: Diagnosis not present

## 2019-09-07 DIAGNOSIS — H9190 Unspecified hearing loss, unspecified ear: Secondary | ICD-10-CM | POA: Diagnosis not present

## 2019-09-07 DIAGNOSIS — Z885 Allergy status to narcotic agent status: Secondary | ICD-10-CM

## 2019-09-07 DIAGNOSIS — G8929 Other chronic pain: Secondary | ICD-10-CM | POA: Diagnosis not present

## 2019-09-07 LAB — URINALYSIS, ROUTINE W REFLEX MICROSCOPIC
Bilirubin Urine: NEGATIVE
Glucose, UA: NEGATIVE mg/dL
Hgb urine dipstick: NEGATIVE
Ketones, ur: NEGATIVE mg/dL
Leukocytes,Ua: NEGATIVE
Nitrite: NEGATIVE
Protein, ur: NEGATIVE mg/dL
Specific Gravity, Urine: 1.023 (ref 1.005–1.030)
pH: 5 (ref 5.0–8.0)

## 2019-09-07 LAB — CBC
HCT: 39.7 % (ref 36.0–46.0)
Hemoglobin: 12.8 g/dL (ref 12.0–15.0)
MCH: 29.4 pg (ref 26.0–34.0)
MCHC: 32.2 g/dL (ref 30.0–36.0)
MCV: 91.3 fL (ref 80.0–100.0)
Platelets: 276 10*3/uL (ref 150–400)
RBC: 4.35 MIL/uL (ref 3.87–5.11)
RDW: 13.1 % (ref 11.5–15.5)
WBC: 9 10*3/uL (ref 4.0–10.5)
nRBC: 0 % (ref 0.0–0.2)

## 2019-09-07 LAB — BASIC METABOLIC PANEL
Anion gap: 10 (ref 5–15)
BUN: 12 mg/dL (ref 8–23)
CO2: 25 mmol/L (ref 22–32)
Calcium: 9.6 mg/dL (ref 8.9–10.3)
Chloride: 106 mmol/L (ref 98–111)
Creatinine, Ser: 0.56 mg/dL (ref 0.44–1.00)
GFR calc Af Amer: >60 mL/min (ref >60)
GFR calc non Af Amer: >60 mL/min (ref >60)
Glucose, Bld: 116 mg/dL — ABNORMAL HIGH (ref 70–99)
Potassium: 3.9 mmol/L (ref 3.5–5.1)
Sodium: 141 mmol/L (ref 135–145)

## 2019-09-07 LAB — TROPONIN I (HIGH SENSITIVITY)
Troponin I (High Sensitivity): 34 ng/L — ABNORMAL HIGH (ref <18)
Troponin I (High Sensitivity): 32 ng/L — ABNORMAL HIGH (ref <18)

## 2019-09-07 LAB — CBG MONITORING, ED: Glucose-Capillary: 102 mg/dL — ABNORMAL HIGH (ref 70–99)

## 2019-09-07 MED ORDER — HYDROCODONE-ACETAMINOPHEN 7.5-325 MG PO TABS
1.0000 | ORAL_TABLET | Freq: Four times a day (QID) | ORAL | Status: DC | PRN
  Administered 2019-09-07: 1 via ORAL
  Filled 2019-09-07: qty 1

## 2019-09-07 MED ORDER — ENOXAPARIN SODIUM 40 MG/0.4ML ~~LOC~~ SOLN
40.0000 mg | SUBCUTANEOUS | Status: DC
  Administered 2019-09-08 – 2019-09-09 (×2): 40 mg via SUBCUTANEOUS
  Filled 2019-09-07 (×3): qty 0.4

## 2019-09-07 MED ORDER — ACETAMINOPHEN 325 MG PO TABS
650.0000 mg | ORAL_TABLET | Freq: Three times a day (TID) | ORAL | Status: DC | PRN
  Administered 2019-09-07 – 2019-09-08 (×2): 650 mg via ORAL
  Filled 2019-09-07 (×2): qty 2

## 2019-09-07 MED ORDER — KETOROLAC TROMETHAMINE 15 MG/ML IJ SOLN: 15.0000 mg | Freq: Once | INTRAMUSCULAR | Status: DC

## 2019-09-07 MED ORDER — MECLIZINE HCL 25 MG PO TABS: 25.0000 mg | ORAL_TABLET | Freq: Two times a day (BID) | ORAL | Status: DC | PRN

## 2019-09-07 MED ORDER — SERTRALINE HCL 50 MG PO TABS
50.0000 mg | ORAL_TABLET | Freq: Every day | ORAL | Status: DC
  Administered 2019-09-07 – 2019-09-09 (×3): 50 mg via ORAL
  Filled 2019-09-07 (×3): qty 1

## 2019-09-07 MED ORDER — SODIUM CHLORIDE 0.9% FLUSH: 3.0000 mL | Freq: Once | INTRAVENOUS | Status: DC

## 2019-09-07 MED ORDER — ACETAMINOPHEN 650 MG RE SUPP: 650.0000 mg | Freq: Four times a day (QID) | RECTAL | Status: DC | PRN

## 2019-09-07 MED ORDER — ASPIRIN 81 MG PO CHEW
162.0000 mg | CHEWABLE_TABLET | Freq: Every day | ORAL | Status: DC
  Administered 2019-09-08 – 2019-09-09 (×2): 162 mg via ORAL
  Filled 2019-09-07 (×2): qty 2

## 2019-09-07 MED ORDER — ACETAMINOPHEN 650 MG RE SUPP: 650.0000 mg | Freq: Three times a day (TID) | RECTAL | Status: DC | PRN

## 2019-09-07 MED ORDER — ROPINIROLE HCL 1 MG PO TABS
1.0000 mg | ORAL_TABLET | Freq: Every day | ORAL | Status: DC
  Administered 2019-09-07 – 2019-09-08 (×2): 1 mg via ORAL
  Filled 2019-09-07 (×3): qty 1

## 2019-09-07 MED ORDER — NITROGLYCERIN 0.4 MG SL SUBL
0.4000 mg | SUBLINGUAL_TABLET | Freq: Once | SUBLINGUAL | Status: AC
  Administered 2019-09-07: 0.4 mg via SUBLINGUAL
  Filled 2019-09-07: qty 1

## 2019-09-07 MED ORDER — DIAZEPAM 5 MG PO TABS
5.0000 mg | ORAL_TABLET | Freq: Four times a day (QID) | ORAL | Status: DC | PRN
  Administered 2019-09-09: 5 mg via ORAL
  Filled 2019-09-07: qty 1

## 2019-09-07 MED ORDER — ACETAMINOPHEN 325 MG PO TABS: 650.0000 mg | ORAL_TABLET | Freq: Four times a day (QID) | ORAL | Status: DC | PRN

## 2019-09-07 MED ORDER — NITROGLYCERIN 0.4 MG SL SUBL
0.4000 mg | SUBLINGUAL_TABLET | Freq: Once | SUBLINGUAL | Status: AC
  Administered 2019-09-07: 0.4 mg via SUBLINGUAL

## 2019-09-07 NOTE — ED Triage Notes (Signed)
  EMS stated, pt started having dizziness with CP after she woke up this morning. She took 4 baby ASA and 1 NTG and the nitro helped that was about 0300, Did not go back to sleep just dozing off and on.

## 2019-09-07 NOTE — ED Notes (Signed)
No Diet was ordered for Dinner.

## 2019-09-07 NOTE — ED Provider Notes (Signed)
[place] [facility] EMERGENCY DEPARTMENT Provider Note   CSN: [id number] Arrival date & time: 09/07/19  1048     History   Chief Complaint Chief Complaint  Patient presents with  . Chest Pain  . Dizziness    HPI Kristin Roach is a 78 y.o. female with a history of asthma, sleep apnea on CPAP at night, as well as Prinzmetal's angina who presents to the emergency department with complaints of chest pain intermittently for the past 2 weeks.  Patient states the pain is an aching/pressure sensation to her left chest that wraps around to her back.  She states it comes and goes without specific triggers, it is alleviated by nitroglycerin which she last took around 3 AM this morning, no other alleviating or aggravating factors.  No change with a deep breath or specifically with exertion.  She states the pain is getting progressively worse each time she has it, she states that last night she became nauseated and dizzy with it.  She does not feel dizzy or nauseous currently.  Her current discomfort is a 3 out of 10 in severity. She does also state that she also has had symptoms of shortness of breath and diaphoresis with exertion for the past few months.  She does not necessarily have any chest pain, shortness of breath, and diaphoresis concomitantly.  Denies fever, chills, URI symptoms,  vomiting, visual disturbance, numbness, weakness, or syncope.  She states that she has ankle swelling bilaterally which is baseline for her no acute changes. Denies , hemoptysis, recent surgery/trauma, recent long travel, hormone use, personal hx of cancer, or hx of DVT/PE.  She states she took 2 aspirin and EMS team gave her an additional 2 for a total of 324 prior to arrival.  She is followed by [facility]-sees Dr. Harrell Gave, she has an appointment scheduled for this Tuesday.  She states she has had 2 cardiac catheterizations previously but does not member the results of these are what exactly they were but it has  been a while.  She states that her mother died of an MI at age 50.    HPI  Past Medical History:  Diagnosis Date  . Angina at rest [facility])   . Arthritis   . Asthma    bronchial asthma  . Cataracts, bilateral   . Complication of anesthesia    woke up during back procedure  . HOH (hard of hearing)   . Pneumonia    hx of 2012  . PONV (postoperative nausea and vomiting)    with knee surgery in 11 2015  . RLS (restless legs syndrome)   . Sleep apnea    cpap    Patient Active Problem List   Diagnosis Date Noted  . Arthritis of left knee Primary 10/26/2014    Past Surgical History:  Procedure Laterality Date  . ABDOMINAL HYSTERECTOMY    . BACK SURGERY     laminectomy  . BELPHAROPTOSIS REPAIR    . CARDIAC CATHETERIZATION    . CESAREAN SECTION    . CHOLECYSTECTOMY    . HERNIA REPAIR    . SHOULDER SURGERY     removal of bone spur  . TONSILLECTOMY    . TOTAL KNEE ARTHROPLASTY Left 10/26/2014   Procedure: LEFT TOTAL KNEE ARTHROPLASTY;  Surgeon: Kerin Salen, MD;  Location: [place];  Service: Orthopedics;  Laterality: Left;     OB History   No obstetric history on file.      Home Medications  Prior to Admission medications   Medication Sig Start Date End Date Taking? Authorizing Provider  aspirin 81 MG tablet Take 243 mg by mouth daily.    [provider]  budesonide-formoterol (SYMBICORT) 80-4.5 MCG/ACT inhaler Inhale 2 puffs into the lungs as needed.     [provider]  diazepam (VALIUM) 5 MG tablet Take 5 mg by mouth every 6 (six) hours as needed for anxiety.  06/13/17   [provider]  HYDROcodone-acetaminophen (NORCO) 10-325 MG per tablet Take 1 tablet by mouth every 6 (six) hours as needed for severe pain. Patient not taking: Reported on 09/23/2018 10/26/14   Leighton Parody, PA-C  HYDROcodone-acetaminophen [facility]) 7.5-325 MG tablet Take 1 tablet by mouth 4 (four) times daily as needed for moderate pain.    [provider]   mupirocin ointment (BACTROBAN) 2 % Apply 1 application topically 3 (three) times daily. 09/20/18   [provider]  nitroGLYCERIN (NITROSTAT) 0.4 MG SL tablet Place 1 tablet (0.4 mg total) under the tongue every 5 (five) minutes as needed for chest pain. 07/30/18   Buford Dresser, MD  rOPINIRole (REQUIP) 1 MG tablet Take 1 mg by mouth at bedtime.    [provider]  sertraline (ZOLOFT) 50 MG tablet Take 50 mg by mouth daily.  12/04/17   [provider]  tizanidine (ZANAFLEX) 2 MG capsule Take 1 capsule (2 mg total) by mouth 3 (three) times daily. Patient not taking: Reported on 07/30/2018 10/26/14   Leighton Parody, PA-C    Family History No family history on file.  Social History Social History   Tobacco Use  . Smoking status: Never Smoker  . Smokeless tobacco: Never Used  Substance Use Topics  . Alcohol use: Not Currently    Alcohol/week: 14.0 standard drinks    Types: 14 Glasses of wine per week  . Drug use: No     Allergies   Epinephrine, Ergonovine, Demerol [meperidine], Erythromycin, Flagyl [metronidazole], and Percocet [oxycodone-acetaminophen]   Review of Systems Review of Systems  Constitutional: Positive for diaphoresis. Negative for chills and fever.  HENT: Negative for congestion, ear pain and sore throat.   Eyes: Negative for visual disturbance.  Respiratory: Positive for shortness of breath. Negative for cough.   Cardiovascular: Positive for chest pain and leg swelling (To ankles without acute change.).  Gastrointestinal: Positive for nausea. Negative for abdominal pain, blood in stool, constipation, diarrhea and vomiting.  Neurological: Positive for dizziness. Negative for syncope, weakness, numbness and headaches.  All other systems reviewed and are negative.    Physical Exam Updated Vital Signs BP (!) 130/114 (BP Location: Right Arm)   Pulse 73   Temp 99 F (37.2 C)   Resp 17   SpO2 94%   Physical Exam Vitals signs  and nursing note reviewed.  Constitutional:      General: She is not in acute distress.    Appearance: She is well-developed. She is not toxic-appearing.  HENT:     Head: Normocephalic and atraumatic.  Eyes:     General:        Right eye: No discharge.        Left eye: No discharge.     Conjunctiva/sclera: Conjunctivae normal.  Neck:     Musculoskeletal: Neck supple.  Cardiovascular:     Rate and Rhythm: Normal rate and regular rhythm.     Pulses:          Radial pulses are 2+ on the right side and 2+ on  the left side.       Dorsalis pedis pulses are 2+ on the right side and 2+ on the left side.  Pulmonary:     Effort: Pulmonary effort is normal. No respiratory distress.     Breath sounds: Normal breath sounds. No wheezing, rhonchi or rales.  Abdominal:     General: There is no distension.     Palpations: Abdomen is soft.     Tenderness: There is no abdominal tenderness.  Musculoskeletal:     Right lower leg: She exhibits no tenderness. No edema.     Left lower leg: She exhibits no tenderness. No edema.  Skin:    General: Skin is warm and dry.     Findings: No rash.  Neurological:     General: No focal deficit present.     Mental Status: She is alert.     Comments: Clear speech.   Psychiatric:        Behavior: Behavior normal.     ED Treatments / Results  Labs (all labs ordered are listed, but only abnormal results are displayed) Labs Reviewed  BASIC METABOLIC PANEL - Abnormal; Notable for the following components:      Result Value   Glucose, Bld 116 (*)    All other components within normal limits  CBG MONITORING, ED - Abnormal; Notable for the following components:   Glucose-Capillary 102 (*)    All other components within normal limits  TROPONIN I (HIGH SENSITIVITY) - Abnormal; Notable for the following components:   Troponin I (High Sensitivity) 34 (*)    All other components within normal limits  CBC  URINALYSIS, ROUTINE W REFLEX MICROSCOPIC  TROPONIN I  (HIGH SENSITIVITY)    EKG EKG Interpretation  Date/Time:  Sunday September 07 2019 11:27:15 EDT Ventricular Rate:  65 PR Interval:  162 QRS Duration: 132 QT Interval:  420 QTC Calculation: 436 R Axis:   -51 Text Interpretation:  Normal sinus rhythm Left axis deviation Left bundle branch block Abnormal ECG Confirmed by Madalyn Rob [phone]) on 09/07/2019 12:31:27 PM   Radiology Dg Chest Portable 1 View  Result Date: 09/07/2019 CLINICAL DATA:  Chest pain and dizziness onset this morning. History of asthma. EXAM: PORTABLE CHEST 1 VIEW COMPARISON:  Chest x-ray dated 09/23/2018. FINDINGS: Borderline cardiomegaly, stable. Lungs are clear. No pleural effusion or pneumothorax seen. Osseous structures about the chest are unremarkable. IMPRESSION: 1. No active disease. No evidence of pneumonia or pulmonary edema. 2. Stable borderline cardiomegaly. Electronically Signed   By: Franki Cabot M.D.   On: 09/07/2019 13:36    Procedures Procedures (including critical care time)  Medications Ordered in ED Medications  sodium chloride flush (NS) 0.9 % injection 3 mL (has no administration in time range)     Initial Impression / Assessment and Plan / ED Course  I have reviewed the triage vital signs and the nursing notes.  Pertinent labs & imaging results that were available during my care of the patient were reviewed by me and considered in my medical decision making (see chart for details).   Patient presents to the emergency department with complaints of chest pain intermittently for the past 2 weeks that is progressively worsening.  She also had some exertional shortness of breath/diaphoresis over the past few months.  She is nontoxic-appearing, resting fairly comfortably, vitals are significant normality, diastolic BP elevated.  Exam is benign.  DDX: ACS, PE, dissection, pneumothorax, anemia, electrolyte derangement, arrhythmia, anxiety, MSK.  Work-up per triage reviewed: CBC: No  leukocytosis or anemia BMP: No significant derangement, electrolytes WNL CBG: 102 Initial troponin: Elevated at 34 EKG: No STEMI, LBBB, without significant change from prior.  We will add on chest x-ray. She has received 324 mg of aspirin prior to my assessment. Her current pain is a 3 out of 10, will give sublingual nitroglycerin and reassess.  Plan to discuss with cardiology    CXR: 1. No active disease. No evidence of pneumonia or pulmonary edema. 2. Stable borderline cardiomegaly.   Low risk Wells, suspicion for PE is fairly low. No widened mediastinum on imaging, symmetric radial pulses, doubt dissection. Lab work and imaging otherwise reassuring.  13:13: CONSULT: Discussed with cardiologist Dr. Lovena Le, recommends admission to medicine for observation.  On reassessment patient is having improvement in pain following nitroglycerin, states it is minimally still there, will trial additional nitroglycerin tablet.  14:11: CONSULT: Discussed with internal medicine service, accepts admission.  Findings and plan of care discussed with supervising physician Dr. Roslynn Amble who has evaluated patient & is in agreement.   Kristin Roach was evaluated in Emergency Department on 09/07/2019 for the symptoms described in the history of present illness. He/she was evaluated in the context of the global COVID-19 pandemic, which necessitated consideration that the patient might be at risk for infection with the SARS-CoV-2 virus that causes COVID-19. Institutional protocols and algorithms that pertain to the evaluation of patients at risk for COVID-19 are in a state of rapid change based on information released by regulatory bodies including the CDC and federal and state organizations. These policies and algorithms were followed during the patient's care in the ED.  Final Clinical Impressions(s) / ED Diagnoses   Final diagnoses:  Chest pain, unspecified type    ED Discharge Orders    None        Amaryllis Dyke, PA-C 09/07/19 1413    Lucrezia Starch, MD 09/08/19 [phone]

## 2019-09-07 NOTE — ED Triage Notes (Signed)
18g. Left AC

## 2019-09-07 NOTE — ED Notes (Signed)
Unsuccessful attempt to call report  rn will call back

## 2019-09-07 NOTE — ED Notes (Signed)
Report called to christy on 6e

## 2019-09-07 NOTE — H&P (Signed)
Date: 09/07/2019               Patient Name:  Kristin Roach MRN: [id number]  DOB: 11-22-1941 Age / Sex: 78 y.o., female   PCP: Jettie Booze, NP         Medical Service: Internal Medicine Teaching Service         Attending Physician: Dr. Lucrezia Starch, MD    First Contact: Dr. Sheppard Coil Pager: [phone]  Second Contact: Dr. Berline Lopes  Pager: [phone]       After Hours (After 5p/  First Contact Pager: [phone]  weekends / holidays): Second Contact Pager: [phone]   Chief Complaint: Chest pain   History of Present Illness: Kristin Roach is a 78 year old female with a past medical history significant for Prinzmetal angina, anxiety, asthma, sleep apnea, and chronic back pain who presented to the ED with chest pain. The patient has had angina for the past 20 years, but she says this past chest pain felt different than what she was used to. She describes it as a sharp aching pain that starts under her left breast and spreads around her L torso to her back. The pain has been increasing in severity over the past 2 weeks. Last night was when it was at its worst (9/10 severity) so she is decided to come to the hospital. The patient states that she has been helping her daughter move in the past couple weeks, but does not feel like she is overexerting herself. She is currently not in pain. She has taken nitroglycerin about 4 times in the past 2 weeks which has given significant relief. She has also complained about some transient dizziness, cough, mild SOB, increased fatigue and sleeping. She denies any vision changes, headaches, colds sweats or clamminess during chest pain episodes, or having pain radiate to her arm and jaw. No abdominal pain, nausea or vomiting, or changes in her bowel or bladder habits.  In the ED, a CBC, BMP, troponin, urinalysis was performed. Troponin were slightly elevated to 34 and 32 on repeat.  CBC, complete BMP and urinalysis were unremarkable. An EKG was also performed and  was notable for a widened QRS complex and LBBB, but did not show ST changes that would suggest an ischemic process at this time.  Meds:  No outpatient medications have been marked as taking for the 09/07/19 encounter [facility] Encounter).   Allergies: Allergies as of 09/07/2019 - Review Complete 09/07/2019  Allergen Reaction Noted  . Ergonovine Other (See Comments) 03/06/2018  . Demerol [meperidine] Itching 10/14/2014  . Erythromycin Nausea And Vomiting 10/14/2014  . Flagyl [metronidazole] Hives 10/14/2014  . Percocet [oxycodone-acetaminophen] Other (See Comments) 10/26/2014   Past Medical History:  Diagnosis Date  . Angina at rest [facility])   . Arthritis   . Asthma    bronchial asthma  . Cataracts, bilateral   . Complication of anesthesia    woke up during back procedure  . HOH (hard of hearing)   . Pneumonia    hx of 2012  . PONV (postoperative nausea and vomiting)    with knee surgery in 11 2015  . RLS (restless legs syndrome)   . Sleep apnea    cpap   Past Surgical History:  Procedure Laterality Date  . ABDOMINAL HYSTERECTOMY    . BACK SURGERY     laminectomy  . BELPHAROPTOSIS REPAIR    . CARDIAC CATHETERIZATION    . CESAREAN SECTION    . CHOLECYSTECTOMY    .  HERNIA REPAIR    . SHOULDER SURGERY     removal of bone spur  . TONSILLECTOMY    . TOTAL KNEE ARTHROPLASTY Left 10/26/2014   Procedure: LEFT TOTAL KNEE ARTHROPLASTY;  Surgeon: Kerin Salen, MD;  Location: [place];  Service: Orthopedics;  Laterality: Left;   Imaging: EKG:  Normal sinus rhythm Left axis deviation Left bundle branch block No ST changes suggestive of an ischemic process  CXR: IMPRESSION: 1. No active disease. No evidence of pneumonia or pulmonary edema. 2. Stable borderline cardiomegaly.  Family History:  - Mother passed away from heart attack - Father passed away from leukemia - Sister had lung cancer   Social History:  - Lives at home by herself and able to complete ADLs and IADLs.   - Has a son and daughter who lives in [place] - Used to smoke cigarettes 40 years ago. Smoked 2-1/2 packs a day for 7 years - No recreational drug use  Review of Systems: All systems were reviewed and are otherwise negative unless mentioned in the HPI.  Physical Exam: Blood pressure (!) 145/73, pulse 75, temperature 99 F (37.2 C), resp. rate 14, SpO2 96 %.  Physical Exam Vitals signs reviewed.  Constitutional:      General: She is not in acute distress.    Appearance: Normal appearance. She is not ill-appearing, toxic-appearing or diaphoretic.  HENT:     Head: Normocephalic and atraumatic.  Eyes:     Extraocular Movements: Extraocular movements intact.  Cardiovascular:     Rate and Rhythm: Normal rate and regular rhythm.     Pulses: Normal pulses.     Heart sounds: Normal heart sounds. No murmur. No friction rub. No gallop.      Comments: No JVD noted Pulmonary:     Effort: Pulmonary effort is normal. No respiratory distress.     Breath sounds: Normal breath sounds. No wheezing or rales.  Chest:     Chest wall: Tenderness (Tenderness to palpation under L breast) present.  Abdominal:     General: Abdomen is flat. Bowel sounds are normal. There is no distension.     Palpations: Abdomen is soft.     Tenderness: There is no abdominal tenderness. There is no guarding.  Musculoskeletal:     Right lower leg: Edema present.     Left lower leg: Edema present.  Skin:    General: Skin is warm.  Neurological:     General: No focal deficit present.     Mental Status: She is alert and oriented to person, place, and time.  Psychiatric:        Mood and Affect: Mood normal.    Assessment & Plan by Problem: Active Problems:   * No active hospital problems. *  In summary, Kristin Roach is a 78 year old female with a past medical history significant for Prinzmetal angina, anxiety, asthma, sleep apnea, and chronic back pain who presented to the ED with left chest wall pain that radiates  to the flank over the past 2 weeks and was responsive to nitroglycerin, concerning for ischemic heart disease. The patients troponins were slightly elevated to 34 and 32 and EKG was negative for an ischemic process. The patient has a long history of Prinzmetal angina, so this may be the cause of her chest pain.  Chest x-ray was unremarkable, lowering her suspicion for a pulmonary process.  #Fatigue #SOB #Hx Prinzmetal angina  #Chest Pain: Work-up so far does not appear to be significant for ischemic cardiac event  or pulmonary process, given a negative CXR, no ST changes on EKG, and slightly elevated troponins.  However given the patient's new fatigue, chest pain and mild SOB, is reasonable to rule out structural heart disease via echo cardiology is following the patient, we will appreciate their recommendations.  - Echocardiogram ordered - TSH ordered - Aspirin 243 mg daily  #Hx of anxiety: Takes Zoloft 50 mg daily and Valium  5 mg q6hr PRN at home. Also takes ropinirole nightly for restless legs.  - Continue home Zoloft 50 mg  - Continue home Valium as needed - Continue home ropinirole 1 mg nightly  #Shoulder pain  #Chronic back pain: From lower back laminectomy and torn rotator cuff - Norco 7.5-325 mg every 4 hours as needed - Tylenol 650 mg q8hrs PRN  #DVT prophylaxis - Lovenox 40 mg subq injections daily  #FEN/GI - Diet: NPO - Fluids: None  #Code status: FULL  #Dispo: Admit patient to Observation with expected length of stay less than 2 midnights.  Signed: [name], MD Internal Medicine, PGY1 Pager: [phone]  09/07/2019,3:36 PM

## 2019-09-07 NOTE — Progress Notes (Signed)
Patient stated she did not want to try CPAP for the night. Instructed patient that if she changes her mind to let the nurse know and respiratory would be happy to bring one to her room.

## 2019-09-07 NOTE — ED Notes (Signed)
No chest pain headache only still

## 2019-09-07 NOTE — ED Notes (Signed)
Pt waiting for a call bell to be delivered  Just moved over here wires are not available  C.o a headache after she took sl nitro  Asking for tylenol

## 2019-09-08 ENCOUNTER — Observation Stay (HOSPITAL_BASED_OUTPATIENT_CLINIC_OR_DEPARTMENT_OTHER): Payer: Medicare Other

## 2019-09-08 ENCOUNTER — Inpatient Hospital Stay (HOSPITAL_COMMUNITY): Payer: Medicare Other

## 2019-09-08 ENCOUNTER — Other Ambulatory Visit: Payer: Self-pay

## 2019-09-08 ENCOUNTER — Encounter (HOSPITAL_COMMUNITY): Payer: Self-pay | Admitting: Medical

## 2019-09-08 DIAGNOSIS — I447 Left bundle-branch block, unspecified: Secondary | ICD-10-CM

## 2019-09-08 DIAGNOSIS — H9190 Unspecified hearing loss, unspecified ear: Secondary | ICD-10-CM | POA: Diagnosis not present

## 2019-09-08 DIAGNOSIS — F419 Anxiety disorder, unspecified: Secondary | ICD-10-CM | POA: Diagnosis not present

## 2019-09-08 DIAGNOSIS — Z20828 Contact with and (suspected) exposure to other viral communicable diseases: Secondary | ICD-10-CM | POA: Diagnosis not present

## 2019-09-08 DIAGNOSIS — G2581 Restless legs syndrome: Secondary | ICD-10-CM

## 2019-09-08 DIAGNOSIS — I25111 Atherosclerotic heart disease of native coronary artery with angina pectoris with documented spasm: Secondary | ICD-10-CM | POA: Diagnosis not present

## 2019-09-08 DIAGNOSIS — Z885 Allergy status to narcotic agent status: Secondary | ICD-10-CM | POA: Diagnosis not present

## 2019-09-08 DIAGNOSIS — I429 Cardiomyopathy, unspecified: Secondary | ICD-10-CM | POA: Diagnosis not present

## 2019-09-08 DIAGNOSIS — Z79899 Other long term (current) drug therapy: Secondary | ICD-10-CM | POA: Diagnosis not present

## 2019-09-08 DIAGNOSIS — Z79891 Long term (current) use of opiate analgesic: Secondary | ICD-10-CM

## 2019-09-08 DIAGNOSIS — Z87891 Personal history of nicotine dependence: Secondary | ICD-10-CM | POA: Diagnosis not present

## 2019-09-08 DIAGNOSIS — J45909 Unspecified asthma, uncomplicated: Secondary | ICD-10-CM

## 2019-09-08 DIAGNOSIS — G4730 Sleep apnea, unspecified: Secondary | ICD-10-CM | POA: Diagnosis not present

## 2019-09-08 DIAGNOSIS — I201 Angina pectoris with documented spasm: Secondary | ICD-10-CM | POA: Diagnosis not present

## 2019-09-08 DIAGNOSIS — I890 Lymphedema, not elsewhere classified: Secondary | ICD-10-CM | POA: Diagnosis not present

## 2019-09-08 DIAGNOSIS — R079 Chest pain, unspecified: Secondary | ICD-10-CM | POA: Diagnosis present

## 2019-09-08 DIAGNOSIS — E785 Hyperlipidemia, unspecified: Secondary | ICD-10-CM | POA: Diagnosis not present

## 2019-09-08 DIAGNOSIS — M549 Dorsalgia, unspecified: Secondary | ICD-10-CM

## 2019-09-08 DIAGNOSIS — I340 Nonrheumatic mitral (valve) insufficiency: Secondary | ICD-10-CM

## 2019-09-08 DIAGNOSIS — G8929 Other chronic pain: Secondary | ICD-10-CM

## 2019-09-08 DIAGNOSIS — Z7951 Long term (current) use of inhaled steroids: Secondary | ICD-10-CM | POA: Diagnosis not present

## 2019-09-08 DIAGNOSIS — Z96652 Presence of left artificial knee joint: Secondary | ICD-10-CM | POA: Diagnosis not present

## 2019-09-08 DIAGNOSIS — R072 Precordial pain: Secondary | ICD-10-CM

## 2019-09-08 DIAGNOSIS — Z888 Allergy status to other drugs, medicaments and biological substances status: Secondary | ICD-10-CM | POA: Diagnosis not present

## 2019-09-08 DIAGNOSIS — Z8249 Family history of ischemic heart disease and other diseases of the circulatory system: Secondary | ICD-10-CM | POA: Diagnosis not present

## 2019-09-08 DIAGNOSIS — M25519 Pain in unspecified shoulder: Secondary | ICD-10-CM

## 2019-09-08 DIAGNOSIS — Z7982 Long term (current) use of aspirin: Secondary | ICD-10-CM | POA: Diagnosis not present

## 2019-09-08 LAB — COMPREHENSIVE METABOLIC PANEL
ALT: 21 U/L (ref 0–44)
AST: 19 U/L (ref 15–41)
Albumin: 3.7 g/dL (ref 3.5–5.0)
Alkaline Phosphatase: 67 U/L (ref 38–126)
Anion gap: 10 (ref 5–15)
BUN: 15 mg/dL (ref 8–23)
CO2: 26 mmol/L (ref 22–32)
Calcium: 9 mg/dL (ref 8.9–10.3)
Chloride: 104 mmol/L (ref 98–111)
Creatinine, Ser: 0.58 mg/dL (ref 0.44–1.00)
GFR calc Af Amer: >60 mL/min (ref >60)
GFR calc non Af Amer: >60 mL/min (ref >60)
Glucose, Bld: 104 mg/dL — ABNORMAL HIGH (ref 70–99)
Potassium: 3.6 mmol/L (ref 3.5–5.1)
Sodium: 140 mmol/L (ref 135–145)
Total Bilirubin: 0.6 mg/dL (ref 0.3–1.2)
Total Protein: 6.4 g/dL — ABNORMAL LOW (ref 6.5–8.1)

## 2019-09-08 LAB — ECHOCARDIOGRAM COMPLETE
Height: 58 in
Weight: 2660.8 oz

## 2019-09-08 LAB — CBC
HCT: 37.9 % (ref 36.0–46.0)
Hemoglobin: 12.5 g/dL (ref 12.0–15.0)
MCH: 29.8 pg (ref 26.0–34.0)
MCHC: 33 g/dL (ref 30.0–36.0)
MCV: 90.5 fL (ref 80.0–100.0)
Platelets: 259 10*3/uL (ref 150–400)
RBC: 4.19 MIL/uL (ref 3.87–5.11)
RDW: 13.1 % (ref 11.5–15.5)
WBC: 9 10*3/uL (ref 4.0–10.5)
nRBC: 0 % (ref 0.0–0.2)

## 2019-09-08 LAB — SARS CORONAVIRUS 2 (TAT 6-24 HRS): SARS Coronavirus 2: NEGATIVE

## 2019-09-08 LAB — TSH: TSH: 0.869 u[IU]/mL (ref 0.350–4.500)

## 2019-09-08 MED ORDER — NITROGLYCERIN 0.4 MG SL SUBL
SUBLINGUAL_TABLET | SUBLINGUAL | Status: AC
  Filled 2019-09-08: qty 2

## 2019-09-08 MED ORDER — NITROGLYCERIN 0.4 MG SL SUBL
0.8000 mg | SUBLINGUAL_TABLET | Freq: Once | SUBLINGUAL | Status: AC
  Administered 2019-09-08: 0.8 mg via SUBLINGUAL

## 2019-09-08 MED ORDER — IOHEXOL 350 MG/ML SOLN
80.0000 mL | Freq: Once | INTRAVENOUS | Status: AC | PRN
  Administered 2019-09-08: 80 mL via INTRAVENOUS

## 2019-09-08 NOTE — Consult Note (Addendum)
Cardiology Consultation:   Patient ID: Kristin Roach MRN: [id number]; DOB: 12-11-1941  Admit date: 09/07/2019 Date of Consult: 09/08/2019  Primary Care Provider: Jettie Booze, NP Primary Cardiologist: Buford Dresser, MD  Primary Electrophysiologist:  None    Patient Profile:   Kristin Roach is a 78 y.o. female with a hx of Prinzmetal's angina, asthma, chronic lymphedema, chronic back pain, anxiety, and sleep apnea on CPAP at night who is being seen today for the evaluation of chest pain at the request of Dr. Dareen Piano.  History of Present Illness:   The patient established care with Dr. Harrell Gave for follow-up up Prinzmetal's Angina.The patient moved from [place] some years before and was overall very active and healthy. Prinzmetal's angina is reportedly a chronic diagnosis from 1990s. Patient reports 2 remote cardiac catheterizations back then that did not require stents. Reports h/o of a ?normal stress test (2012) and ?normal echo (2017). The patient has 2-3 episodes of prinzmetel's angina a year. It is a left sided burning pressure that goes to her back, down her left arm, with occasional nausea.. Pain is relieved with NTG and Valium. Episodes normally come on at rest. Her last episode was 2-3 weeks ago.   [name] presented to the ED for chest pain that felt different from her normal anginal episodes. 2 nights ago the patient noticed gradually worsening pain on the lower left side of her chest. The pain wrapped around to the back. Pain came on at rest and was an achhing pressure with no radiation down the arm. At it's worst the pain was 9/10. The pain was unchanged with exertion or breathing. She did feel mildly short of breath as well, but no diaphoresis related to the pain. She noticed the pain was progressively worse, to the point she felt dizzy or nauseous. She took her normal NTG and Valium which only mildly relieved the pain, but the pain got worse. Denies fever, chills, vomiting.  She does have bilateral ankle swelling which is normal for her form her chronic lymphedema. Before arrival to the ED she took NTG x1 and 2 ASA. She got an additional ASA from EMS.   In the ED BP 130/114, pulse 73, Temp 99, RR 17. Labs showed potassium 3.9, glucose 116, Creatinine 0.56. LFTs were normal. WBC 9.0, Hgb 12.8. HS troponin 34 > 32. COVID negative. CXR negative for active disease. UA negative for infection. Patient received NTG in the ER and pain finally improved. The patient admitted she had taking NTG about 4 times in the last 2 weeks for anginal symptoms which relieved the pain. She reported she has felt more tired in the last couple weeks and has been having intermittent diaphoresis with no known trigger. Patient was admitted for further work-up.  Today the patient is chest pain free. She denies history of diabetes, HTN, and current tobacco use. Patient smoked in her 7s. Denies alcohol and drug use. Family history includes her mother who passed from an MI in her 51s. Both her grandparents died from an MI. She has chronic lymphedema and uses compression stockings routinely.   Heart Pathway Score:     Past Medical History:  Diagnosis Date  . Angina at rest [facility])   . Arthritis   . Asthma    bronchial asthma  . Cataracts, bilateral   . Complication of anesthesia    woke up during back procedure  . HOH (hard of hearing)   . Pneumonia    hx of 2012  .  PONV (postoperative nausea and vomiting)    with knee surgery in 11 2015  . RLS (restless legs syndrome)   . Sleep apnea    cpap    Past Surgical History:  Procedure Laterality Date  . ABDOMINAL HYSTERECTOMY    . BACK SURGERY     laminectomy  . BELPHAROPTOSIS REPAIR    . CARDIAC CATHETERIZATION    . CESAREAN SECTION    . CHOLECYSTECTOMY    . HERNIA REPAIR    . SHOULDER SURGERY     removal of bone spur  . TONSILLECTOMY    . TOTAL KNEE ARTHROPLASTY Left 10/26/2014   Procedure: LEFT TOTAL KNEE ARTHROPLASTY;  Surgeon: Kerin Salen, MD;  Location: [place];  Service: Orthopedics;  Laterality: Left;     Home Medications:  Prior to Admission medications   Medication Sig Start Date End Date Taking? Authorizing Provider  acetaminophen (TYLENOL) 650 MG CR tablet Take 650 mg by mouth every 8 (eight) hours as needed for pain.   Yes [provider]  albuterol (VENTOLIN HFA) 108 (90 Base) MCG/ACT inhaler Inhale 2 puffs into the lungs every 6 (six) hours as needed for wheezing. 12/13/18  Yes [provider]  aspirin 81 MG tablet Take 162 mg by mouth as needed for pain.    Yes [provider]  budesonide-formoterol (SYMBICORT) 80-4.5 MCG/ACT inhaler Inhale 2 puffs into the lungs as needed.    Yes [provider]  diazepam (VALIUM) 5 MG tablet Take 5 mg by mouth as needed for anxiety (when she takes nitroglycerin).  06/13/17  Yes [provider]  diclofenac sodium (VOLTAREN) 1 % GEL Apply 1 application topically 4 (four) times daily as needed for pain. 04/11/19  Yes [provider]  HYDROcodone-acetaminophen (NORCO) 7.5-325 MG tablet Take 1 tablet by mouth 4 (four) times daily as needed for moderate pain.   Yes [provider]  meclizine (ANTIVERT) 25 MG tablet Take 25 mg by mouth as needed. 04/11/19  Yes [provider]  meloxicam (MOBIC) 7.5 MG tablet Take 7.5 mg by mouth daily. 11/22/18  Yes [provider]  nitroGLYCERIN (NITROSTAT) 0.4 MG SL tablet Place 1 tablet (0.4 mg total) under the tongue every 5 (five) minutes as needed for chest pain. 07/30/18  Yes Buford Dresser, MD  rOPINIRole (REQUIP) 1 MG tablet Take 1 mg by mouth at bedtime.   Yes [provider]  sertraline (ZOLOFT) 50 MG tablet Take 50 mg by mouth daily.  12/04/17  Yes [provider]  HYDROcodone-acetaminophen (NORCO) 10-325 MG per tablet Take 1 tablet by mouth every 6 (six) hours as needed for severe pain. Patient not taking: Reported on 09/23/2018 10/26/14    Leighton Parody, PA-C  mupirocin ointment (BACTROBAN) 2 % Apply 1 application topically 3 (three) times daily. 09/20/18   [provider]  tizanidine (ZANAFLEX) 2 MG capsule Take 1 capsule (2 mg total) by mouth 3 (three) times daily. Patient not taking: Reported on 07/30/2018 10/26/14   Leighton Parody, PA-C    Inpatient Medications: Scheduled Meds: . aspirin  162 mg Oral Daily  . enoxaparin (LOVENOX) injection  40 mg Subcutaneous Q24H  . ketorolac  15 mg Intravenous Once  . rOPINIRole  1 mg Oral QHS  . sertraline  50 mg Oral Daily  . sodium chloride flush  3 mL Intravenous Once   Continuous Infusions:  PRN Meds: acetaminophen **OR** acetaminophen, diazepam, HYDROcodone-acetaminophen, meclizine  Allergies:    Allergies  Allergen Reactions  .  Ergonovine Other (See Comments)    Used in heart study Reaction: Throat closed up   . Demerol [Meperidine] Itching  . Erythromycin Nausea And Vomiting  . Flagyl [Metronidazole] Hives    Large amounts  . Percocet [Oxycodone-Acetaminophen] Other (See Comments)    nightmares    Social History:   Social History   Socioeconomic History  . Marital status: Widowed    Spouse name: Not on file  . Number of children: Not on file  . Years of education: Not on file  . Highest education level: Not on file  Occupational History  . Not on file  Social Needs  . Financial resource strain: Not on file  . Food insecurity    Worry: Not on file    Inability: Not on file  . Transportation needs    Medical: Not on file    Non-medical: Not on file  Tobacco Use  . Smoking status: Never Smoker  . Smokeless tobacco: Never Used  Substance and Sexual Activity  . Alcohol use: Not Currently    Alcohol/week: 14.0 standard drinks    Types: 14 Glasses of wine per week  . Drug use: No  . Sexual activity: Not on file  Lifestyle  . Physical activity    Days per week: Not on file    Minutes per session: Not on file  . Stress: Not on file   Relationships  . Social Herbalist on phone: Not on file    Gets together: Not on file    Attends religious service: Not on file    Active member of club or organization: Not on file    Attends meetings of clubs or organizations: Not on file    Relationship status: Not on file  . Intimate partner violence    Fear of current or ex partner: Not on file    Emotionally abused: Not on file    Physically abused: Not on file    Forced sexual activity: Not on file  Other Topics Concern  . Not on file  Social History Narrative  . Not on file    Family History: Family History  Problem Relation Age of Onset  . Heart attack Mother        (died at 3 from MI)  . Leukemia Father      ROS:  Please see the history of present illness.  All other ROS reviewed and negative.     Physical Exam/Data:   Vitals:   09/07/19 1500 09/07/19 1858 09/07/19 1929 09/08/19 0511  BP: (!) 145/73 (!) 158/80 (!) 152/76 (!) 144/77  Pulse:  67 67 73  Resp: 14     Temp:  98.3 F (36.8 C) 98.3 F (36.8 C) 98 F (36.7 C)  TempSrc:  Oral Oral Oral  SpO2:  97% 98% 96%  Weight:    75.4 kg  Height:    4\' 10"  (1.473 m)    Intake/Output Summary (Last 24 hours) at 09/08/2019 1015 Last data filed at 09/08/2019 0515 Gross per 24 hour  Intake 240 ml  Output 200 ml  Net 40 ml   Last 3 Weights 09/08/2019 12/14/2018 09/09/2018  Weight (lbs) 166 lb 4.8 oz 169 lb 162 lb  Weight (kg) 75.433 kg 76.658 kg 73.483 kg     Body mass index is 34.76 kg/m.  General:  Well nourished, well developed, in no acute distress HEENT: normal Lymph: no adenopathy Neck: no JVD Endocrine:  No thryomegaly Vascular: No carotid bruits;  FA pulses 2+ bilaterally without bruits  Cardiac:  normal S1, S2; RRR; no murmur  Lungs:  clear to auscultation bilaterally, no wheezing, rhonchi or rales  Abd: soft, nontender, no hepatomegaly  Ext: B/L mild edema Musculoskeletal:  No deformities, BUE and BLE strength normal and equal  Skin: warm and dry  Neuro:  CNs 2-12 intact, no focal abnormalities noted Psych:  Normal affect   EKG:  The EKG was personally reviewed and demonstrates:  NSR, 65 bpm, Lad, old LBBB (QRS 163ms), No ST elevation, poor r wave progression Telemetry:  Telemetry was personally reviewed and demonstrates:  NSR, HR 60-70s; no other arrhythmias noted  Relevant CV Studies:  Echo 09/08/19  1. Left ventricular ejection fraction, by visual estimation, is 40 to 45%. The left ventricle has normal function. Normal left ventricular size. There is no left ventricular hypertrophy.  2. Left ventricular diastolic Doppler parameters are consistent with impaired relaxation pattern of LV diastolic filling.  3. Global right ventricle has normal systolic function.The right ventricular size is normal. No increase in right ventricular wall thickness.  4. Left atrial size was normal.  5. Right atrial size was normal.  6. The mitral valve is normal in structure. Mild mitral valve regurgitation. No evidence of mitral stenosis.  7. The tricuspid valve is normal in structure. Tricuspid valve regurgitation is trivial.  8. The aortic valve is normal in structure. Aortic valve regurgitation was not visualized by color flow Doppler. Structurally normal aortic valve, with no evidence of sclerosis or stenosis.  9. The pulmonic valve was normal in structure. Pulmonic valve regurgitation is not visualized by color flow Doppler. 10. Normal pulmonary artery systolic pressure. 11. The inferior vena cava is normal in size with greater than 50% respiratory variability, suggesting right atrial pressure of 3 mmHg.  Laboratory Data:  High Sensitivity Troponin:   Recent Labs  Lab 09/07/19 1123 09/07/19 1318  TROPONINIHS 34* 32*     Chemistry Recent Labs  Lab 09/07/19 1123 09/08/19 0509  NA 141 140  K 3.9 3.6  CL 106 104  CO2 25 26  GLUCOSE 116* 104*  BUN 12 15  CREATININE 0.56 0.58  CALCIUM 9.6 9.0  GFRNONAA >60 >60   GFRAA >60 >60  ANIONGAP 10 10    Recent Labs  Lab 09/08/19 0509  PROT 6.4*  ALBUMIN 3.7  AST 19  ALT 21  ALKPHOS 67  BILITOT 0.6   Hematology Recent Labs  Lab 09/07/19 1123 09/08/19 0509  WBC 9.0 9.0  RBC 4.35 4.19  HGB 12.8 12.5  HCT 39.7 37.9  MCV 91.3 90.5  MCH 29.4 29.8  MCHC 32.2 33.0  RDW 13.1 13.1  PLT 276 259   BNPNo results for input(s): BNP, PROBNP in the last 168 hours.  DDimer No results for input(s): DDIMER in the last 168 hours.   Radiology/Studies:  Dg Chest Portable 1 View  Result Date: 09/07/2019 CLINICAL DATA:  Chest pain and dizziness onset this morning. History of asthma. EXAM: PORTABLE CHEST 1 VIEW COMPARISON:  Chest x-ray dated 09/23/2018. FINDINGS: Borderline cardiomegaly, stable. Lungs are clear. No pleural effusion or pneumothorax seen. Osseous structures about the chest are unremarkable. IMPRESSION: 1. No active disease. No evidence of pneumonia or pulmonary edema. 2. Stable borderline cardiomegaly. Electronically Signed   By: Franki Cabot M.D.   On: 09/07/2019 13:36    Assessment and Plan:   Chest pain/ Prinzmetal's angina Patient with long history of prinzmetal angina who presented 9/20 for chest pain >> burning  pressure with no obvious triggers; mildly relieved by NTG/Valium. Pain is reportedly different than her normal anginal episodes. HS Troponin 34 > 32. EKG did not show ST changes, old LBBB. CXR negative for active disease. - Echo showed EF 40-45% with impaired relaxation - TSH 0.86 - Hgb 12.5 - Remote history of cardiac caths in 1990s not requiring stents - Reported normal stress test in 2012 - Patient does not have many risk factors. With her new/different atypical chest pain and given that the patient has not had an ischemic evaluation in a while can consider Cardiac CT.  - Continue ASA - Will check A1C and Lipids for risk factor stratification  Cardiomyopathy - Unclear if this is new or chronic being that we do not have  records on file - EF 40-45% with impaired relaxation - Can recommend starting low dose BB - No signs of heart failure  LBBB (old)  - first seen since 2019 EKG - Stable  Chronic back pain s/p leminextomy - Pain meds per IM  Anxiety - Meds per IM - Reports Valium improves angina  Chronic Lymphedema - B/L mild ankle edema - Patient uses compression socks and pump 2 hours daily  For questions or updates, please contact [facility] Please consult [email or web address] for contact info under     Signed, Cadence Ninfa Meeker, PA-C  09/08/2019 10:15 AM   I have examined the patient and reviewed assessment and plan and discussed with patient.  Agree with above as stated.    Intermittent chest pain.  Borderline troponin.  Will plan for CTA coronaries to see if there is significant CAD.  All questions answered.  Larae Grooms

## 2019-09-08 NOTE — Progress Notes (Signed)
  Echocardiogram 2D Echocardiogram has been performed.  Johny Chess 09/08/2019, 9:02 AM

## 2019-09-08 NOTE — Progress Notes (Signed)
Subjective: Patient seen at the bedside this morning.  She reports that she is doing much better this morning. Denies chest pain. States had pain once overnight but this pain was in her upper back.  Otherwise has no complaints today. We reassured the patient that we would follow-up with the cardiology doctors to see what work-up is left before she can leave the hospital.  Patient comfortable with plan of care. All questions answers.  Objective:  Echocardiogram: IMPRESSIONS 1. Left ventricular ejection fraction, by visual estimation, is 40 to 45%. The left ventricle has normal function. Normal left ventricular size. There is no left ventricular hypertrophy. 2. Left ventricular diastolic Doppler parameters are consistent with impaired relaxation pattern of LV diastolic filling. 3. Global right ventricle has normal systolic function.The right ventricular size is normal. No increase in right ventricular wall thickness. 4. Left atrial size was normal. 5. Right atrial size was normal. 6. The mitral valve is normal in structure. Mild mitral valve regurgitation. No evidence of mitral stenosis. 7. The tricuspid valve is normal in structure. Tricuspid valve regurgitation is trivial. 8. The aortic valve is normal in structure. Aortic valve regurgitation was not visualized by color flow Doppler. Structurally normal aortic valve, with no evidence of sclerosis or stenosis. 9. The pulmonic valve was normal in structure. Pulmonic valve regurgitation is not visualized by color flow Doppler. 10. Normal pulmonary artery systolic pressure. 11. The inferior vena cava is normal in size with greater than 50% respiratory variability, suggesting right atrial pressure of 3 mmHg.  Vital signs in last 24 hours: Vitals:   09/07/19 1500 09/07/19 1858 09/07/19 1929 09/08/19 0511  BP: (!) 145/73 (!) 158/80 (!) 152/76 (!) 144/77  Pulse:  67 67 73  Resp: 14     Temp:  98.3 F (36.8 C) 98.3 F (36.8 C) 98 F (36.7 C)   TempSrc:  Oral Oral Oral  SpO2:  97% 98% 96%  Weight:    75.4 kg  Height:    4\' 10"  (1.473 m)   Physical exam: General: Resting in bed comfortably, NAD HEENT: NCAT CV: RRR, normal S1 and S2, no murmurs rubs or gallops appreciated PULM: Clear to auscultation bilaterally NEURO: Alert and oriented, no focal deficits  Assessment/Plan:  Active Problems:   Chest pain  In summary, Ms. Madril is a 78 year old female with a past medical history significant for Prinzmetal angina, anxiety, asthma, sleep apnea, and chronic back pain who presented to the ED with left chest wall pain that radiated to the flank over the past 2 weeks and was responsive to nitroglycerin, concerning for ischemic heart disease. The patients intitial troponins were slightly elevated to 34 and 32 and EKG was remarkable for old left bundle branch block but was negative for an ischemic process. CXR unremarkable, low suspicion for pulmonary process.    #Fatigue #SOB #Hx Prinzmetal angina  #Chest Pain: Work-up so far does not appear to be significant for ischemic cardiac event or pulmonary process, given a negative CXR, no ST changes on EKG, and slightly elevated troponins. Echocardiogram notable for EF of 40 to 45% and impaired left ventricular diastolic filling. Patient likely needs a cardiac CT or stress test given the nature of her chest pain symptoms.  Cardiology is following and we will appreciate their recommendations.  -Follow-up cardiology recommendations, may need cardiac CT or stress test. - Aspirin 243 mg daily  #Hx of anxiety: Takes Zoloft 50 mg daily and Valium  5 mg q6hr PRN at home. Also takes ropinirole  nightly for restless legs.  - Continue home Zoloft 50 mg  - Continue home Valium as needed - Continue home ropinirole 1 mg nightly  #Shoulder pain  #Chronic back pain: From lower back laminectomy and torn rotator cuff - Norco 7.5-325 mg every 4 hours as needed - Tylenol 650 mg q8hrs PRN  #DVT prophylaxis  - Lovenox 40 mg subq injections daily  #FEN/GI - Diet: NPO - Fluids: None  #Code status: FULL  #Dispo: Pending medical course.  [name], MD Internal Medicine, PGY1 Pager: [phone]  09/08/2019,12:47 PM

## 2019-09-08 NOTE — Plan of Care (Signed)
  Problem: Pain Managment: Goal: General experience of comfort will improve Outcome: Completed/Met   Problem: Safety: Goal: Ability to remain free from injury will improve Outcome: Completed/Met   Problem: Skin Integrity: Goal: Risk for impaired skin integrity will decrease Outcome: Completed/Met

## 2019-09-08 NOTE — Progress Notes (Signed)
  Received a message from Dr. Meda Coffee that CT showed only mild, nonobstructive disease, mostly in the LAD.  No further cardiac w/u planned at this time. OK to discharge from a cardiac standpoint.  SHe will need a new Rx for SL NTG.  Jettie Booze, MD

## 2019-09-08 NOTE — Progress Notes (Signed)
Pt refuse CPAP for the night. Patient stating she is getting discharged tomorrow and doesn't wish to wear It CPAP tonight

## 2019-09-08 NOTE — Progress Notes (Signed)
  Date: 09/08/2019  Patient name: Kristin Roach  Medical record number: [id number]  Date of birth: [birth year]   I have seen and evaluated Kristin Roach and discussed their care with the Residency Team.  In brief, patient is a 78 year old female with a past medical history of Prinzmetal angina, anxiety, asthma, sleep apnea and chronic back pain who presented to the ED with worsening chest pain x1 day.  Patient has a history of angina over the last 20 years for which she takes nitroglycerin with good relief.  Patient states that her pain on the day prior to admission was different from her normal anginal pain.  Patient describes the pain as sharp under her left breast and spreading around her left torso to her back.  Patient states the pain has been present for about 2 weeks but increased on the night prior to admission.  Pain was 9 out of 10 and so she decided come to the hospital for further evaluation.  Patient will states that she has been helping her daughter move in the past couple weeks.  Patient has taken nitroglycerin about 4 times in the past 2 weeks which is given her some relief.  Patient also complained of some mild shortness of breath and cough as well as increased fatigue over the last 2 weeks.  No palpitations, no lightheadedness, no syncope, no focal weakness, tingling or numbness, no headache, no blurry vision, no nausea or vomiting, diarrhea, no abdominal pain.  Today patient states that she feels well and that her chest pain has resolved.  PMHx, Fam Hx, and/or Soc Hx : As per resident admit note  Vitals:   09/07/19 1929 09/08/19 0511  BP: (!) 152/76 (!) 144/77  Pulse: 67 73  Resp:    Temp: 98.3 F (36.8 C) 98 F (36.7 C)  SpO2: 98% 96%   General: Awake, alert, oriented x3, NAD CVS: Regular rate and rhythm, normal heart sounds Lungs: CTA bilaterally Abdomen: Soft, nontender, nondistended, normoactive bowel sounds Extremities: No edema noted, no tenderness to palpation Neuro:  Oriented x3, no focal deficits appreciated Psych: Normal mood and affect HEENT: Normocephalic, atraumatic  Assessment and Plan: I have seen and evaluated the patient as outlined above. I agree with the formulated Assessment and Plan as detailed in the residents' note, with the following changes:   1.  Chest pain: -Patient presented to the ED with progressively worsening chest pain over the last 2 weeks.  I suspect that his chest pain is likely musculoskeletal nature as the patient states that she had pain when lifting the ultrasound probe on her her left chest and she was recently helping her daughter move.  However, patient did state that her pain did improve with nitroglycerin which would be atypical for musculoskeletal pain. -2D echo showed EF of 40 to 45% with some mildly impaired relaxation -Continue with aspirin for now as well as nitroglycerin as needed -Patient did have a mildly elevated troponin of 34 which decreased to 32. -EKG with no acute ST/T wave changes, old left bundle branch block -Chest x-ray with no acute pathology noted -We will obtain cardiac CT per cardiology recommendations -Continue with aspirin for now -We will start low-dose beta-blocker given mild cardiomyopathy (EF of 40 to 45%) -Likely DC home tomorrow after cardiac CT  Aldine Contes, MD 9/21/20202:52 PM

## 2019-09-09 ENCOUNTER — Telehealth: Payer: Self-pay | Admitting: Medical

## 2019-09-09 ENCOUNTER — Ambulatory Visit: Payer: Medicare Other | Admitting: Cardiology

## 2019-09-09 DIAGNOSIS — E785 Hyperlipidemia, unspecified: Secondary | ICD-10-CM | POA: Diagnosis not present

## 2019-09-09 DIAGNOSIS — Z881 Allergy status to other antibiotic agents status: Secondary | ICD-10-CM

## 2019-09-09 DIAGNOSIS — R079 Chest pain, unspecified: Secondary | ICD-10-CM | POA: Diagnosis not present

## 2019-09-09 DIAGNOSIS — I25111 Atherosclerotic heart disease of native coronary artery with angina pectoris with documented spasm: Secondary | ICD-10-CM | POA: Diagnosis not present

## 2019-09-09 DIAGNOSIS — Z885 Allergy status to narcotic agent status: Secondary | ICD-10-CM

## 2019-09-09 DIAGNOSIS — I447 Left bundle-branch block, unspecified: Secondary | ICD-10-CM | POA: Diagnosis not present

## 2019-09-09 DIAGNOSIS — Z888 Allergy status to other drugs, medicaments and biological substances status: Secondary | ICD-10-CM

## 2019-09-09 LAB — LIPID PANEL
Cholesterol: 194 mg/dL (ref 0–200)
HDL: 41 mg/dL (ref >40)
LDL Cholesterol: 135 mg/dL — ABNORMAL HIGH (ref 0–99)
Total CHOL/HDL Ratio: 4.7 RATIO
Triglycerides: 91 mg/dL (ref <150)
VLDL: 18 mg/dL (ref 0–40)

## 2019-09-09 LAB — BASIC METABOLIC PANEL
Anion gap: 9 (ref 5–15)
BUN: 17 mg/dL (ref 8–23)
CO2: 26 mmol/L (ref 22–32)
Calcium: 8.9 mg/dL (ref 8.9–10.3)
Chloride: 104 mmol/L (ref 98–111)
Creatinine, Ser: 0.66 mg/dL (ref 0.44–1.00)
GFR calc Af Amer: >60 mL/min (ref >60)
GFR calc non Af Amer: >60 mL/min (ref >60)
Glucose, Bld: 108 mg/dL — ABNORMAL HIGH (ref 70–99)
Potassium: 3.5 mmol/L (ref 3.5–5.1)
Sodium: 139 mmol/L (ref 135–145)

## 2019-09-09 LAB — CBC
HCT: 36.2 % (ref 36.0–46.0)
Hemoglobin: 12.2 g/dL (ref 12.0–15.0)
MCH: 30.5 pg (ref 26.0–34.0)
MCHC: 33.7 g/dL (ref 30.0–36.0)
MCV: 90.5 fL (ref 80.0–100.0)
Platelets: 235 10*3/uL (ref 150–400)
RBC: 4 MIL/uL (ref 3.87–5.11)
RDW: 12.9 % (ref 11.5–15.5)
WBC: 9.4 10*3/uL (ref 4.0–10.5)
nRBC: 0 % (ref 0.0–0.2)

## 2019-09-09 LAB — HEMOGLOBIN A1C
Hgb A1c MFr Bld: 5.9 % — ABNORMAL HIGH (ref 4.8–5.6)
Mean Plasma Glucose: 122.63 mg/dL

## 2019-09-09 MED ORDER — NITROGLYCERIN 0.4 MG SL SUBL: 0.4000 mg | SUBLINGUAL_TABLET | SUBLINGUAL | 4 refills | Status: DC | PRN

## 2019-09-09 MED ORDER — CARVEDILOL 12.5 MG PO TABS: 12.5000 mg | ORAL_TABLET | Freq: Two times a day (BID) | ORAL | Status: DC

## 2019-09-09 MED ORDER — ATORVASTATIN CALCIUM 40 MG PO TABS: 40.0000 mg | ORAL_TABLET | Freq: Every day | ORAL | Status: DC

## 2019-09-09 MED ORDER — ATORVASTATIN CALCIUM 40 MG PO TABS: 40.0000 mg | ORAL_TABLET | Freq: Every day | ORAL | 11 refills | Status: DC

## 2019-09-09 MED ORDER — CARVEDILOL 12.5 MG PO TABS: 12.5000 mg | ORAL_TABLET | Freq: Two times a day (BID) | ORAL | 11 refills | Status: DC

## 2019-09-09 MED FILL — ATORVASTATIN CALCIUM 40 MG: 40 | 30 days supply | Qty: 30 | Fill #0

## 2019-09-09 MED FILL — CARVEDILOL 12.5 MG TABLET: 12.5 | 30 days supply | Qty: 60 | Fill #0

## 2019-09-09 NOTE — Progress Notes (Signed)
Internal Medicine Attending:   I saw and examined the patient. I reviewed the resident's note and I agree with the resident's findings and plan as documented in the resident's note.  Patient feels well today with no new complaints.  She states that her chest pain and shortness of breath have resolved.  Patient is initially made in the hospital with chest pain initially thought to be secondary to coronary artery disease.  Cardiology follow-up recommendation appreciated.  Patient is status post coronary artery CT which is significant for mild nonobstructive CAD.  Given patient's cardiomyopathy noted on 2D echo (EF of 40 to 45% with impaired relaxation) we will start patient on Coreg 3.125 mg twice daily.  Patient also noted to have an elevated LDL and will need to be started on high intensity statin (atorvastatin 40 mg daily).  No further work-up at this time.  Patient stable for DC home today.

## 2019-09-09 NOTE — Progress Notes (Addendum)
Progress Note  Patient Name: [name] Date of Encounter: 09/09/2019  Primary Cardiologist: Buford Dresser, MD   Subjective   Patient is doing well this morning. Denies chest pain or sob. Possible discharge today.   Inpatient Medications    Scheduled Meds: . aspirin  162 mg Oral Daily  . enoxaparin (LOVENOX) injection  40 mg Subcutaneous Q24H  . ketorolac  15 mg Intravenous Once  . rOPINIRole  1 mg Oral QHS  . sertraline  50 mg Oral Daily  . sodium chloride flush  3 mL Intravenous Once   Continuous Infusions:  PRN Meds: acetaminophen **OR** acetaminophen, diazepam, HYDROcodone-acetaminophen, meclizine   Vital Signs    Vitals:   09/08/19 0511 09/08/19 1552 09/08/19 2059 09/09/19 0610  BP: (!) 144/77 (!) 141/79 137/65 130/61  Pulse: 73 66 78 71  Resp:  19 20 20   Temp: 98 F (36.7 C) 98.3 F (36.8 C) 97.7 F (36.5 C) 98.4 F (36.9 C)  TempSrc: Oral Oral Oral Oral  SpO2: 96% 93% 94% 96%  Weight: 75.4 kg   75.5 kg  Height: 4\' 10"  (1.473 m)       Intake/Output Summary (Last 24 hours) at 09/09/2019 0939 Last data filed at 09/08/2019 2103 Gross per 24 hour  Intake 240 ml  Output -  Net 240 ml   Last 3 Weights 09/09/2019 09/08/2019 12/14/2018  Weight (lbs) 166 lb 7.2 oz 166 lb 4.8 oz 169 lb  Weight (kg) 75.5 kg 75.433 kg 76.658 kg      Telemetry    NSR, HR 60-70s, no other arrhythmias noted - Personally Reviewed  ECG    No new - Personally Reviewed  Physical Exam   GEN: No acute distress.   Neck: No JVD Cardiac: RRR, no murmurs, rubs, or gallops.  Respiratory: Clear to auscultation bilaterally. GI: Soft, nontender, non-distended  MS: Mild B/L pedal edema; No deformity. Neuro:  Nonfocal  Psych: Normal affect   Labs    High Sensitivity Troponin:   Recent Labs  Lab 09/07/19 1123 09/07/19 1318  TROPONINIHS 34* 32*      Chemistry Recent Labs  Lab 09/07/19 1123 09/08/19 0509 09/09/19 0329  NA 141 140 139  K 3.9 3.6 3.5  CL 106 104  104  CO2 25 26 26   GLUCOSE 116* 104* 108*  BUN 12 15 17   CREATININE 0.56 0.58 0.66  CALCIUM 9.6 9.0 8.9  PROT  --  6.4*  --   ALBUMIN  --  3.7  --   AST  --  19  --   ALT  --  21  --   ALKPHOS  --  67  --   BILITOT  --  0.6  --   GFRNONAA >60 >60 >60  GFRAA >60 >60 >60  ANIONGAP 10 10 9      Hematology Recent Labs  Lab 09/07/19 1123 09/08/19 0509 09/09/19 0329  WBC 9.0 9.0 9.4  RBC 4.35 4.19 4.00  HGB 12.8 12.5 12.2  HCT 39.7 37.9 36.2  MCV 91.3 90.5 90.5  MCH 29.4 29.8 30.5  MCHC 32.2 33.0 33.7  RDW 13.1 13.1 12.9  PLT 276 259 235    BNPNo results for input(s): BNP, PROBNP in the last 168 hours.   DDimer No results for input(s): DDIMER in the last 168 hours.   Radiology    Ct Coronary Morph W/cta Cor W/score W/ca W/cm &/or Wo/cm  Addendum Date: 09/09/2019   ADDENDUM REPORT: 09/09/2019 08:40 EXAM: OVER-READ INTERPRETATION  CT  CHEST The following report is an over-read performed by radiologist Dr. Alvino Blood [facility] on 09/09/2019. This over-read does not include interpretation of cardiac or coronary anatomy or pathology. The coronary CTA interpretation by the cardiologist is attached. COMPARISON:  None. FINDINGS: Limited view of the lung parenchyma demonstrates no suspicious nodularity. Airways are normal. Limited view of the mediastinum demonstrates no adenopathy. Esophagus normal. Limited view of the upper abdomen unremarkable. Limited view of the skeleton and chest wall is unremarkable. IMPRESSION: No significant extracardiac findings. Electronically Signed   By: Suzy Bouchard M.D.   On: 09/09/2019 08:40   Result Date: 09/09/2019 CLINICAL DATA:  78 year old female with chest pain. EXAM: Cardiac/Coronary  CTA TECHNIQUE: The patient was scanned on a [employer]. FINDINGS: A 100 kV prospective scan was triggered in the descending thoracic aorta at 111 HU's. Axial non-contrast 3 mm slices were carried out through the heart. The data set  was analyzed on a dedicated work station and scored using the [place]. Gantry rotation speed was 250 msecs and collimation was .6 mm. No beta blockade and 0.8 mg of sl NTG was given. The 3D data set was reconstructed in 5% intervals of the 67-82 % of the R-R cycle. Diastolic phases were analyzed on a dedicated work station using MPR, MIP and VRT modes. The patient received 80 cc of contrast. Aorta:  Normal size.  Mild diffuse calcifications.  No dissection. Aortic Valve:  Trileaflet.  Mild calcifications. Coronary Arteries:  Normal coronary origin.  Right dominance. RCA is a large dominant artery that gives rise to PDA and PLA. There are only minimal irregularities. Left main is a large artery that gives rise to LAD and LCX arteries. LAD is a large vessel that has mild calcified plaque in the proximal and mid portion with stenosis 25-49%. LCX is a large non-dominant artery that gives rise to one large OM1 branch. There are only minimal irregularities. OM1 is a very large branch that has no plaque. Other findings: Normal pulmonary vein drainage into the left atrium. Normal left atrial appendage without a thrombus. IMPRESSION: 1. Coronary calcium score of 208. This was 61 percentile for age and sex matched control. 2. Normal coronary origin with right dominance. 3. CAD-RADS 2. Mild non-obstructive CAD (25-49%). Consider non-atherosclerotic causes of chest pain. Consider preventive therapy and risk factor modification. 4. Mildly dilated pulmonary artery measuring 32 mm. Electronically Signed: By: Ena Dawley On: 09/08/2019 17:35   Dg Chest Portable 1 View  Result Date: 09/07/2019 CLINICAL DATA:  Chest pain and dizziness onset this morning. History of asthma. EXAM: PORTABLE CHEST 1 VIEW COMPARISON:  Chest x-ray dated 09/23/2018. FINDINGS: Borderline cardiomegaly, stable. Lungs are clear. No pleural effusion or pneumothorax seen. Osseous structures about the chest are unremarkable. IMPRESSION: 1. No  active disease. No evidence of pneumonia or pulmonary edema. 2. Stable borderline cardiomegaly. Electronically Signed   By: Franki Cabot M.D.   On: 09/07/2019 13:36    Cardiac Studies   Cardiac CT 09/08/19 IMPRESSION: 1. Coronary calcium score of 208. This was 81 percentile for age and sex matched control. 2. Normal coronary origin with right dominance. 3. CAD-RADS 2. Mild non-obstructive CAD (25-49%). Consider non-atherosclerotic causes of chest pain. Consider preventive therapy and risk factor modification. 4. Mildly dilated pulmonary artery measuring 32 mm.   Echo 09/14/19 1. Left ventricular ejection fraction, by visual estimation, is 40 to 45%. The left ventricle has normal function. Normal left ventricular size. There is no left ventricular hypertrophy.  2. Left ventricular diastolic Doppler parameters are consistent with impaired relaxation pattern of LV diastolic filling.  3. Global right ventricle has normal systolic function.The right ventricular size is normal. No increase in right ventricular wall thickness.  4. Left atrial size was normal.  5. Right atrial size was normal.  6. The mitral valve is normal in structure. Mild mitral valve regurgitation. No evidence of mitral stenosis.  7. The tricuspid valve is normal in structure. Tricuspid valve regurgitation is trivial.  8. The aortic valve is normal in structure. Aortic valve regurgitation was not visualized by color flow Doppler. Structurally normal aortic valve, with no evidence of sclerosis or stenosis.  9. The pulmonic valve was normal in structure. Pulmonic valve regurgitation is not visualized by color flow Doppler. 10. Normal pulmonary artery systolic pressure. 11. The inferior vena cava is normal in size with greater than 50% respiratory variability, suggesting right atrial pressure of 3 mmHg.  Patient Profile     78 y.o. female with a hx of Prinzmetal's angina, asthma, chronic lymphedema, chronic back pain,  anxiety, and sleep apnea on CPAP at night who is being seen for the evaluation of chest pain.   Assessment & Plan    Chest pain/Prenzmetal's Angina - Patient has long history of Prinzmetal's angina dating back to 50s. Remote h/o of cardiac caths with no stents and normal stress test in 2012. Presented with atypical chest pain 9/20. HS Troponin 34 > 32. EKG did not show ST changes, old LBBB - Echo showed EF 40-45% with impaired relaxation - Cardiac CT showed mild nonobstructive CAD - Patient denies chest pain - Possible discharge today - LDL 135 >> will start patient on a statin - A1C 5.9 - Will change aspirin from 162 to 81 mg daily - Will start BB  - Discharge patient on SL NTG  - Will arrange f/u for patient  Cardiomyopathy - EF 40-45% with impaired relaxation - Unsure chronicity of this - No signs of acute heart failure - Will start on Coreg 3.125 mg BID  Hyperlipidemia - LD 135 - Will start on Atorvastatin 40 mg daily - F/u labs in 4-6 weeks  Chronic lymphedema - B/L mild ankle edema >> at baseline per patient For questions or updates, please contact [place] Please consult [email or web address] for contact info under        Signed, Cadence H Furth, PA-C  09/09/2019, 9:39 AM    Mildly decreased LVEF by echo.  Not related to CAD.  Will start Coreg 3.125 BID.    Atorvastatin for hyperlipidemia.   Jettie Booze, MD

## 2019-09-09 NOTE — Telephone Encounter (Signed)
Patient was discharged earlier today from the hospital. Coreg 12.5 mg was sent to [place] and the patient left with the medication. After review this dose needed to be changed. I called the patient and let her know to take 1/2 tablet of Coreg daily to be 6.25 mg daily. Patient voiced her understanding.   Abayomi Pattison Kathlen Mody, PA-C

## 2019-09-09 NOTE — Discharge Summary (Signed)
Name: Kristin Roach MRN: [id number] DOB: 1941-06-04 78 y.o. PCP: Jettie Booze, NP  Date of Admission: 09/07/2019 11:15 AM Date of Discharge:  Attending Physician: Aldine Contes, MD  Discharge Diagnosis: 1. Angina  Discharge Medications: Allergies as of 09/09/2019      Reactions   Ergonovine Other (See Comments)   Used in heart study Reaction: Throat closed up   Demerol [meperidine] Itching   Erythromycin Nausea And Vomiting   Flagyl [metronidazole] Hives   Large amounts   Percocet [oxycodone-acetaminophen] Other (See Comments)   nightmares      Medication List    TAKE these medications   acetaminophen 650 MG CR tablet Commonly known as: TYLENOL Take 650 mg by mouth every 8 (eight) hours as needed for pain.   albuterol 108 (90 Base) MCG/ACT inhaler Commonly known as: VENTOLIN HFA Inhale 2 puffs into the lungs every 6 (six) hours as needed for wheezing.   aspirin 81 MG tablet Take 162 mg by mouth as needed for pain.   atorvastatin 40 MG tablet Commonly known as: LIPITOR Take 1 tablet (40 mg total) by mouth daily at 6 PM.   budesonide-formoterol 80-4.5 MCG/ACT inhaler Commonly known as: SYMBICORT Inhale 2 puffs into the lungs as needed.   carvedilol 12.5 MG tablet Commonly known as: COREG Take 1 tablet (12.5 mg total) by mouth 2 (two) times daily with a meal.   diazepam 5 MG tablet Commonly known as: VALIUM Take 5 mg by mouth as needed for anxiety (when she takes nitroglycerin).   diclofenac sodium 1 % Gel Commonly known as: VOLTAREN Apply 1 application topically 4 (four) times daily as needed for pain.   HYDROcodone-acetaminophen 7.5-325 MG tablet Commonly known as: NORCO Take 1 tablet by mouth 4 (four) times daily as needed for moderate pain. What changed: Another medication with the same name was removed. Continue taking this medication, and follow the directions you see here.   meclizine 25 MG tablet Commonly known as: ANTIVERT Take 25 mg by  mouth as needed.   meloxicam 7.5 MG tablet Commonly known as: MOBIC Take 7.5 mg by mouth daily.   mupirocin ointment 2 % Commonly known as: BACTROBAN Apply 1 application topically 3 (three) times daily.   nitroGLYCERIN 0.4 MG SL tablet Commonly known as: NITROSTAT Place 1 tablet (0.4 mg total) under the tongue every 5 (five) minutes as needed for chest pain.   rOPINIRole 1 MG tablet Commonly known as: REQUIP Take 1 mg by mouth at bedtime.   sertraline 50 MG tablet Commonly known as: ZOLOFT Take 50 mg by mouth daily.   tizanidine 2 MG capsule Commonly known as: Zanaflex Take 1 capsule (2 mg total) by mouth 3 (three) times daily.      Disposition and follow-up:   Kristin Roach was discharged from [facility] in Stable condition.  At the hospital follow up visit please address:  1. Angina: Patient presented with 2-week onset of increased chest pain that was responsive to nitroglycerin.  EKG showed old left bundle branch block but no acute ischemic changes.  Troponins were slightly elevated to 34 and 32.  Echo EF 40 to 45% CT coronary significant for nonobstructive CAD (25-49%).  - Patient was discharged with Coreg 12.5 mg that was sent to the [facility] and the patient left.  Cardiology called the patient to let her know to take Coreg 6.25 mg daily and patient voiced understanding. - Follow-up appointment schedueled with cardiology on 10/08 with Dr.  Bridgette.  2.  Labs / imaging needed at time of follow-up: cbc, bmp, EKG if having chest pain  3.  Pending labs/ test needing follow-up: none  Follow-up Appointments:   Hospital Course by problem list:  1. Angina: In summary, Kristin Roach is a 78 year old female with a past medical history significant for Prinzmetal angina, anxiety, asthma, sleep apnea, and chronic back pain who presented to the ED with left chest wall pain that radiated to the flank over the past 2 weeks. The pain was responsive to  nitroglycerin, which she took about 4 times prior to coming to the hospital.  Initial troponins were slightly elevated to 34 and 32 and EKG was remarkable for an old left bundle branch block but negative for an acute ischemic process.  Echocardiogram was notable for EF of 40 to 45% and impaired left ventricular diastolic filling.  Cardiology was consulted and obtained a CT coronary, which was significant for mild nonobstructive CAD (25-49%). Cardiology will follow up with the patient in 4 to 6 weeks in the outpatient setting. - Of note, the patient was discharged with Coreg 12.5 mg that was sent to the [facility] and the patient left.  Cardiology called the patient to let her know to take Coreg 6.25 mg daily and patient voiced understanding. -Follow-up appointment schedueled with cardiology on 10/08 with Dr. Shawna Roach.  Discharge Vitals:   BP 130/61   Pulse 71   Temp 98.4 F (36.9 C) (Oral)   Resp 20   Ht 4\' 10"  (1.473 m)   Wt 75.5 kg   SpO2 96%   BMI 34.79 kg/m   Pertinent Labs, Studies, and Procedures:  CBC Latest Ref Rng & Units 09/09/2019 09/08/2019 09/07/2019  WBC 4.0 - 10.5 K/uL 9.4 9.0 9.0  Hemoglobin 12.0 - 15.0 g/dL 12.2 12.5 12.8  Hematocrit 36.0 - 46.0 % 36.2 37.9 39.7  Platelets 150 - 400 K/uL 235 259 276   BMP Latest Ref Rng & Units 09/09/2019 09/08/2019 09/07/2019  Glucose 70 - 99 mg/dL 108(H) 104(H) 116(H)  BUN 8 - 23 mg/dL 17 15 12   Creatinine 0.44 - 1.00 mg/dL 0.66 0.58 0.56  Sodium 135 - 145 mmol/L 139 140 141  Potassium 3.5 - 5.1 mmol/L 3.5 3.6 3.9  Chloride 98 - 111 mmol/L 104 104 106  CO2 22 - 32 mmol/L 26 26 25   Calcium 8.9 - 10.3 mg/dL 8.9 9.0 9.6   CT Coronary: IMPRESSION: 1. Coronary calcium score of 208. This was 65 percentile for age and sex matched control.  2. Normal coronary origin with right dominance.  3. CAD-RADS 2. Mild non-obstructive CAD (25-49%). Consider non-atherosclerotic causes of chest pain. Consider preventive therapy and risk factor  modification.  4. Mildly dilated pulmonary artery measuring 32 mm.   Discharge Instructions: Discharge Instructions    Call MD for:  difficulty breathing, headache or visual disturbances   Complete by: As directed    Call MD for:  persistant dizziness or light-headedness   Complete by: As directed    Diet - low sodium heart healthy   Complete by: As directed    Discharge instructions   Complete by: As directed    Please continue to take your home medications as previously prescribed. We have refilled you nitroglycerin as requested by the cardiologist.  We strongly recommend that you follow up with you outpatient primary care doctor to discuss any further necessary changes or therapy.   Increase activity slowly   Complete by: As directed  Signed: [name], MD Internal Medicine, PGY1 Pager: [phone]  09/10/2019,7:30 AM

## 2019-09-09 NOTE — Progress Notes (Signed)
   Subjective: Patient seen at the bedside this morning on rounds.  The patient says she does not have any chest pain or shortness of breath at this time.  We informed her that the CT coronaries showed mild nonobstructive coronary artery disease and was stable for discharge.  Patient is in agreement and would like to go home today.  Objective:  Vital signs in last 24 hours: Vitals:   09/08/19 0511 09/08/19 1552 09/08/19 2059 09/09/19 0610  BP: (!) 144/77 (!) 141/79 137/65 130/61  Pulse: 73 66 78 71  Resp:  19 20 20   Temp: 98 F (36.7 C) 98.3 F (36.8 C) 97.7 F (36.5 C) 98.4 F (36.9 C)  TempSrc: Oral Oral Oral Oral  SpO2: 96% 93% 94% 96%  Weight: 75.4 kg   75.5 kg  Height: 4\' 10"  (1.473 m)      Physical exam: General: Laying in bed comfortably, NAD HEENT: NCAT CV: RRR, normal S1 and S2, no murmurs rubs or gallops appreciated PULM: Clear to auscultation bilaterally NEURO: Alert and oriented, no focal deficits  Assessment/Plan:  Active Problems:   Chest pain  In summary, Kristin Roach is a 78 year old female with a past medical history significant for Prinzmetal angina, anxiety, asthma, sleep apnea, and chronic back pain who presented to the ED with left chest wall pain that radiated to the flank over the past 2 weeks and was responsive to nitroglycerin, concerning for ischemic heart disease. The patients intitial troponins were slightly elevated to 34 and 32 and EKG was remarkable for old left bundle branch block but was negative for an ischemic process. CXR unremarkable, low suspicion for pulmonary process.    #Fatigue #SOB #Hx Prinzmetal angina  #Chest Pain: Work-up so far does not appear to be significant for ischemic cardiac event or pulmonary process, given a negative CXR, no ST changes on EKG, and slightly elevated troponins. Echocardiogram notable for EF of 40 to 45% and impaired left ventricular diastolic filling. CT coronary significant for mild nonobstructive CAD (25-49%  stenosis).  We appreciate cardiology's recommendations. - Follow-up with cardiology in 4 to 6 weeks in the outpatient setting. - Patient encouraged to follow-up with her primary care provider within 1 to 2 weeks of being discharged from the hospital. - Start Coreg 6.25 mg twice daily  - Aspirin 81 mg daily  #HLD: LDL elevated to 135 on lipid panel - Start atorvastatin 40 mg daily   #Hx of anxiety: Takes Zoloft 50 mg daily and Valium  5 mg q6hr PRN at home. Also takes ropinirole nightly for restless legs.  - Continue home Zoloft 50 mg - Continue home Valium as needed - Continue home ropinirole 1 mg nightly  #Shoulder pain  #Chronic back pain: From lower back laminectomy and torn rotator cuff - Norco 7.5-325 mg every 4 hours as needed - Tylenol 650 mg q8hrs PRN  #DVT prophylaxis - Lovenox 40 mg subq injections daily  #Code status: FULL  #Dispo: Discharge home today.  Kristin Plater, MD Internal Medicine, PGY1 Pager: [phone]  09/09/2019,2:47 PM

## 2019-09-11 ENCOUNTER — Telehealth: Payer: Self-pay | Admitting: Cardiology

## 2019-09-11 NOTE — Telephone Encounter (Signed)
Informed patient to contact pcp - this medication is not  Prescribed  By cardiologist

## 2019-09-11 NOTE — Telephone Encounter (Signed)
  Patient was recently in the hospital where they gave her Tizanidine and her paperwork shows she is supposed to be taking it at home. She states that she was not given a prescription for this medication and wants to know if she should have one.

## 2019-09-16 ENCOUNTER — Other Ambulatory Visit: Payer: Self-pay

## 2019-09-16 ENCOUNTER — Telehealth: Payer: Self-pay | Admitting: Cardiology

## 2019-09-16 NOTE — Telephone Encounter (Signed)
°  ° °  [facility] Pre-operative Risk Assessment    Request for surgical clearance:  1. What type of surgery is being performed? Cataract removal of the R eye  2. When is this surgery scheduled? 09-25-19   3. What type of clearance is required (medical clearance vs. Pharmacy clearance to hold med vs. Both)? both  4. Are there any medications that need to be held prior to surgery and how long? Patient is not sure  5. Practice name and name of physician performing surgery? Dr. Clent Jacks, [place]  6. What is your office phone number: [phone]   7.   What is your office fax number:   8.   Anesthesia type (None, local, MAC, general) ? Patient is not sure  Patient wanted to call and make sure it was safe for her to have this procedure. She was recently in the hospital, where she found out that her heart is only working at 40%. Please let her know if it is safe to have the procedure or not, and she will cancel if she needs to    Johnna Acosta 09/16/2019, 1:42 PM  _________________________________________________________________   (provider comments below)

## 2019-09-16 NOTE — Telephone Encounter (Signed)
   Primary Cardiologist: Buford Dresser, MD  Chart reviewed as part of pre-operative protocol coverage. Cataract extractions are recognized in guidelines as low risk surgeries that do not typically require specific preoperative testing or holding of blood thinner therapy. Therefore, given past medical history and time since last visit, based on ACC/AHA guidelines, Arnetia A Schave would be at acceptable risk for the planned procedure without further cardiovascular testing.   I will route this recommendation to the requesting party via Epic fax function and remove from pre-op pool.  Please call with questions.  Tami Lin Annesha Delgreco, PA 09/16/2019, 3:45 PM

## 2019-09-16 NOTE — Telephone Encounter (Signed)
Informed pt of clearance and that it has been faxed to Dr. Patrici Ranks' office. Pt verbalized thanks for the call.

## 2019-09-25 ENCOUNTER — Other Ambulatory Visit: Payer: Self-pay | Admitting: Family Medicine

## 2019-09-25 ENCOUNTER — Encounter: Payer: Self-pay | Admitting: Cardiology

## 2019-09-25 ENCOUNTER — Ambulatory Visit (INDEPENDENT_AMBULATORY_CARE_PROVIDER_SITE_OTHER): Payer: Medicare Other | Admitting: Cardiology

## 2019-09-25 ENCOUNTER — Other Ambulatory Visit: Payer: Self-pay

## 2019-09-25 VITALS — BP 121/65 | HR 75 | Temp 97.1°F | Ht 60.0 in | Wt 169.0 lb

## 2019-09-25 DIAGNOSIS — I2510 Atherosclerotic heart disease of native coronary artery without angina pectoris: Secondary | ICD-10-CM | POA: Diagnosis not present

## 2019-09-25 DIAGNOSIS — I5189 Other ill-defined heart diseases: Secondary | ICD-10-CM

## 2019-09-25 DIAGNOSIS — R600 Localized edema: Secondary | ICD-10-CM

## 2019-09-25 DIAGNOSIS — Z7189 Other specified counseling: Secondary | ICD-10-CM | POA: Diagnosis not present

## 2019-09-25 DIAGNOSIS — N644 Mastodynia: Secondary | ICD-10-CM

## 2019-09-25 MED ORDER — CARVEDILOL 12.5 MG PO TABS: 6.2500 mg | ORAL_TABLET | Freq: Two times a day (BID) | ORAL | 11 refills | Status: DC

## 2019-09-25 NOTE — Progress Notes (Signed)
Cardiology Office Note:    Date:  09/25/2019   ID:  Kristin Roach, DOB January 07, 1941, MRN [id number]  PCP:  Jettie Booze, NP  Cardiologist:  Buford Dresser, MD PhD  Referring MD: Jettie Booze, NP   CC: post hospitalization follow up  History of Present Illness:    Kristin Roach is a 78 y.o. female with a hx of OSA, asthma, arthritis, Prinzmetal's angina. I initially met her 07/30/18 as a new consult.  Cardiac history: Saw Dr. Bayard Hugger in [place], [place] (Fax [phone]). Notes states: Heart murmur, mitral valve prolapse; sleep apnea, uses CPAP. Dr. Joaquim Lai at [facility] was her internal medicine physician. Thinks her last stress test was around 2012. Echo in 2017. Had prior heart caths in the early 1990s, did not require stents but does report that she had vasconstriction with ergotamine.  Today:  Admitted 09/07/19 for chest pain. Seen by Dr. Irish Lack in the hospital. Had CT coronary with nonobstructive disease.  She notes that she felt groggy after discharge, has since stopped tizanidine and feeling much better.   Brings BP log with her today. Range is 94/50-157/75, most 120s/60s.   No chest pain since discharge. Breathing is good. Doesn't always remember CPAP but uses it when she does remember. Swelling in legs stable (has chronic lymphedema), does lymphedema pump for 2 hours/day.   Discussed NSAIDs and CAD, that we prefer tylenol. She will try to use this preferentially and minimize NSAIDs.  Discussed ACEi/ARB, no BP room today. Confirmed that she was taking carvedilol 6.25 mg BID, not 12.5 mg BID, adjusted medication list today.  Walking daily, no shortness of breath. Limited by foot pain, doesn't go too far but better than before.  Denies chest pain, shortness of breath at rest or with normal exertion. No PND, orthopnea, or unexpected weight gain. No syncope or palpitations.  Having right cataract removed on Monday, no issues from a cardiac perspective with this.    Past Medical History:  Diagnosis Date  . Angina at rest [facility])   . Arthritis   . Asthma    bronchial asthma  . Cataracts, bilateral   . Complication of anesthesia    woke up during back procedure  . HOH (hard of hearing)   . Pneumonia    hx of 2012  . PONV (postoperative nausea and vomiting)    with knee surgery in 11 2015  . RLS (restless legs syndrome)   . Sleep apnea    cpap    Past Surgical History:  Procedure Laterality Date  . ABDOMINAL HYSTERECTOMY    . BACK SURGERY     laminectomy  . BELPHAROPTOSIS REPAIR    . CARDIAC CATHETERIZATION    . CESAREAN SECTION    . CHOLECYSTECTOMY    . HERNIA REPAIR    . SHOULDER SURGERY     removal of bone spur  . TONSILLECTOMY    . TOTAL KNEE ARTHROPLASTY Left 10/26/2014   Procedure: LEFT TOTAL KNEE ARTHROPLASTY;  Surgeon: Kerin Salen, MD;  Location: [place];  Service: Orthopedics;  Laterality: Left;    Current Medications: Current Outpatient Medications on File Prior to Visit  Medication Sig  . acetaminophen (TYLENOL) 650 MG CR tablet Take 650 mg by mouth every 8 (eight) hours as needed for pain.  Marland Kitchen albuterol (VENTOLIN HFA) 108 (90 Base) MCG/ACT inhaler Inhale 2 puffs into the lungs every 6 (six) hours as needed for wheezing.  Marland Kitchen aspirin 81 MG tablet Take 162 mg by mouth  as needed for pain.   Marland Kitchen atorvastatin (LIPITOR) 40 MG tablet Take 1 tablet (40 mg total) by mouth daily at 6 PM.  . budesonide-formoterol (SYMBICORT) 80-4.5 MCG/ACT inhaler Inhale 2 puffs into the lungs as needed.   . carvedilol (COREG) 12.5 MG tablet Take 1 tablet (12.5 mg total) by mouth 2 (two) times daily with a meal.  . diazepam (VALIUM) 5 MG tablet Take 5 mg by mouth as needed for anxiety (when she takes nitroglycerin).   Marland Kitchen diclofenac sodium (VOLTAREN) 1 % GEL Apply 1 application topically 4 (four) times daily as needed for pain.  Marland Kitchen HYDROcodone-acetaminophen (NORCO) 7.5-325 MG tablet Take 1 tablet by mouth 4 (four) times daily as needed for moderate pain.  .  meclizine (ANTIVERT) 25 MG tablet Take 25 mg by mouth as needed.  . meloxicam (MOBIC) 7.5 MG tablet Take 7.5 mg by mouth daily.  . mupirocin ointment (BACTROBAN) 2 % Apply 1 application topically 3 (three) times daily.  . nitroGLYCERIN (NITROSTAT) 0.4 MG SL tablet Place 1 tablet (0.4 mg total) under the tongue every 5 (five) minutes as needed for chest pain.  Marland Kitchen rOPINIRole (REQUIP) 1 MG tablet Take 1 mg by mouth at bedtime.  . sertraline (ZOLOFT) 50 MG tablet Take 50 mg by mouth daily.   . tizanidine (ZANAFLEX) 2 MG capsule Take 1 capsule (2 mg total) by mouth 3 (three) times daily. (Patient not taking: Reported on 07/30/2018)   No current facility-administered medications on file prior to visit.      Allergies:   Ergonovine, Demerol [meperidine], Erythromycin, Flagyl [metronidazole], and Percocet [oxycodone-acetaminophen]   Social History   Tobacco Use  . Smoking status: Never Smoker  . Smokeless tobacco: Never Used  Substance Use Topics  . Alcohol use: Not Currently    Alcohol/week: 14.0 standard drinks    Types: 14 Glasses of wine per week  . Drug use: No   Family history: mother passed from MI in age 81, father died of leukemia at age 63. Two grandparents died from MI. Sister just died of lung cancer at age 58. No other siblings. Son and daughter both have terrible migraines. Daughter was a methamphetamine user, had been sober for 8 years but has some residual issues.   ROS:   Please see the history of present illness.  Additional pertinent ROS: Constitutional: Negative for chills, fever, night sweats, unintentional weight loss  HENT: Negative for ear pain and hearing loss.   Eyes: Negative for loss of vision and eye pain.  Respiratory: Negative for cough, sputum, wheezing.   Cardiovascular: See HPI. Gastrointestinal: Negative for abdominal pain, melena, and hematochezia.  Genitourinary: Negative for dysuria and hematuria.  Musculoskeletal: Negative for falls and myalgias.   Skin: Negative for itching and rash.  Neurological: Negative for focal weakness, focal sensory changes and loss of consciousness.  Endo/Heme/Allergies: Does not bruise/bleed easily.    EKGs/Labs/Other Studies Reviewed:    The following studies were reviewed today:  CT September 20, 2019 Aorta:  Normal size.  Mild diffuse calcifications.  No dissection.  Aortic Valve:  Trileaflet.  Mild calcifications.  Coronary Arteries:  Normal coronary origin.  Right dominance.  RCA is a large dominant artery that gives rise to PDA and PLA. There are only minimal irregularities.  Left main is a large artery that gives rise to LAD and LCX arteries.  LAD is a large vessel that has mild calcified plaque in the proximal and mid portion with stenosis 25-49%.  LCX is a large non-dominant artery that  gives rise to one large OM1 branch. There are only minimal irregularities.  OM1 is a very large branch that has no plaque.  Other findings:  Normal pulmonary vein drainage into the left atrium.  Normal left atrial appendage without a thrombus.  IMPRESSION: 1. Coronary calcium score of 208. This was 43 percentile for age and sex matched control.  2. Normal coronary origin with right dominance.  3. CAD-RADS 2. Mild non-obstructive CAD (25-49%). Consider non-atherosclerotic causes of chest pain. Consider preventive therapy and risk factor modification.  4. Mildly dilated pulmonary artery measuring 32 mm.  Echo 09/08/19  1. Left ventricular ejection fraction, by visual estimation, is 40 to 45%. The left ventricle has normal function. Normal left ventricular size. There is no left ventricular hypertrophy.  2. Left ventricular diastolic Doppler parameters are consistent with impaired relaxation pattern of LV diastolic filling.  3. Global right ventricle has normal systolic function.The right ventricular size is normal. No increase in right ventricular wall thickness.  4. Left atrial size was  normal.  5. Right atrial size was normal.  6. The mitral valve is normal in structure. Mild mitral valve regurgitation. No evidence of mitral stenosis.  7. The tricuspid valve is normal in structure. Tricuspid valve regurgitation is trivial.  8. The aortic valve is normal in structure. Aortic valve regurgitation was not visualized by color flow Doppler. Structurally normal aortic valve, with no evidence of sclerosis or stenosis.  9. The pulmonic valve was normal in structure. Pulmonic valve regurgitation is not visualized by color flow Doppler. 10. Normal pulmonary artery systolic pressure. 11. The inferior vena cava is normal in size with greater than 50% respiratory variability, suggesting right atrial pressure of 3 mmHg.   EKG:  EKG is ordered today.  The ekg ordered today demonstrates normal sinus rhythm, RSR pattern in V1 but normal QRS duration  Recent Labs: 09/08/2019: ALT 21; TSH 0.869 09/09/2019: BUN 17; Creatinine, Ser 0.66; Hemoglobin 12.2; Platelets 235; Potassium 3.5; Sodium 139  Recent Lipid Panel    Component Value Date/Time   CHOL 194 09/09/2019 0329   TRIG 91 09/09/2019 0329   HDL 41 09/09/2019 0329   CHOLHDL 4.7 09/09/2019 0329   VLDL 18 09/09/2019 0329   LDLCALC 135 (H) 09/09/2019 0329    Physical Exam:    VS:  BP 121/65   Pulse 75   Temp (!) 97.1 F (36.2 C)   Ht 5' (1.524 m)   Wt 169 lb (76.7 kg)   SpO2 94%   BMI 33.01 kg/m     Wt Readings from Last 3 Encounters:  09/25/19 169 lb (76.7 kg)  09/09/19 166 lb 7.2 oz (75.5 kg)  12/14/18 169 lb (76.7 kg)    GEN: Well nourished, well developed in no acute distress HEENT: Normal, moist mucous membranes NECK: No JVD CARDIAC: regular rhythm, normal S1 and S2, no murmurs, rubs, gallops.  VASCULAR: Radial and DP pulses 2+ bilaterally. No carotid bruits RESPIRATORY:  Clear to auscultation without rales, wheezing or rhonchi  ABDOMEN: Soft, non-tender, non-distended MUSCULOSKELETAL:  Ambulates independently  SKIN: Warm and dry, mild bilateral pedal edema NEUROLOGIC:  Alert and oriented x 3. No focal neuro deficits noted. PSYCHIATRIC:  Normal affect   ASSESSMENT:    1. Nonocclusive coronary atherosclerosis of native coronary artery   2. Systolic dysfunction without heart failure   3. Cardiac risk counseling   4. Counseling on health promotion and disease prevention   5. Bilateral lower extremity edema    PLAN:  Nonobstructive CAD, history of Prinzmetal's angina, recent admission for chest pain:  -CT coronary with nonobstructive CAD -on aspirin 81 mg, atorvastatin 40 mg, carvedilol 6.25 mg BID -discussed recommendations re: avoidance of NSAIDs in CAD. She will try to use acetaminophen instead -no further chest pain since discharge -has SL NG -counseled on red flag warning signs that need immediate medical attention -recheck lipids/LFTs at follow up  Systolic dysfunction without heart failure -EF 40-45% -nonobstructive CAD on CT coronary -tolerating carvedilol -asymptomatic -has intermittently low BP into the 00P systolic. Unlikely to tolerate ACEi/ARB/ARNI. Reassess BP at 3 mos.  Cardiovascular risk counseling and prevention: -recommend heart healthy/Mediterranean diet, with whole grains, fruits, vegetable, fish, lean meats, nuts, and olive oil. Limit salt. -recommend moderate walking, 3-5 times/week for 30-50 minutes each session. Aim for at least 150 minutes.week. Goal should be pace of 3 miles/hours, or walking 1.5 miles in 30 minutes -recommend avoidance of tobacco products. Avoid excess alcohol. -ASCVD risk score: The 10-year ASCVD risk score Mikey Bussing DC Brooke Bonito., et al., 2013) is: 19.1%   Values used to calculate the score:     Age: 42 years     Sex: Female     Is Non-Hispanic African American: No     Diabetic: No     Tobacco smoker: No     Systolic Blood Pressure: 496 mmHg     Is BP treated: No     HDL Cholesterol: 41 mg/dL     Total Cholesterol: 194 mg/dL   Lymphedema vs.  Chronic insufficiency:  -pulses intact -well managed with lymphedema pump  Plan for follow up: 3 mos  Medication Adjustments/Labs and Tests Ordered: Current medicines are reviewed at length with the patient today.  Concerns regarding medicines are outlined above.  No orders of the defined types were placed in this encounter.  Meds ordered this encounter  Medications  . carvedilol (COREG) 12.5 MG tablet    Sig: Take 0.5 tablets (6.25 mg total) by mouth 2 (two) times daily with a meal.    Dispense:  30 tablet    Refill:  11    Patient Instructions  Medication Instructions:  Your Physician recommend you continue on your current medication as directed.    If you need a refill on your cardiac medications before your next appointment, please call your pharmacy.   Lab work: None  Testing/Procedures: None  Follow-Up: At [facility], you and your health needs are our priority.  As part of our continuing mission to provide you with exceptional heart care, we have created designated Provider Care Teams.  These Care Teams include your primary Cardiologist (physician) and Advanced Practice Providers (APPs -  Physician Assistants and Nurse Practitioners) who all work together to provide you with the care you need, when you need it. You will need a follow up appointment in 3 months.  Please call our office 2 months in advance to schedule this appointment.  You may see Buford Dresser, MD or one of the following Advanced Practice Providers on your designated Care Team:   Rosaria Ferries, PA-C . Jory Sims, DNP, ANP      Signed, Buford Dresser, MD PhD 09/25/2019 5:56 PM    [place]

## 2019-09-25 NOTE — Patient Instructions (Signed)

## 2019-09-29 ENCOUNTER — Encounter: Payer: Self-pay | Admitting: Cardiology

## 2019-09-30 ENCOUNTER — Ambulatory Visit: Payer: Medicare Other | Admitting: Adult Health

## 2019-10-06 ENCOUNTER — Telehealth: Payer: Self-pay | Admitting: Cardiology

## 2019-10-06 NOTE — Telephone Encounter (Signed)
Pt calling stating that she had an allergic reaction when taking the Atorvastatin, so she stop taking and wanted to know if Dr. Harrell Gave would like for her to take something else. Pt would like a call back at [phone]. Please address

## 2019-10-06 NOTE — Telephone Encounter (Signed)
Pt state she believe she had a reaction to Atorvastatin. She report waking up every morning feeling like she had the flu. She state she stop taking the medication on Sat 10/17 and immediately felt better. She is inquiring if MD would like for her to try a different medication.    Will route to MD

## 2019-10-07 ENCOUNTER — Ambulatory Visit: Payer: Medicare Other | Admitting: Interventional Cardiology

## 2019-10-08 MED ORDER — ROSUVASTATIN CALCIUM 40 MG PO TABS: 40.0000 mg | ORAL_TABLET | Freq: Every day | ORAL | 3 refills | Status: DC

## 2019-10-08 NOTE — Telephone Encounter (Signed)
Patient is calling to check on status of her call.  

## 2019-10-08 NOTE — Telephone Encounter (Signed)
Fine to hold off on for now, though usually statin reactions do not improve rapidly. I would give her two weeks off, then try rosuvastatin 20 mg. It is processed differently than the atorvastatin. Thanks.

## 2019-10-08 NOTE — Telephone Encounter (Signed)
Spoke to pt about Dr. Judeth Cornfield advice. D/c previous  Rx and put in Rosuvastatin. Verbalized understanding.

## 2019-10-08 NOTE — Addendum Note (Signed)
Addended by: Cain Sieve on: 10/08/2019 04:39 PM   Modules accepted: Orders

## 2019-12-01 ENCOUNTER — Other Ambulatory Visit: Payer: Self-pay

## 2019-12-01 ENCOUNTER — Ambulatory Visit
Admission: RE | Admit: 2019-12-01 | Discharge: 2019-12-01 | Disposition: A | Discharge status: Home / Self Care | Payer: Medicare Other | Source: Ambulatory Visit | Attending: Family Medicine | Admitting: Family Medicine

## 2019-12-01 ENCOUNTER — Ambulatory Visit: Payer: Medicare Other

## 2019-12-01 DIAGNOSIS — N644 Mastodynia: Secondary | ICD-10-CM

## 2020-01-06 ENCOUNTER — Telehealth: Payer: Self-pay | Admitting: *Deleted

## 2020-01-06 NOTE — Telephone Encounter (Signed)
A message was left, re: her follow up visit. 

## 2020-01-27 ENCOUNTER — Other Ambulatory Visit: Payer: Self-pay

## 2020-01-27 ENCOUNTER — Ambulatory Visit (INDEPENDENT_AMBULATORY_CARE_PROVIDER_SITE_OTHER): Payer: Medicare Other | Admitting: Cardiology

## 2020-01-27 ENCOUNTER — Encounter: Payer: Self-pay | Admitting: Cardiology

## 2020-01-27 VITALS — BP 140/82 | HR 65 | Temp 97.9°F | Ht <= 58 in | Wt 166.0 lb

## 2020-01-27 DIAGNOSIS — I2510 Atherosclerotic heart disease of native coronary artery without angina pectoris: Secondary | ICD-10-CM | POA: Diagnosis not present

## 2020-01-27 DIAGNOSIS — Z7189 Other specified counseling: Secondary | ICD-10-CM | POA: Diagnosis not present

## 2020-01-27 DIAGNOSIS — I5189 Other ill-defined heart diseases: Secondary | ICD-10-CM | POA: Diagnosis not present

## 2020-01-27 MED ORDER — ROSUVASTATIN CALCIUM 40 MG PO TABS: 40.0000 mg | ORAL_TABLET | Freq: Every day | ORAL | 11 refills | Status: DC

## 2020-01-27 NOTE — Progress Notes (Signed)
Cardiology Office Note:    Date:  01/27/2020   ID:  Kristin Roach, DOB 1941/04/01, MRN [id number]  PCP:  Kristin Booze, NP  Cardiologist:  Kristin Dresser, MD PhD  Referring MD: Kristin Booze, NP   CC: follow up  History of Present Illness:    Kristin Roach is a 79 y.o. female with a hx of OSA, asthma, arthritis, Prinzmetal's angina. I initially met her 07/30/18 as a new consult.  Cardiac history: Saw Dr. Bayard Roach in [place], [place] (Fax [phone]). Notes states: Heart murmur, mitral valve prolapse; sleep apnea, uses CPAP. Dr. Joaquim Roach at [facility] was her internal medicine physician. Thinks her last stress test was around 2012. Echo in 2017. Had prior heart caths in the early 1990s, did not require stents but does report that she had vasconstriction with ergotamine.  Admitted 09/07/19 for chest pain. Seen by Dr. Irish Roach in the hospital. Had CT coronary with nonobstructive disease.  Today: Very stressed, got lost, had the times confused, so rushed when she realized she was an hour late.  Had Covid over the holidays, left with some fatigue but otherwise feels at baseline. Felt that her breathing is good overall.   Rare chest twinges but no significant chest pain limiting her.   Denies shortness of breath at rest or with normal exertion. No PND, orthopnea, LE edema or unexpected weight gain. No syncope or palpitations.  Past Medical History:  Diagnosis Date  . Angina at rest [facility])   . Arthritis   . Asthma    bronchial asthma  . Cataracts, bilateral   . Complication of anesthesia    woke up during back procedure  . HOH (hard of hearing)   . Pneumonia    hx of 2012  . PONV (postoperative nausea and vomiting)    with knee surgery in 11 2015  . RLS (restless legs syndrome)   . Sleep apnea    cpap    Past Surgical History:  Procedure Laterality Date  . ABDOMINAL HYSTERECTOMY    . BACK SURGERY     laminectomy  . BELPHAROPTOSIS REPAIR    . CARDIAC  CATHETERIZATION    . CESAREAN SECTION    . CHOLECYSTECTOMY    . HERNIA REPAIR    . SHOULDER SURGERY     removal of bone spur  . TONSILLECTOMY    . TOTAL KNEE ARTHROPLASTY Left 10/26/2014   Procedure: LEFT TOTAL KNEE ARTHROPLASTY;  Surgeon: Kristin Salen, MD;  Location: [place];  Service: Orthopedics;  Laterality: Left;    Current Medications: Current Outpatient Medications on File Prior to Visit  Medication Sig  . acetaminophen (TYLENOL) 650 MG CR tablet Take 650 mg by mouth every 8 (eight) hours as needed for pain.  Marland Kitchen albuterol (VENTOLIN HFA) 108 (90 Base) MCG/ACT inhaler Inhale 2 puffs into the lungs every 6 (six) hours as needed for wheezing.  Marland Kitchen aspirin 81 MG tablet Take 81 mg by mouth daily.  . budesonide-formoterol (SYMBICORT) 80-4.5 MCG/ACT inhaler Inhale 2 puffs into the lungs as needed.   . carvedilol (COREG) 12.5 MG tablet Take 0.5 tablets (6.25 mg total) by mouth 2 (two) times daily with a meal.  . diazepam (VALIUM) 5 MG tablet Take 5 mg by mouth as needed for anxiety (when she takes nitroglycerin).   Marland Kitchen diclofenac sodium (VOLTAREN) 1 % GEL Apply 1 application topically 4 (four) times daily as needed for pain.  Marland Kitchen diclofenac Sodium (VOLTAREN) 1 % GEL Place onto the skin.  Marland Kitchen  HYDROcodone-acetaminophen (NORCO) 10-325 MG tablet Take 1 tablet by mouth every 4 (four) hours as needed.  . meclizine (ANTIVERT) 25 MG tablet Take 25 mg by mouth as needed.  . nitroGLYCERIN (NITROSTAT) 0.4 MG SL tablet Place 1 tablet (0.4 mg total) under the tongue every 5 (five) minutes as needed for chest pain.  Marland Kitchen rOPINIRole (REQUIP) 1 MG tablet Take by mouth.  . sertraline (ZOLOFT) 50 MG tablet Take by mouth.  . rosuvastatin (CRESTOR) 40 MG tablet Take 1 tablet (40 mg total) by mouth daily.   No current facility-administered medications on file prior to visit.     Allergies:   Ergonovine, Demerol [meperidine], Erythromycin, Flagyl [metronidazole], and Percocet [oxycodone-acetaminophen]   Social History     Tobacco Use  . Smoking status: Never Smoker  . Smokeless tobacco: Never Used  Substance Use Topics  . Alcohol use: Not Currently    Alcohol/week: 14.0 standard drinks    Types: 14 Glasses of wine per week  . Drug use: No   Family history: mother passed from MI in age 38, father died of leukemia at age 46. Two grandparents died from MI. Sister just died of lung cancer at age 61. No other siblings. Son and daughter both have terrible migraines. Daughter was a methamphetamine user, had been sober for 8 years but has some residual issues.   ROS:   Please see the history of present illness.  Additional pertinent ROS otherwise unremarkable.    EKGs/Labs/Other Studies Reviewed:    The following studies were reviewed today:  CT 10/07/2019 Aorta:  Normal size.  Mild diffuse calcifications.  No dissection.  Aortic Valve:  Trileaflet.  Mild calcifications.  Coronary Arteries:  Normal coronary origin.  Right dominance.  RCA is a large dominant artery that gives rise to PDA and PLA. There are only minimal irregularities.  Left main is a large artery that gives rise to LAD and LCX arteries.  LAD is a large vessel that has mild calcified plaque in the proximal and mid portion with stenosis 25-49%.  LCX is a large non-dominant artery that gives rise to one large OM1 branch. There are only minimal irregularities.  OM1 is a very large branch that has no plaque.  Other findings:  Normal pulmonary vein drainage into the left atrium.  Normal left atrial appendage without a thrombus.  IMPRESSION: 1. Coronary calcium score of 208. This was 36 percentile for age and sex matched control.  2. Normal coronary origin with right dominance.  3. CAD-RADS 2. Mild non-obstructive CAD (25-49%). Consider non-atherosclerotic causes of chest pain. Consider preventive therapy and risk factor modification.  4. Mildly dilated pulmonary artery measuring 32 mm.  Echo 09/08/19  1. Left  ventricular ejection fraction, by visual estimation, is 40 to 45%. The left ventricle has normal function. Normal left ventricular size. There is no left ventricular hypertrophy.  2. Left ventricular diastolic Doppler parameters are consistent with impaired relaxation pattern of LV diastolic filling.  3. Global right ventricle has normal systolic function.The right ventricular size is normal. No increase in right ventricular wall thickness.  4. Left atrial size was normal.  5. Right atrial size was normal.  6. The mitral valve is normal in structure. Mild mitral valve regurgitation. No evidence of mitral stenosis.  7. The tricuspid valve is normal in structure. Tricuspid valve regurgitation is trivial.  8. The aortic valve is normal in structure. Aortic valve regurgitation was not visualized by color flow Doppler. Structurally normal aortic valve, with no evidence  of sclerosis or stenosis.  9. The pulmonic valve was normal in structure. Pulmonic valve regurgitation is not visualized by color flow Doppler. 10. Normal pulmonary artery systolic pressure. 11. The inferior vena cava is normal in size with greater than 50% respiratory variability, suggesting right atrial pressure of 3 mmHg.   EKG:  EKG is personally reviewed today.  The ekg ordered 09/07/19 demonstrates normal sinus rhythm, LBBB  Recent Labs: 09/08/2019: ALT 21; TSH 0.869 09/09/2019: BUN 17; Creatinine, Ser 0.66; Hemoglobin 12.2; Platelets 235; Potassium 3.5; Sodium 139  Recent Lipid Panel    Component Value Date/Time   CHOL 194 09/09/2019 0329   TRIG 91 09/09/2019 0329   HDL 41 09/09/2019 0329   CHOLHDL 4.7 09/09/2019 0329   VLDL 18 09/09/2019 0329   LDLCALC 135 (H) 09/09/2019 0329    Physical Exam:    VS:  BP 140/82   Pulse 65   Temp 97.9 F (36.6 C)   Ht '4\' 10"'$  (1.473 m)   Wt 166 lb (75.3 kg)   SpO2 98%   BMI 34.69 kg/m     Wt Readings from Last 3 Encounters:  01/27/20 166 lb (75.3 kg)  09/25/19 169 lb (76.7 kg)    09/09/19 166 lb 7.2 oz (75.5 kg)    GEN: Well nourished, well developed in no acute distress HEENT: Normal, moist mucous membranes NECK: No JVD CARDIAC: regular rhythm, normal S1 and S2, no rubs or gallops. No murmur. VASCULAR: Radial and DP pulses 2+ bilaterally. No carotid bruits RESPIRATORY:  Clear to auscultation without rales, wheezing or rhonchi  ABDOMEN: Soft, non-tender, non-distended MUSCULOSKELETAL:  Ambulates independently SKIN: Warm and dry, trace bilateral LE edema NEUROLOGIC:  Alert and oriented x 3. No focal neuro deficits noted. PSYCHIATRIC:  Normal affect   ASSESSMENT:    1. Nonocclusive coronary atherosclerosis of native coronary artery   2. Systolic dysfunction without heart failure   3. Cardiac risk counseling   4. Counseling on health promotion and disease prevention    PLAN:    Nonobstructive CAD, history of Prinzmetal's angina, prior admission for chest pain:  -CT coronary with nonobstructive CAD -on aspirin 81 mg, carvedilol 6.25 mg BID -now on rosuvastatin 40 mg daily -we have previously discussed recommended avoidance of NSAIDs in CAD -has SL NG -counseled on red flag warning signs that need immediate medical attention -recheck lipids/LFTs at next follow up  Systolic dysfunction without clinical heart failure -EF 40-45% -nonobstructive CAD on CT coronary -tolerating carvedilol -has intermittently low BP into the 22G systolic. Unlikely to tolerate ACEi/ARB/ARNI.  -we have discussed heart failure symptoms to watch for  Cardiovascular risk counseling and prevention: -recommend heart healthy/Mediterranean diet, with whole grains, fruits, vegetable, fish, lean meats, nuts, and olive oil. Limit salt. -recommend moderate walking, 3-5 times/week for 30-50 minutes each session. Aim for at least 150 minutes.week. Goal should be pace of 3 miles/hours, or walking 1.5 miles in 30 minutes -recommend avoidance of tobacco products. Avoid excess alcohol. -ASCVD  risk score: The 10-year ASCVD risk score Mikey Bussing DC Brooke Bonito., et al., 2013) is: 15.3%   Values used to calculate the score:     Age: 4 years     Sex: Female     Is Non-Hispanic African American: No     Diabetic: No     Tobacco smoker: No     Systolic Blood Pressure: 254 mmHg     Is BP treated: No     HDL Cholesterol: 41 mg/dL     Total Cholesterol:  194 mg/dL   Lymphedema vs. Chronic insufficiency:  -pulses intact -well managed with lymphedema pump  Plan for follow up: 3 mos  Medication Adjustments/Labs and Tests Ordered: Current medicines are reviewed at length with the patient today.  Concerns regarding medicines are outlined above.  No orders of the defined types were placed in this encounter.  Meds ordered this encounter  Medications  . rosuvastatin (CRESTOR) 40 MG tablet    Sig: Take 1 tablet (40 mg total) by mouth daily.    Dispense:  30 tablet    Refill:  11    D/c previous rX    Patient Instructions  Medication Instructions:  Your Physician recommend you continue on your current medication as directed.    *If you need a refill on your cardiac medications before your next appointment, please call your pharmacy*  Lab Work: None  Testing/Procedures: None  Follow-Up: At [facility], you and your health needs are our priority.  As part of our continuing mission to provide you with exceptional heart care, we have created designated Provider Care Teams.  These Care Teams include your primary Cardiologist (physician) and Advanced Practice Providers (APPs -  Physician Assistants and Nurse Practitioners) who all work together to provide you with the care you need, when you need it.  Your next appointment:   3 month(s)  The format for your next appointment:   In Person  Provider:   Buford Dresser, MD  Other Instructions Call office if home blood pressure is consistently > 140 (top number).    Signed, Kristin Dresser, MD PhD 01/27/2020   [place]

## 2020-01-27 NOTE — Patient Instructions (Addendum)
Medication Instructions:  Your Physician recommend you continue on your current medication as directed.    *If you need a refill on your cardiac medications before your next appointment, please call your pharmacy*  Lab Work: None  Testing/Procedures: None  Follow-Up: At [facility], you and your health needs are our priority.  As part of our continuing mission to provide you with exceptional heart care, we have created designated Provider Care Teams.  These Care Teams include your primary Cardiologist (physician) and Advanced Practice Providers (APPs -  Physician Assistants and Nurse Practitioners) who all work together to provide you with the care you need, when you need it.  Your next appointment:   3 month(s)  The format for your next appointment:   In Person  Provider:   Buford Dresser, MD  Other Instructions Call office if home blood pressure is consistently > 140 (top number).

## 2020-02-04 ENCOUNTER — Ambulatory Visit (INDEPENDENT_AMBULATORY_CARE_PROVIDER_SITE_OTHER): Payer: Medicare Other | Admitting: Internal Medicine

## 2020-02-04 ENCOUNTER — Other Ambulatory Visit (INDEPENDENT_AMBULATORY_CARE_PROVIDER_SITE_OTHER): Payer: Medicare Other

## 2020-02-04 VITALS — BP 122/62 | HR 76 | Temp 98.9°F | Ht <= 58 in | Wt 168.0 lb

## 2020-02-04 DIAGNOSIS — R10814 Left lower quadrant abdominal tenderness: Secondary | ICD-10-CM | POA: Diagnosis not present

## 2020-02-04 DIAGNOSIS — R10812 Left upper quadrant abdominal tenderness: Secondary | ICD-10-CM

## 2020-02-04 LAB — CREATININE, SERUM: Creatinine, Ser: 0.66 mg/dL (ref 0.40–1.20)

## 2020-02-04 LAB — BUN: BUN: 15 mg/dL (ref 6–23)

## 2020-02-04 NOTE — Progress Notes (Signed)
Kristin Roach 79 y.o. 03-Aug-1941 [id number]  Assessment & Plan:   Encounter Diagnoses  Name Primary?  . Left upper quadrant abdominal tenderness without rebound tenderness Yes  . Left lower quadrant abdominal tenderness without rebound tenderness    There are features of abdominal wall/musculoskeletal problems as well as deeper - ? Organ involvement on exam.  Evaluate with CT abd/pelvis  I appreciate the opportunity to care for this patient. Cc;White, Delmar Landau, NP     Subjective:   Chief Complaint: ABDOMINAL PAIN  HPI 79 year old white woman here because of a several month history of left-sided abdominal pain flank infrascapular and even in the ribs and over towards the navel.  It seems to migrate at times but most of its in the left upper quadrant and flank and radiates downward and into the back.  It makes it worse when she moves and she cannot lie on the side.  It might be a little bit worse with eating but not necessarily so, defecation does not seem to affect it.  She does not complain of nausea or vomiting or classic heartburn to any great degree.  She was hospitalized with some chest pain a few months ago and had a negative cardiac evaluation including a CT of the coronaries.  It was thought she had Prinzmetal's angina.  Cardiology note from February 9 by Dr. Harrell Gave was reviewed by me today.  Last colonoscopy 2014 in [place] - sigmoid hyperplastic polyps - taken from path report do not have procedure report Allergies  Allergen Reactions  . Ergonovine Other (See Comments)    Used in heart study Reaction: Throat closed up   . Demerol [Meperidine] Itching  . Erythromycin Nausea And Vomiting  . Flagyl [Metronidazole] Hives    Large amounts  . Percocet [Oxycodone-Acetaminophen] Other (See Comments)    nightmares   Current Meds  Medication Sig  . acetaminophen (TYLENOL) 650 MG CR tablet Take 650 mg by mouth every 8 (eight) hours as needed for pain.  Marland Kitchen albuterol  (VENTOLIN HFA) 108 (90 Base) MCG/ACT inhaler Inhale 2 puffs into the lungs every 6 (six) hours as needed for wheezing.  Marland Kitchen aspirin 81 MG tablet Take 81 mg by mouth daily.  . budesonide-formoterol (SYMBICORT) 80-4.5 MCG/ACT inhaler Inhale 2 puffs into the lungs as needed.   . carvedilol (COREG) 12.5 MG tablet Take 0.5 tablets (6.25 mg total) by mouth 2 (two) times daily with a meal.  . diazepam (VALIUM) 5 MG tablet Take 5 mg by mouth as needed for anxiety (when she takes nitroglycerin).   Marland Kitchen diclofenac sodium (VOLTAREN) 1 % GEL Apply 1 application topically 4 (four) times daily as needed for pain.  Marland Kitchen HYDROcodone-acetaminophen (NORCO) 10-325 MG tablet Take 1 tablet by mouth every 4 (four) hours as needed.  . meclizine (ANTIVERT) 25 MG tablet Take 25 mg by mouth as needed.  . nitroGLYCERIN (NITROSTAT) 0.4 MG SL tablet Place 1 tablet (0.4 mg total) under the tongue every 5 (five) minutes as needed for chest pain.  Marland Kitchen rOPINIRole (REQUIP) 1 MG tablet Take by mouth.  . rosuvastatin (CRESTOR) 40 MG tablet Take 1 tablet (40 mg total) by mouth daily.  . sertraline (ZOLOFT) 50 MG tablet Take by mouth.  . [DISCONTINUED] diclofenac Sodium (VOLTAREN) 1 % GEL Place onto the skin.   Past Medical History:  Diagnosis Date  . Angina at rest [facility])   . Arthritis   . Asthma    bronchial asthma  . Cataracts, bilateral   .  Complication of anesthesia    woke up during back procedure  . HOH (hard of hearing)   . Pneumonia    hx of 2012  . PONV (postoperative nausea and vomiting)    with knee surgery in 11 2015  . RLS (restless legs syndrome)   . Sleep apnea    cpap   Past Surgical History:  Procedure Laterality Date  . ABDOMINAL HYSTERECTOMY    . BACK SURGERY     laminectomy  . BELPHAROPTOSIS REPAIR    . CARDIAC CATHETERIZATION    . CESAREAN SECTION    . CHOLECYSTECTOMY    . HERNIA REPAIR    . SHOULDER SURGERY     removal of bone spur  . TONSILLECTOMY    . TOTAL KNEE ARTHROPLASTY Left 10/26/2014    Procedure: LEFT TOTAL KNEE ARTHROPLASTY;  Surgeon: Kerin Salen, MD;  Location: [place];  Service: Orthopedics;  Laterality: Left;   Social History   Social History Narrative   Widow   1 son and 1 daughter (another son - twin boy, died in childhood)   Has helped granddaughter with alcoholism, daughter has had methamphetamine addiction   Former smoker   1 caffeine/day   No etOH   family history includes Breast cancer in her paternal grandmother; Breast cancer (age of onset: 23) in her sister; Heart attack in her mother; Leukemia in her father.   Review of Systems Per HPI Hearing aids Anxiety Back and joint pain Fatigue Lymphedema Increased urination O/w neg  Objective:   Physical Exam BP 122/62   Pulse 76   Temp 98.9 F (37.2 C)   Ht 4\' 10"  (1.473 m)   Wt 168 lb (76.2 kg)   BMI 35.11 kg/m  Elderly NAD Anicteric Lungs cta Cor s1s2 Back and ribs are not tender abd obes w/ LUQ, mid and lower quad tenderness to deep paplation that is still present w/ mm tension Alert and oriented x 3

## 2020-02-04 NOTE — Patient Instructions (Signed)
Your provider has requested that you go to the basement level for lab work before leaving today. Press "B" on the elevator. The lab is located at the first door on the left as you exit the elevator.   You have been scheduled for a CT scan of the abdomen and pelvis at [place].  You are scheduled on 02/10/2020 at 3:10PM. You should arrive 20 minutes prior to your appointment time for registration. Please follow the written instructions below on the day of your exam:  WARNING: IF YOU ARE ALLERGIC TO IODINE/X-RAY DYE, PLEASE NOTIFY RADIOLOGY IMMEDIATELY AT [phone]! YOU WILL BE GIVEN A 13 HOUR PREMEDICATION PREP.  1) Do not eat or drink anything after 11:00AM (4 hours prior to your test) 2) You have been given 2 bottles of oral contrast to drink. The solution may taste better if refrigerated, but do NOT add ice or any other liquid to this solution. Shake well before drinking.    Drink 1 bottle of contrast @ 1:00pm (2 hours prior to your exam)  Drink 1 bottle of contrast @ 2:00pm (1 hour prior to your exam)  You may take any medications as prescribed with a small amount of water, if necessary. If you take any of the following medications: METFORMIN, GLUCOPHAGE, GLUCOVANCE, AVANDAMET, RIOMET, FORTAMET, [place] MET, JANUMET, GLUMETZA or METAGLIP, you MAY be asked to HOLD this medication 48 hours AFTER the exam.  The purpose of you drinking the oral contrast is to aid in the visualization of your intestinal tract. The contrast solution may cause some diarrhea. Depending on your individual set of symptoms, you may also receive an intravenous injection of x-ray contrast/dye.   This test typically takes 30-45 minutes to complete.  If you have any questions regarding your exam or if you need to reschedule, you may call the CT department at [phone] between the hours of 8:00 am and 5:00 pm, Monday-Friday.  ________________________________________________________________________  I  appreciate the opportunity to care for you. Silvano Rusk, MD, [facility]

## 2020-02-06 ENCOUNTER — Encounter: Payer: Self-pay | Admitting: Internal Medicine

## 2020-02-10 ENCOUNTER — Ambulatory Visit
Admission: RE | Admit: 2020-02-10 | Discharge: 2020-02-10 | Disposition: A | Discharge status: Home / Self Care | Payer: Medicare Other | Source: Ambulatory Visit | Attending: Internal Medicine | Admitting: Internal Medicine

## 2020-02-10 DIAGNOSIS — R10812 Left upper quadrant abdominal tenderness: Secondary | ICD-10-CM

## 2020-02-10 DIAGNOSIS — R10814 Left lower quadrant abdominal tenderness: Secondary | ICD-10-CM

## 2020-02-10 MED ORDER — IOPAMIDOL (ISOVUE-300) INJECTION 61%
100.0000 mL | Freq: Once | INTRAVENOUS | Status: AC | PRN
  Administered 2020-02-10: 15:00:00 100 mL via INTRAVENOUS

## 2020-03-24 ENCOUNTER — Encounter: Payer: Self-pay | Admitting: Cardiology

## 2020-04-07 ENCOUNTER — Ambulatory Visit: Payer: Medicare Other | Attending: Internal Medicine

## 2020-04-07 DIAGNOSIS — Z23 Encounter for immunization: Secondary | ICD-10-CM

## 2020-04-07 NOTE — Progress Notes (Signed)
   Covid-19 Vaccination Clinic  Name:  MAELYN SALSER    MRN: [id number] DOB: August 12, 1941  04/07/2020  Ms. Nur was observed post Covid-19 immunization for 15 minutes without incident. She was provided with Vaccine Information Sheet and instruction to access the V-Safe system.   Ms. Brisky was instructed to call 911 with any severe reactions post vaccine: Marland Kitchen Difficulty breathing  . Swelling of face and throat  . A fast heartbeat  . A bad rash all over body  . Dizziness and weakness   Immunizations Administered    Name Date Dose VIS Date Route   Pfizer COVID-19 Vaccine 04/07/2020 10:51 AM 0.3 mL 02/11/2019 Intramuscular   Manufacturer: [place]   Lot: [id number]   [place]: [id number]

## 2020-04-18 ENCOUNTER — Emergency Department (HOSPITAL_BASED_OUTPATIENT_CLINIC_OR_DEPARTMENT_OTHER): Payer: Medicare Other

## 2020-04-18 ENCOUNTER — Emergency Department (HOSPITAL_BASED_OUTPATIENT_CLINIC_OR_DEPARTMENT_OTHER)
Admission: EM | Admit: 2020-04-18 | Discharge: 2020-04-18 | Disposition: A | Discharge status: Home / Self Care | Payer: Medicare Other | Attending: Emergency Medicine | Admitting: Emergency Medicine

## 2020-04-18 ENCOUNTER — Other Ambulatory Visit: Payer: Self-pay

## 2020-04-18 ENCOUNTER — Encounter (HOSPITAL_BASED_OUTPATIENT_CLINIC_OR_DEPARTMENT_OTHER): Payer: Self-pay

## 2020-04-18 DIAGNOSIS — Z7982 Long term (current) use of aspirin: Secondary | ICD-10-CM | POA: Diagnosis not present

## 2020-04-18 DIAGNOSIS — R109 Unspecified abdominal pain: Secondary | ICD-10-CM | POA: Diagnosis present

## 2020-04-18 DIAGNOSIS — Z885 Allergy status to narcotic agent status: Secondary | ICD-10-CM | POA: Diagnosis not present

## 2020-04-18 DIAGNOSIS — Z888 Allergy status to other drugs, medicaments and biological substances status: Secondary | ICD-10-CM | POA: Insufficient documentation

## 2020-04-18 DIAGNOSIS — Z881 Allergy status to other antibiotic agents status: Secondary | ICD-10-CM | POA: Diagnosis not present

## 2020-04-18 DIAGNOSIS — Z79899 Other long term (current) drug therapy: Secondary | ICD-10-CM | POA: Diagnosis not present

## 2020-04-18 DIAGNOSIS — N3944 Nocturnal enuresis: Secondary | ICD-10-CM | POA: Insufficient documentation

## 2020-04-18 DIAGNOSIS — W19XXXA Unspecified fall, initial encounter: Secondary | ICD-10-CM

## 2020-04-18 LAB — COMPREHENSIVE METABOLIC PANEL
ALT: 21 U/L (ref 0–44)
AST: 23 U/L (ref 15–41)
Albumin: 4.2 g/dL (ref 3.5–5.0)
Alkaline Phosphatase: 73 U/L (ref 38–126)
Anion gap: 9 (ref 5–15)
BUN: 14 mg/dL (ref 8–23)
CO2: 29 mmol/L (ref 22–32)
Calcium: 9.2 mg/dL (ref 8.9–10.3)
Chloride: 103 mmol/L (ref 98–111)
Creatinine, Ser: 0.58 mg/dL (ref 0.44–1.00)
GFR calc Af Amer: >60 mL/min (ref >60)
GFR calc non Af Amer: >60 mL/min (ref >60)
Glucose, Bld: 100 mg/dL — ABNORMAL HIGH (ref 70–99)
Potassium: 4.1 mmol/L (ref 3.5–5.1)
Sodium: 141 mmol/L (ref 135–145)
Total Bilirubin: 0.3 mg/dL (ref 0.3–1.2)
Total Protein: 7.2 g/dL (ref 6.5–8.1)

## 2020-04-18 LAB — URINALYSIS, ROUTINE W REFLEX MICROSCOPIC
Bilirubin Urine: NEGATIVE
Glucose, UA: NEGATIVE mg/dL
Hgb urine dipstick: NEGATIVE
Ketones, ur: NEGATIVE mg/dL
Leukocytes,Ua: NEGATIVE
Nitrite: NEGATIVE
Protein, ur: NEGATIVE mg/dL
Specific Gravity, Urine: >1.03 — ABNORMAL HIGH (ref 1.005–1.030)
pH: 6 (ref 5.0–8.0)

## 2020-04-18 LAB — CBC
HCT: 39.8 % (ref 36.0–46.0)
Hemoglobin: 12.2 g/dL (ref 12.0–15.0)
MCH: 28.5 pg (ref 26.0–34.0)
MCHC: 30.7 g/dL (ref 30.0–36.0)
MCV: 93 fL (ref 80.0–100.0)
Platelets: 260 10*3/uL (ref 150–400)
RBC: 4.28 MIL/uL (ref 3.87–5.11)
RDW: 13.5 % (ref 11.5–15.5)
WBC: 10.4 10*3/uL (ref 4.0–10.5)
nRBC: 0 % (ref 0.0–0.2)

## 2020-04-18 LAB — LIPASE, BLOOD: Lipase: 23 U/L (ref 11–51)

## 2020-04-18 MED ORDER — SODIUM CHLORIDE 0.9 % IV BOLUS
500.0000 mL | Freq: Once | INTRAVENOUS | Status: AC
  Administered 2020-04-18: 14:00:00 500 mL via INTRAVENOUS

## 2020-04-18 MED ORDER — IOHEXOL 300 MG/ML  SOLN
100.0000 mL | Freq: Once | INTRAMUSCULAR | Status: AC | PRN
  Administered 2020-04-18: 100 mL via INTRAVENOUS

## 2020-04-18 NOTE — ED Provider Notes (Signed)
[facility] EMERGENCY DEPARTMENT Provider Note   CSN: [id number] Arrival date & time: 04/18/20  1156     History Chief Complaint  Patient presents with  . Abdominal Pain    Kristin Roach is a 79 y.o. female.  Pt presents to the ED today with urinary incontinence last night.  Pt said she has frequent falls.  She fell on Friday (4/30) going up the stairs.  She bruised both of her lower legs and her abdomen.  She woke up this morning incontinent of urine and was worried she punctured her bladder.  She has been able to control her urine since then.  Pt denies any dysuria.  No f/c.  No sob.         Past Medical History:  Diagnosis Date  . Angina at rest [facility])   . Arthritis   . Asthma    bronchial asthma  . Cataracts, bilateral   . Complication of anesthesia    woke up during back procedure  . HOH (hard of hearing)   . Pneumonia    hx of 2012  . PONV (postoperative nausea and vomiting)    with knee surgery in 11 2015  . RLS (restless legs syndrome)   . Sleep apnea    cpap    Patient Active Problem List   Diagnosis Date Noted  . Chest pain 09/07/2019  . Arthritis of left knee Primary 10/26/2014    Past Surgical History:  Procedure Laterality Date  . ABDOMINAL HYSTERECTOMY    . BACK SURGERY     laminectomy  . BELPHAROPTOSIS REPAIR    . CARDIAC CATHETERIZATION    . CESAREAN SECTION    . CHOLECYSTECTOMY    . COLONOSCOPY  2014   sigmoid hyperplastic polyps  . HERNIA REPAIR    . SHOULDER SURGERY     removal of bone spur  . TONSILLECTOMY    . TOTAL KNEE ARTHROPLASTY Left 10/26/2014   Procedure: LEFT TOTAL KNEE ARTHROPLASTY;  Surgeon: Kerin Salen, MD;  Location: [facility];  Service: Orthopedics;  Laterality: Left;     OB History   No obstetric history on file.     Family History  Problem Relation Age of Onset  . Heart attack Mother        (died at 52 from MI)  . Leukemia Father   . Breast cancer Sister 34  . Breast cancer Paternal Grandmother      Social History   Tobacco Use  . Smoking status: Never Smoker  . Smokeless tobacco: Never Used  Substance Use Topics  . Alcohol use: Not Currently    Alcohol/week: 14.0 standard drinks    Types: 14 Glasses of wine per week  . Drug use: No    Home Medications Prior to Admission medications   Medication Sig Start Date End Date Taking? Authorizing Provider  acetaminophen (TYLENOL) 650 MG CR tablet Take 650 mg by mouth every 8 (eight) hours as needed for pain.    [provider]  albuterol (VENTOLIN HFA) 108 (90 Base) MCG/ACT inhaler Inhale 2 puffs into the lungs every 6 (six) hours as needed for wheezing. 12/13/18   [provider]  aspirin 81 MG tablet Take 81 mg by mouth daily.    [provider]  budesonide-formoterol (SYMBICORT) 80-4.5 MCG/ACT inhaler Inhale 2 puffs into the lungs as needed.     [provider]  carvedilol (COREG) 12.5 MG tablet Take 0.5 tablets (6.25 mg total) by mouth 2 (two) times  daily with a meal. 09/25/19 09/19/20  Buford Dresser, MD  diazepam (VALIUM) 5 MG tablet Take 5 mg by mouth as needed for anxiety (when she takes nitroglycerin).  06/13/17   [provider]  diclofenac sodium (VOLTAREN) 1 % GEL Apply 1 application topically 4 (four) times daily as needed for pain. 04/11/19   [provider]  HYDROcodone-acetaminophen (NORCO) 10-325 MG tablet Take 1 tablet by mouth every 4 (four) hours as needed. 12/26/19   [provider]  meclizine (ANTIVERT) 25 MG tablet Take 25 mg by mouth as needed. 04/11/19   [provider]  nitroGLYCERIN (NITROSTAT) 0.4 MG SL tablet Place 1 tablet (0.4 mg total) under the tongue every 5 (five) minutes as needed for chest pain. 09/09/19   Kathi Ludwig, MD  rOPINIRole (REQUIP) 1 MG tablet Take by mouth. 09/23/19   [provider]  rosuvastatin (CRESTOR) 40 MG tablet Take 1 tablet (40 mg total) by mouth daily. 01/27/20 04/26/20  Buford Dresser,  MD  sertraline (ZOLOFT) 50 MG tablet Take by mouth. 09/23/19   [provider]    Allergies    Ergonovine, Demerol [meperidine], Erythromycin, Flagyl [metronidazole], and Percocet [oxycodone-acetaminophen]  Review of Systems   Review of Systems  Gastrointestinal: Positive for abdominal pain.  All other systems reviewed and are negative.   Physical Exam Updated Vital Signs BP (!) 100/56 (BP Location: Left Arm) Comment: pt states this is her baseline  Pulse 77   Temp 98.4 F (36.9 C) (Oral)   Resp 18   Ht 5' (1.524 m)   Wt 72.6 kg   SpO2 95%   BMI 31.25 kg/m   Physical Exam Vitals and nursing note reviewed.  Constitutional:      Appearance: She is well-developed.  HENT:     Head: Normocephalic and atraumatic.     Mouth/Throat:     Mouth: Mucous membranes are moist.     Pharynx: Oropharynx is clear.  Eyes:     Extraocular Movements: Extraocular movements intact.     Pupils: Pupils are equal, round, and reactive to light.  Cardiovascular:     Rate and Rhythm: Normal rate and regular rhythm.  Pulmonary:     Effort: Pulmonary effort is normal.     Breath sounds: Normal breath sounds.  Abdominal:     General: Abdomen is flat. Bowel sounds are normal.     Palpations: Abdomen is soft.     Tenderness: There is abdominal tenderness in the suprapubic area.  Skin:    General: Skin is warm.     Capillary Refill: Capillary refill takes less than 2 seconds.  Neurological:     General: No focal deficit present.     Mental Status: She is alert and oriented to person, place, and time.  Psychiatric:        Mood and Affect: Mood normal.        Behavior: Behavior normal.     ED Results / Procedures / Treatments   Labs (all labs ordered are listed, but only abnormal results are displayed) Labs Reviewed  COMPREHENSIVE METABOLIC PANEL - Abnormal; Notable for the following components:      Result Value   Glucose, Bld 100 (*)    All other components within normal limits   URINALYSIS, ROUTINE W REFLEX MICROSCOPIC - Abnormal; Notable for the following components:   Specific Gravity, Urine >1.030 (*)    All other components within normal limits  LIPASE, BLOOD  CBC    EKG None  Radiology  CT ABDOMEN PELVIS W CONTRAST  Result Date: 04/18/2020 CLINICAL DATA:  Fall going up stairs 2 days ago with abdominal and back pain. EXAM: CT ABDOMEN AND PELVIS WITH CONTRAST TECHNIQUE: Multidetector CT imaging of the abdomen and pelvis was performed using the standard protocol following bolus administration of intravenous contrast. CONTRAST:  129mL OMNIPAQUE IOHEXOL 300 MG/ML  SOLN COMPARISON:  02/10/2020 FINDINGS: Lower chest: Lung bases are normal. Hepatobiliary: Previous cholecystectomy. No focal liver mass. Stable post cholecystectomy prominence of the common bile duct and central intrahepatic ducts. Pancreas: Normal. Spleen: Normal. Adrenals/Urinary Tract: Adrenal glands are normal. Kidneys are normal in size without hydronephrosis or nephrolithiasis. Ureters and bladder are normal. Stomach/Bowel: Stomach is normal. Small bowel is unremarkable. Appendix is normal. There is diverticulosis of the colon without active inflammation. Vascular/Lymphatic: Mild calcified plaque over the abdominal aorta which is normal in caliber. No evidence of adenopathy. Reproductive: Previous hysterectomy.  Adnexa unremarkable. Other: Subtle free fluid over the mesentery of the mid abdomen without significant change. Musculoskeletal: No acute findings. IMPRESSION: 1.  No acute findings in the abdomen/pelvis.  No acute fracture. 2. Previous cholecystectomy with stable chronic dilatation of the intrahepatic ducts and common bile duct. 3. Stable subtle free fluid over the mesentery of the mid abdomen. Findings could be seen with mild chronic mesenteric panniculitis. 4.  Colonic diverticulosis. 5.  Aortic Atherosclerosis (ICD10-I70.0). Electronically Signed   By: Marin Olp M.D.   On: 04/18/2020 15:12     Procedures Procedures (including critical care time)  Medications Ordered in ED Medications  sodium chloride 0.9 % bolus 500 mL (0 mLs Intravenous Stopped 04/18/20 1444)  iohexol (OMNIPAQUE) 300 MG/ML solution 100 mL (100 mLs Intravenous Contrast Given 04/18/20 1448)    ED Course  I have reviewed the triage vital signs and the nursing notes.  Pertinent labs & imaging results that were available during my care of the patient were reviewed by me and considered in my medical decision making (see chart for details).    MDM Rules/Calculators/A&P                      No evidence of internal injury.  Pt is ambulatory and has had bladder control since this am.  No concern for cord compression.  Pt is stable for d/c.  Return if worse.  Final Clinical Impression(s) / ED Diagnoses Final diagnoses:  Nocturnal enuresis  Fall, initial encounter    Rx / DC Orders ED Discharge Orders    None       Isla Pence, MD 04/18/20 1523

## 2020-04-18 NOTE — ED Notes (Signed)
Pt verbalized understanding of dc instructions.

## 2020-04-18 NOTE — ED Triage Notes (Signed)
Pt reports she had a fall on Friday going up concrete steps falling forward c/o pain to both legs, abdomen, and back reports she woke up this morning covered in urine, states that she was not aware she had gone.

## 2020-04-18 NOTE — ED Notes (Signed)
Patient to CT via stretcher.

## 2020-04-26 ENCOUNTER — Other Ambulatory Visit: Payer: Self-pay

## 2020-04-26 ENCOUNTER — Ambulatory Visit (INDEPENDENT_AMBULATORY_CARE_PROVIDER_SITE_OTHER): Payer: Medicare Other | Admitting: Cardiology

## 2020-04-26 ENCOUNTER — Encounter: Payer: Self-pay | Admitting: Cardiology

## 2020-04-26 VITALS — BP 140/76 | HR 65 | Temp 96.8°F | Ht 59.0 in | Wt 169.0 lb

## 2020-04-26 DIAGNOSIS — Z79899 Other long term (current) drug therapy: Secondary | ICD-10-CM

## 2020-04-26 DIAGNOSIS — R0989 Other specified symptoms and signs involving the circulatory and respiratory systems: Secondary | ICD-10-CM

## 2020-04-26 DIAGNOSIS — I5189 Other ill-defined heart diseases: Secondary | ICD-10-CM

## 2020-04-26 DIAGNOSIS — Z7189 Other specified counseling: Secondary | ICD-10-CM

## 2020-04-26 DIAGNOSIS — I2510 Atherosclerotic heart disease of native coronary artery without angina pectoris: Secondary | ICD-10-CM | POA: Diagnosis not present

## 2020-04-26 NOTE — Patient Instructions (Signed)
Medication Instructions:  Your Physician recommend you continue on your current medication as directed.    *If you need a refill on your cardiac medications before your next appointment, please call your pharmacy*   Lab Work: Your physician recommends that you return for lab work today ( Lipid, LFT)  If you have labs (blood work) drawn today and your tests are completely normal, you will receive your results only by: Marland Kitchen MyChart Message (if you have MyChart) OR . A paper copy in the mail If you have any lab test that is abnormal or we need to change your treatment, we will call you to review the results.   Testing/Procedures: None   Follow-Up: At [facility], you and your health needs are our priority.  As part of our continuing mission to provide you with exceptional heart care, we have created designated Provider Care Teams.  These Care Teams include your primary Cardiologist (physician) and Advanced Practice Providers (APPs -  Physician Assistants and Nurse Practitioners) who all work together to provide you with the care you need, when you need it.  We recommend signing up for the patient portal called "MyChart".  Sign up information is provided on this After Visit Summary.  MyChart is used to connect with patients for Virtual Visits (Telemedicine).  Patients are able to view lab/test results, encounter notes, upcoming appointments, etc.  Non-urgent messages can be sent to your provider as well.   To learn more about what you can do with MyChart, go to [email or web address].    Your next appointment:   6 month(s)  The format for your next appointment:   In Person  Provider:   Buford Dresser, MD

## 2020-04-26 NOTE — Progress Notes (Signed)
Cardiology Office Note:    Date:  04/26/2020   ID:  Kristin Roach, DOB 07-10-1941, MRN [id number]  PCP:  Kristin Booze, NP  Cardiologist:  Kristin Dresser, MD PhD  Referring MD: Kristin Booze, NP   CC: follow up  History of Present Illness:    Kristin Roach is a 79 y.o. female with a hx of OSA, asthma, arthritis, Prinzmetal's angina. I initially met her 07/30/18 as a new consult.  Cardiac history: Saw Dr. Bayard Roach in [place], [place] (Fax [phone]). Notes states: Heart murmur, mitral valve prolapse; sleep apnea, uses CPAP. Dr. Joaquim Roach at [facility] was her internal medicine physician. Thinks her last stress test was around 2012. Echo in 2017. Had prior heart caths in the early 1990s, did not require stents but does report that she had vasconstriction with ergotamine.  Admitted 09/07/19 for chest pain. Seen by Dr. Irish Roach in the hospital. Had CT coronary with nonobstructive disease.  Today: Had a fall carrying groceries up the stairs, fell and bruised both her shins and threw her back out. Then two days later had nocturnal enuresis. Has had issues with falls chronically, but not syncope--all mechanical falls.  Has been tolerating rosuvastatin without issues. Blood pressure has been somewhat variable. Rare chest pain, has taken nitroglycerin twice with good results.  Denies shortness of breath at rest or with normal exertion. No PND, orthopnea, LE edema or unexpected weight gain. No syncope or palpitations.  Past Medical History:  Diagnosis Date  . Angina at rest [facility])   . Arthritis   . Asthma    bronchial asthma  . Cataracts, bilateral   . Complication of anesthesia    woke up during back procedure  . HOH (hard of hearing)   . Pneumonia    hx of 2012  . PONV (postoperative nausea and vomiting)    with knee surgery in 11 2015  . RLS (restless legs syndrome)   . Sleep apnea    cpap    Past Surgical History:  Procedure Laterality Date  . ABDOMINAL HYSTERECTOMY     . BACK SURGERY     laminectomy  . BELPHAROPTOSIS REPAIR    . CARDIAC CATHETERIZATION    . CESAREAN SECTION    . CHOLECYSTECTOMY    . COLONOSCOPY  2014   sigmoid hyperplastic polyps  . HERNIA REPAIR    . SHOULDER SURGERY     removal of bone spur  . TONSILLECTOMY    . TOTAL KNEE ARTHROPLASTY Left 10/26/2014   Procedure: LEFT TOTAL KNEE ARTHROPLASTY;  Surgeon: Kerin Salen, MD;  Location: [facility];  Service: Orthopedics;  Laterality: Left;    Current Medications: Current Outpatient Medications on File Prior to Visit  Medication Sig  . acetaminophen (TYLENOL) 650 MG CR tablet Take 650 mg by mouth every 8 (eight) hours as needed for pain.  Marland Kitchen albuterol (VENTOLIN HFA) 108 (90 Base) MCG/ACT inhaler Inhale 2 puffs into the lungs every 6 (six) hours as needed for wheezing.  Marland Kitchen aspirin 81 MG tablet Take 81 mg by mouth daily.  . budesonide-formoterol (SYMBICORT) 80-4.5 MCG/ACT inhaler Inhale 2 puffs into the lungs as needed.   Marland Kitchen buPROPion (WELLBUTRIN SR) 150 MG 12 hr tablet Take by mouth.  . carvedilol (COREG) 12.5 MG tablet Take by mouth.  . diazepam (VALIUM) 5 MG tablet Take 5 mg by mouth as needed for anxiety (when she takes nitroglycerin).   Marland Kitchen diclofenac sodium (VOLTAREN) 1 % GEL Apply 1 application topically 4 (four)  times daily as needed for pain.  Marland Kitchen HYDROcodone-acetaminophen (NORCO) 10-325 MG tablet Take 1 tablet by mouth every 4 (four) hours as needed.  . meclizine (ANTIVERT) 25 MG tablet Take 25 mg by mouth as needed.  . nitroGLYCERIN (NITROSTAT) 0.4 MG SL tablet Place 1 tablet (0.4 mg total) under the tongue every 5 (five) minutes as needed for chest pain.  Marland Kitchen rOPINIRole (REQUIP) 1 MG tablet Take by mouth.  . rosuvastatin (CRESTOR) 40 MG tablet Take by mouth.  . sertraline (ZOLOFT) 50 MG tablet Take by mouth.   No current facility-administered medications on file prior to visit.     Allergies:   Ergonovine, Demerol [meperidine], Erythromycin, Flagyl [metronidazole], and Percocet  [oxycodone-acetaminophen]   Social History   Tobacco Use  . Smoking status: Never Smoker  . Smokeless tobacco: Never Used  Substance Use Topics  . Alcohol use: Not Currently    Alcohol/week: 14.0 standard drinks    Types: 14 Glasses of wine per week  . Drug use: No   Family history: mother passed from MI in age 38, father died of leukemia at age 3. Two grandparents died from MI. Sister just died of lung cancer at age 38. No other siblings. Son and daughter both have terrible migraines. Daughter was a methamphetamine user, had been sober for 8 years but has some residual issues.   ROS:   Please see the history of present illness.  Additional pertinent ROS otherwise unremarkable.    EKGs/Labs/Other Studies Reviewed:    The following studies were reviewed today:  CT 13-Sep-2019 Aorta:  Normal size.  Mild diffuse calcifications.  No dissection.  Aortic Valve:  Trileaflet.  Mild calcifications.  Coronary Arteries:  Normal coronary origin.  Right dominance.  RCA is a large dominant artery that gives rise to PDA and PLA. There are only minimal irregularities.  Left main is a large artery that gives rise to LAD and LCX arteries.  LAD is a large vessel that has mild calcified plaque in the proximal and mid portion with stenosis 25-49%.  LCX is a large non-dominant artery that gives rise to one large OM1 branch. There are only minimal irregularities.  OM1 is a very large branch that has no plaque.  Other findings:  Normal pulmonary vein drainage into the left atrium.  Normal left atrial appendage without a thrombus.  IMPRESSION: 1. Coronary calcium score of 208. This was 27 percentile for age and sex matched control.  2. Normal coronary origin with right dominance.  3. CAD-RADS 2. Mild non-obstructive CAD (25-49%). Consider non-atherosclerotic causes of chest pain. Consider preventive therapy and risk factor modification.  4. Mildly dilated pulmonary  artery measuring 32 mm.  Echo 09/08/19  1. Left ventricular ejection fraction, by visual estimation, is 40 to 45%. The left ventricle has normal function. Normal left ventricular size. There is no left ventricular hypertrophy.  2. Left ventricular diastolic Doppler parameters are consistent with impaired relaxation pattern of LV diastolic filling.  3. Global right ventricle has normal systolic function.The right ventricular size is normal. No increase in right ventricular wall thickness.  4. Left atrial size was normal.  5. Right atrial size was normal.  6. The mitral valve is normal in structure. Mild mitral valve regurgitation. No evidence of mitral stenosis.  7. The tricuspid valve is normal in structure. Tricuspid valve regurgitation is trivial.  8. The aortic valve is normal in structure. Aortic valve regurgitation was not visualized by color flow Doppler. Structurally normal aortic valve, with no  evidence of sclerosis or stenosis.  9. The pulmonic valve was normal in structure. Pulmonic valve regurgitation is not visualized by color flow Doppler. 10. Normal pulmonary artery systolic pressure. 11. The inferior vena cava is normal in size with greater than 50% respiratory variability, suggesting right atrial pressure of 3 mmHg.   EKG:  EKG is personally reviewed today.  The ekg ordered 09/07/19 demonstrates normal sinus rhythm, LBBB  Recent Labs: 09/08/2019: TSH 0.869 04/18/2020: ALT 21; BUN 14; Creatinine, Ser 0.58; Hemoglobin 12.2; Platelets 260; Potassium 4.1; Sodium 141  Recent Lipid Panel    Component Value Date/Time   CHOL 194 09/09/2019 0329   TRIG 91 09/09/2019 0329   HDL 41 09/09/2019 0329   CHOLHDL 4.7 09/09/2019 0329   VLDL 18 09/09/2019 0329   LDLCALC 135 (H) 09/09/2019 0329    Physical Exam:    VS:  BP 140/76   Pulse 65   Temp (!) 96.8 F (36 C)   Ht '4\' 11"'$  (1.499 m)   Wt 169 lb (76.7 kg)   SpO2 95%   BMI 34.13 kg/m     Wt Readings from Last 3 Encounters:    04/26/20 169 lb (76.7 kg)  04/18/20 160 lb (72.6 kg)  02/04/20 168 lb (76.2 kg)    GEN: Well nourished, well developed in no acute distress HEENT: Normal, moist mucous membranes NECK: No JVD CARDIAC: regular rhythm, normal S1 and S2, no rubs or gallops. No murmur. VASCULAR: Radial and DP pulses 2+ bilaterally. No carotid bruits RESPIRATORY:  Clear to auscultation without rales, wheezing or rhonchi  ABDOMEN: Soft, non-tender, non-distended MUSCULOSKELETAL:  Ambulates independently SKIN: Warm and dry, trace bilateral LE edema NEUROLOGIC:  Alert and oriented x 3. No focal neuro deficits noted. PSYCHIATRIC:  Normal affect   ASSESSMENT:    1. Nonocclusive coronary atherosclerosis of native coronary artery   2. Medication management   3. Systolic dysfunction without heart failure   4. Cardiac risk counseling   5. Counseling on health promotion and disease prevention   6. Labile blood pressure    PLAN:    Nonobstructive CAD, history of Prinzmetal's angina, prior admission for chest pain:  -CT coronary with nonobstructive CAD -on aspirin 81 mg, carvedilol 6.25 mg BID -continue rosuvastatin 40 mg daily, tolerating -we have previously discussed recommended avoidance of NSAIDs in CAD -has SL NG, used rarely -counseled on red flag warning signs that need immediate medical attention -recheck lipids/LFTs today  Systolic dysfunction without clinical heart failure -EF 40-45% -nonobstructive CAD on CT coronary -tolerating carvedilol -we have discussed heart failure symptoms to watch for -has had hypotension in the past, limited ACEi/ARB/ARNI. BP fluctuates but is still variable. See below.  Labile BP: -has been variable. Slightly elevated today -continue to monitor at home -if consistently elevated, can either go up on carvedilol or add amlodipine given history of angina vs. ARB/ARNI with systolic dysfunction  Cardiovascular risk counseling and prevention: -recommend heart  healthy/Mediterranean diet, with whole grains, fruits, vegetable, fish, lean meats, nuts, and olive oil. Limit salt. -recommend moderate walking, 3-5 times/week for 30-50 minutes each session. Aim for at least 150 minutes.week. Goal should be pace of 3 miles/hours, or walking 1.5 miles in 30 minutes -recommend avoidance of tobacco products. Avoid excess alcohol. -ASCVD risk score: The 10-year ASCVD risk score Mikey Bussing DC Jr., et al., 2013) is: 26.8%   Values used to calculate the score:     Age: 38 years     Sex: Female     Is  Non-Hispanic African American: No     Diabetic: No     Tobacco smoker: No     Systolic Blood Pressure: 031 mmHg     Is BP treated: No     HDL Cholesterol: 41 mg/dL     Total Cholesterol: 194 mg/dL   Plan for follow up: 6 mos  Medication Adjustments/Labs and Tests Ordered: Current medicines are reviewed at length with the patient today.  Concerns regarding medicines are outlined above.  Orders Placed This Encounter  Procedures  . Lipid panel  . Hepatic function panel   No orders of the defined types were placed in this encounter.   Patient Instructions  Medication Instructions:  Your Physician recommend you continue on your current medication as directed.    *If you need a refill on your cardiac medications before your next appointment, please call your pharmacy*   Lab Work: Your physician recommends that you return for lab work today ( Lipid, LFT)  If you have labs (blood work) drawn today and your tests are completely normal, you will receive your results only by: Marland Kitchen MyChart Message (if you have MyChart) OR . A paper copy in the mail If you have any lab test that is abnormal or we need to change your treatment, we will call you to review the results.   Testing/Procedures: None   Follow-Up: At [facility], you and your health needs are our priority.  As part of our continuing mission to provide you with exceptional heart care, we have created  designated Provider Care Teams.  These Care Teams include your primary Cardiologist (physician) and Advanced Practice Providers (APPs -  Physician Assistants and Nurse Practitioners) who all work together to provide you with the care you need, when you need it.  We recommend signing up for the patient portal called "MyChart".  Sign up information is provided on this After Visit Summary.  MyChart is used to connect with patients for Virtual Visits (Telemedicine).  Patients are able to view lab/test results, encounter notes, upcoming appointments, etc.  Non-urgent messages can be sent to your provider as well.   To learn more about what you can do with MyChart, go to [email or web address].    Your next appointment:   6 month(s)  The format for your next appointment:   In Person  Provider:   Buford Dresser, MD     Signed, Kristin Dresser, MD PhD 04/26/2020   [place]

## 2020-04-27 LAB — HEPATIC FUNCTION PANEL
ALT: 12 IU/L (ref 0–32)
AST: 18 IU/L (ref 0–40)
Albumin: 4.6 g/dL (ref 3.7–4.7)
Alkaline Phosphatase: 80 IU/L (ref 39–117)
Bilirubin Total: 0.5 mg/dL (ref 0.0–1.2)
Bilirubin, Direct: 0.19 mg/dL (ref 0.00–0.40)
Total Protein: 6.8 g/dL (ref 6.0–8.5)

## 2020-04-27 LAB — LIPID PANEL
Chol/HDL Ratio: 2.2 ratio (ref 0.0–4.4)
Cholesterol, Total: 131 mg/dL (ref 100–199)
HDL: 59 mg/dL (ref >39)
LDL Chol Calc (NIH): 54 mg/dL (ref 0–99)
Triglycerides: 94 mg/dL (ref 0–149)
VLDL Cholesterol Cal: 18 mg/dL (ref 5–40)

## 2020-05-19 NOTE — Telephone Encounter (Signed)
Attempted to contact pt in regards to [employer]. Left message to call back.

## 2020-05-20 ENCOUNTER — Telehealth: Payer: Self-pay | Admitting: Cardiology

## 2020-05-20 NOTE — Telephone Encounter (Signed)
Call addressed in previous encounter

## 2020-05-20 NOTE — Telephone Encounter (Signed)
Call pt and left a detailed message stating to disregard last call as mychart message has already been addressed.

## 2020-05-20 NOTE — Telephone Encounter (Signed)
Patient states she is returning a call from 05/19/20. However, she states she does not know who called or what the call may have been in regards to. No current notes available.

## 2020-09-02 ENCOUNTER — Encounter: Payer: Self-pay | Admitting: Podiatry

## 2020-09-02 ENCOUNTER — Ambulatory Visit (INDEPENDENT_AMBULATORY_CARE_PROVIDER_SITE_OTHER): Payer: Medicare Other

## 2020-09-02 ENCOUNTER — Other Ambulatory Visit: Payer: Self-pay

## 2020-09-02 ENCOUNTER — Other Ambulatory Visit: Payer: Self-pay | Admitting: Podiatry

## 2020-09-02 ENCOUNTER — Ambulatory Visit (INDEPENDENT_AMBULATORY_CARE_PROVIDER_SITE_OTHER): Payer: Medicare Other | Admitting: Podiatry

## 2020-09-02 DIAGNOSIS — M2041 Other hammer toe(s) (acquired), right foot: Secondary | ICD-10-CM

## 2020-09-02 DIAGNOSIS — R2681 Unsteadiness on feet: Secondary | ICD-10-CM | POA: Diagnosis not present

## 2020-09-02 DIAGNOSIS — M21619 Bunion of unspecified foot: Secondary | ICD-10-CM | POA: Diagnosis not present

## 2020-09-02 DIAGNOSIS — M76821 Posterior tibial tendinitis, right leg: Secondary | ICD-10-CM

## 2020-09-02 DIAGNOSIS — M2040 Other hammer toe(s) (acquired), unspecified foot: Secondary | ICD-10-CM | POA: Diagnosis not present

## 2020-09-02 DIAGNOSIS — I2510 Atherosclerotic heart disease of native coronary artery without angina pectoris: Secondary | ICD-10-CM | POA: Diagnosis not present

## 2020-09-02 DIAGNOSIS — M2042 Other hammer toe(s) (acquired), left foot: Secondary | ICD-10-CM

## 2020-09-02 NOTE — Patient Instructions (Signed)
Hammer Toe  Hammer toe is a change in the shape (a deformity) of your toe. The deformity causes the middle joint of your toe to stay bent. This causes pain, especially when you are wearing shoes. Hammer toe starts gradually. At first, the toe can be straightened. Gradually over time, the deformity becomes stiff and permanent. Early treatments to keep the toe straight may relieve pain. As the deformity becomes stiff and permanent, surgery may be needed to straighten the toe. What are the causes? Hammer toe is caused by abnormal bending of the toe joint that is closest to your foot. It happens gradually over time. This pulls on the muscles and connections (tendons) of the toe joint, making them weak and stiff. It is often related to wearing shoes that are too short or narrow and do not let your toes straighten. What increases the risk? You may be at greater risk for hammer toe if you:  Are female.  Are older.  Wear shoes that are too small.  Wear high-heeled shoes that pinch your toes.  Are a Engineer, mining.  Have a second toe that is longer than your big toe (first toe).  Injure your foot or toe.  Have arthritis.  Have a family history of hammer toe.  Have a nerve or muscle disorder. What are the signs or symptoms? The main symptoms of this condition are pain and deformity of the toe. The pain is worse when wearing shoes, walking, or running. Other symptoms may include:  Corns or calluses over the bent part of the toe or between the toes.  Redness and a burning feeling on the toe.  An open sore that forms on the top of the toe.  Not being able to straighten the toe. How is this diagnosed? This condition is diagnosed based on your symptoms and a physical exam. During the exam, your health care provider will try to straighten your toe to see how stiff the deformity is. You may also have tests, such as:  A blood test to check for rheumatoid arthritis.  An X-ray to show how  severe the deformity is. How is this treated? Treatment for this condition will depend on how stiff the deformity is. Surgery is often needed. However, sometimes a hammer toe can be straightened without surgery. Treatments that do not involve surgery include:  Taping the toe into a straightened position.  Using pads and cushions to protect the toe (orthotics).  Wearing shoes that provide enough room for the toes.  Doing toe-stretching exercises at home.  Taking an NSAID to reduce pain and swelling. If these treatments do not help or the toe cannot be straightened, surgery is the next option. The most common surgeries used to straighten a hammer toe include:  Arthroplasty. In this procedure, part of the joint is removed, and that allows the toe to straighten.  Fusion. In this procedure, cartilage between the two bones of the joint is taken out and the bones are fused together into one longer bone.  Implantation. In this procedure, part of the bone is removed and replaced with an implant to let the toe move again.  Flexor tendon transfer. In this procedure, the tendons that curl the toes down (flexor tendons) are repositioned. Follow these instructions at home:  Take over-the-counter and prescription medicines only as told by your health care provider.  Do toe straightening and stretching exercises as told by your health care provider.  Keep all follow-up visits as told by your health care  provider. This is important. How is this prevented?  Wear shoes that give your toes enough room and do not cause pain.  Do not wear high-heeled shoes. Contact a health care provider if:  Your pain gets worse.  Your toe becomes red or swollen.  You develop an open sore on your toe. This information is not intended to replace advice given to you by your health care provider. Make sure you discuss any questions you have with your health care provider. Document Revised: 11/16/2017 Document  Reviewed: 03/29/2016 Elsevier Patient Education  [place]  A bunion is a bump on the base of the big toe that forms when the bones of the big toe joint move out of position. Bunions may be small at first, but they often get larger over time. They can make walking painful. What are the causes? A bunion may be caused by:  Wearing narrow or pointed shoes that force the big toe to press against the other toes.  Abnormal foot development that causes the foot to roll inward (pronate).  Changes in the foot that are caused by certain diseases, such as rheumatoid arthritis or polio.  A foot injury. What increases the risk? The following factors may make you more likely to develop this condition:  Wearing shoes that squeeze the toes together.  Having certain diseases, such as: ? Rheumatoid arthritis. ? Polio. ? Cerebral palsy.  Having family members who have bunions.  Being born with a foot deformity, such as flat feet or low arches.  Doing activities that put a lot of pressure on the feet, such as ballet dancing. What are the signs or symptoms? The main symptom of a bunion is a noticeable bump on the big toe. Other symptoms may include:  Pain.  Swelling around the big toe.  Redness and inflammation.  Thick or hardened skin on the big toe or between the toes.  Stiffness or loss of motion in the big toe.  Trouble with walking. How is this diagnosed? A bunion may be diagnosed based on your symptoms, medical history, and activities. You may have tests, such as:  X-rays. These allow your health care provider to check the position of the bones in your foot and look for damage to your joint. They also help your health care provider determine the severity of your bunion and the best way to treat it.  Joint aspiration. In this test, a sample of fluid is removed from the toe joint. This test may be done if you are in a lot of pain. It helps rule out diseases that cause  painful swelling of the joints, such as arthritis. How is this treated? Treatment depends on the severity of your symptoms. The goal of treatment is to relieve symptoms and prevent the bunion from getting worse. Your health care provider may recommend:  Wearing shoes that have a wide toe box.  Using bunion pads to cushion the affected area.  Taping your toes together to keep them in a normal position.  Placing a device inside your shoe (orthotics) to help reduce pressure on your toe joint.  Taking medicine to ease pain, inflammation, and swelling.  Applying heat or ice to the affected area.  Doing stretching exercises.  Surgery to remove scar tissue and move the toes back into their normal position. This treatment is rare. Follow these instructions at home: Managing pain, stiffness, and swelling   If directed, put ice on the painful area: ? Put ice in a  plastic bag. ? Place a towel between your skin and the bag. ? Leave the ice on for 20 minutes, 2-3 times a day. Activity   If directed, apply heat to the affected area before you exercise. Use the heat source that your health care provider recommends, such as a moist heat pack or a heating pad. ? Place a towel between your skin and the heat source. ? Leave the heat on for 20-30 minutes. ? Remove the heat if your skin turns bright red. This is especially important if you are unable to feel pain, heat, or cold. You may have a greater risk of getting burned.  Do exercises as told by your health care provider. General instructions  Support your toe joint with proper footwear, shoe padding, or taping as told by your health care provider.  Take over-the-counter and prescription medicines only as told by your health care provider.  Keep all follow-up visits as told by your health care provider. This is important. Contact a health care provider if your symptoms:  Get worse.  Do not improve in 2 weeks. Get help right away if you  have:  Severe pain and trouble with walking. Summary  A bunion is a bump on the base of the big toe that forms when the bones of the big toe joint move out of position.  Bunions can make walking painful.  Treatment depends on the severity of your symptoms.  Support your toe joint with proper footwear, shoe padding, or taping as told by your health care provider. This information is not intended to replace advice given to you by your health care provider. Make sure you discuss any questions you have with your health care provider. Document Revised: 06/10/2018 Document Reviewed: 04/16/2018 Elsevier Patient Education  [place].

## 2020-09-07 NOTE — Progress Notes (Signed)
Subjective:   Patient ID: Kristin Roach, female   DOB: 79 y.o.   MRN: [id number]   HPI Patient presents stating that she is having a lot of pain in her right ankle and that its been swollen and that also she has bunion deformity and digital deformity bilateral. Patient does not smoke likes to be active and states it is been present for several months   Review of Systems  All other systems reviewed and are negative.       Objective:  Physical Exam Vitals and nursing note reviewed.  Constitutional:      Appearance: She is well-developed.  Pulmonary:     Effort: Pulmonary effort is normal.  Musculoskeletal:        General: Normal range of motion.  Skin:    General: Skin is warm.  Neurological:     Mental Status: She is alert.     Neurovascular status was found to be intact muscle strength was found to be adequate range of motion was found to be within normal limits with moderate eversion of the subtalar joint. Patient is noted to have significant structural deformity with large bunion deformity right over left and digital deformity with overlapping toes rigid contracture. Patient has a lot of discomfort in the inside of the right ankle posterior tibial tendon as it comes under the medial malleolus but no indications of tendon dysfunction at the current time and was found to have good digital perfusion well oriented x3     Assessment:  Acute posterior tibial tendinitis right with flatfoot deformity is complicating factor along with structural bunion deformity digital deformity     Plan:  H&P reviewed condition. At this point I did do a sterile prep and then injected the posterior tibial tendon as it comes under the medial malleolus 3 mg dexamethasone Kenalog 5 mg Xylocaine and applied fascial brace to lift up the arch. Gave instructions for physical therapy anti-inflammatories discussed bunion hammertoes and recommended wider shoes with mesh material and currently do not recommend  surgery  X-rays indicate that there is deformity of the first MPJ right over left with a large elevation of the intermetatarsal angle deviation of the hallux against the second toe with rigid contracture lesser digits

## 2020-09-14 ENCOUNTER — Telehealth: Payer: Self-pay | Admitting: Cardiology

## 2020-09-14 NOTE — Telephone Encounter (Signed)
Kristin Roach is calling stating she is returning a phone call, but I was unable to find documentation as to what it was in regards to. Please advise.

## 2020-09-14 NOTE — Telephone Encounter (Signed)
Made attempt to reach patient but no answer. Left message to call to advise that no messages found in regards to a call however, patient is due for f/u visit in November 2021 which can be scheduled.

## 2020-09-15 ENCOUNTER — Telehealth: Payer: Self-pay | Admitting: Cardiology

## 2020-09-15 NOTE — Telephone Encounter (Signed)
I spoke with patient. She reports she has been breaking out in a sweat for the last 2-3 weeks.  Usually happens in the morning.  Not always related to activity. Happened once going up stairs, another time while driving and another time when sitting on the couch.  She did not have chest pain with this. She was concerned with elevated heart rate last night. Today her BP is 131/78 and heart rate is 80.  Started weaning Wellbutrin yesterday. Patient reports she started having back pain this morning. Not severe.  Reports this is her angina pain. She took one NTG earlier and pain eased some but has not gone away.  I went over NTG use with patient.  I advised her to take another NTG now.  I told her if pain did not improve after 3rd NTG dose she should call 911.

## 2020-09-15 NOTE — Telephone Encounter (Signed)
I called back to check on patient. She reports pain has gone away.  Did not need to take additional NTG.  She will let us know if she has more episodes. I told her to continue to monitor BP and pulse readings and she could send in readings in a week or so for Dr Harrell Gave to review.

## 2020-09-15 NOTE — Telephone Encounter (Signed)
Pt c/o BP issue: STAT if pt c/o blurred vision, one-sided weakness or slurred speech  1. What are your last 5 BP readings?  Last night was 154/70 with HR  101 This morning 152/79  With HR  86   2. Are you having any other symptoms (ex. Dizziness, headache, blurred vision, passed out)? For the pass few weeks she has the cold sweats to the point she has to change her clothes   3. What is your BP issue? Pt stated that her bp starting being high last night.  Pt stated that she has been tapering off the Wellbutrin and yesterday was the 1st day she had completely been with out     Best number  [phone]

## 2020-09-23 ENCOUNTER — Other Ambulatory Visit: Payer: Self-pay

## 2020-09-23 ENCOUNTER — Encounter: Payer: Self-pay | Admitting: Podiatry

## 2020-09-23 ENCOUNTER — Ambulatory Visit (INDEPENDENT_AMBULATORY_CARE_PROVIDER_SITE_OTHER): Payer: Medicare Other | Admitting: Podiatry

## 2020-09-23 DIAGNOSIS — M21619 Bunion of unspecified foot: Secondary | ICD-10-CM

## 2020-09-23 DIAGNOSIS — M76821 Posterior tibial tendinitis, right leg: Secondary | ICD-10-CM

## 2020-09-23 DIAGNOSIS — I2510 Atherosclerotic heart disease of native coronary artery without angina pectoris: Secondary | ICD-10-CM | POA: Diagnosis not present

## 2020-09-23 DIAGNOSIS — T148XXA Other injury of unspecified body region, initial encounter: Secondary | ICD-10-CM

## 2020-09-25 NOTE — Progress Notes (Signed)
Subjective:   Patient ID: Kristin Roach, female   DOB: 79 y.o.   MRN: [id number]   HPI Patient states she has had continued reoccurrence of pain in her right ankle and states also there is some swelling within the tendon   ROS      Objective:  Physical Exam  Neurovascular status intact with discomfort of the right posterior tibial tendon as it comes under the medial malleolus with inflammation fluid buildup     Assessment:  Posterior tibial tendinitis right with inflammation fluid buildup     Plan:  H&P and patient had sterile prep done and injected the tendon 3 mg Dexasone Kenalog 5 mg Xylocaine advised this patient on reduced activity and dispensed ankle compression stocking to lift up the arch.  Patient will be seen back to recheck

## 2020-10-01 IMAGING — MR MR LUMBAR SPINE WO/W CM
7 series · 48 of 48 positions shown · IV contrast (15 ml multihance)
Comparison: None.

CLINICAL DATA: Constant central and right-sided back pain radiating
to the right buttock since [REDACTED]. Prior back surgeries, most
recently in 8601.

EXAM:
MRI LUMBAR SPINE WITHOUT AND WITH CONTRAST
TECHNIQUE: Multiplanar and multiecho pulse sequences of the lumbar spine were
obtained without and with intravenous contrast.
CONTRAST:  15mL MULTIHANCE GADOBENATE DIMEGLUMINE 529 MG/ML IV SOLN
Creatinine was obtained on site at [HOSPITAL] at [HOSPITAL].
Results: Creatinine 0.6 mg/dL.

[Series 4: T1 · sagittal · 4.0mm · 0.88mm/px · 5 of 14 slices shown (1 of 2)]
[im 1/14]
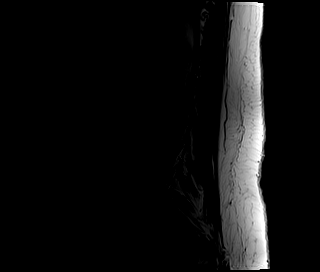
[im 4/14]
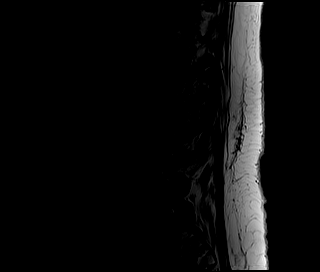
[im 7/14]
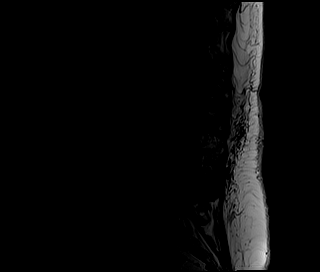
[im 10/14]
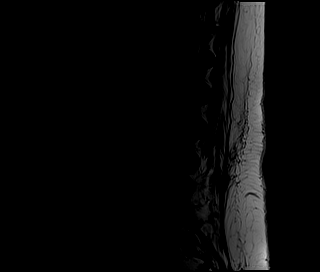
[im 14/14]
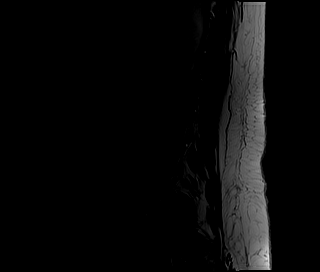

[Series 5: tirm sag · sagittal · 4.0mm · 0.55mm/px · 5 of 14 slices shown]
[im 1/14]
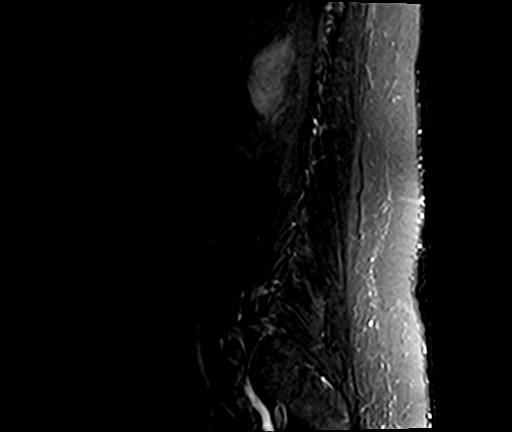
[im 4/14]
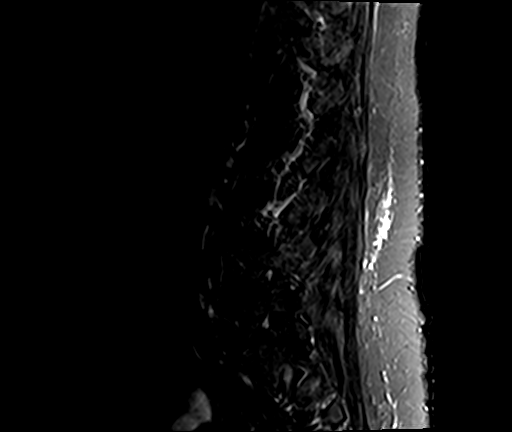
[im 7/14]
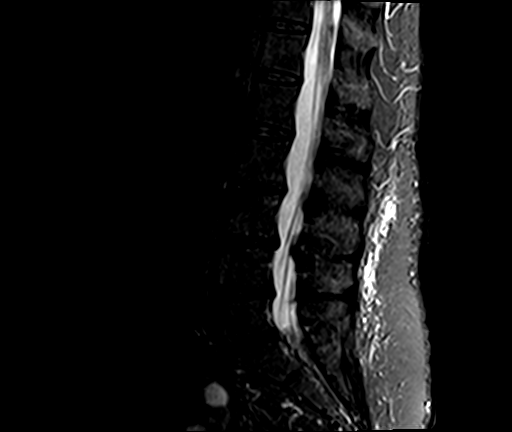
[im 10/14]
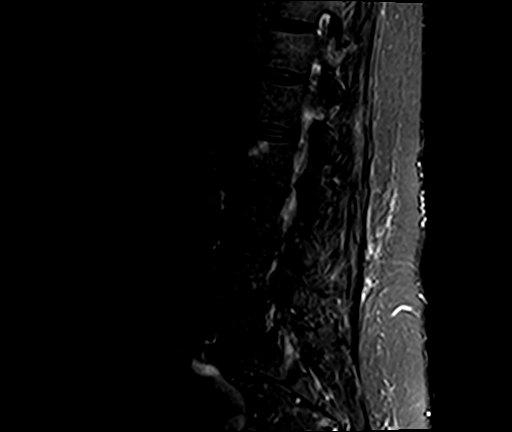
[im 14/14]
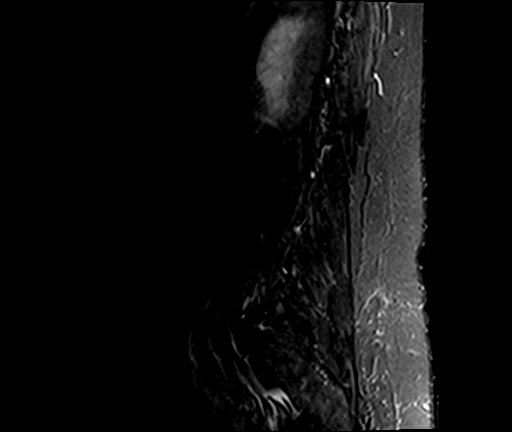

[Series 6: T1 · axial · 4.0mm · 0.78mm/px · z∈[-50,+149]mm · 10 of 35 slices shown (2 of 2)]
[im 1/35]
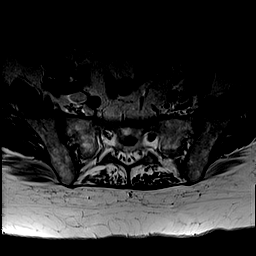
[im 4/35]
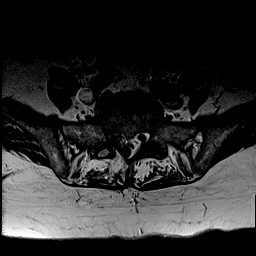
[im 8/35]
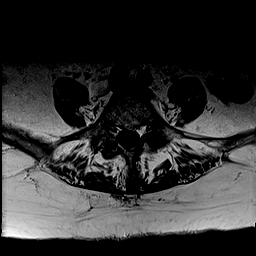
[im 12/35]
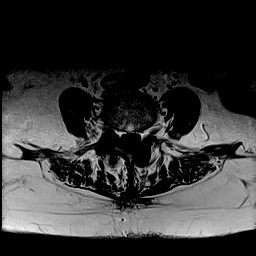
[im 16/35]
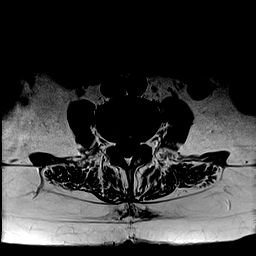
[im 19/35]
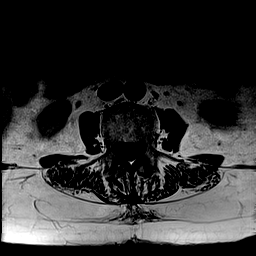
[im 23/35]
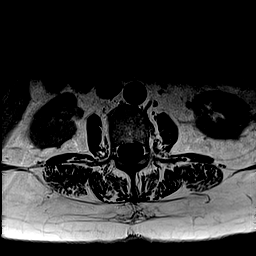
[im 27/35]
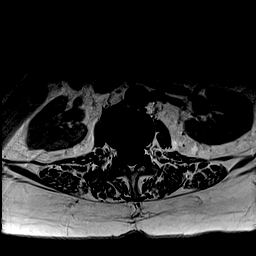
[im 31/35]
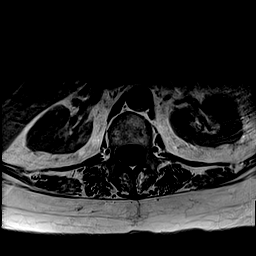
[im 35/35]
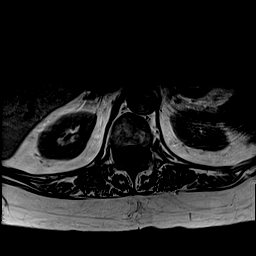

[Series 7: T2 · axial · 4.0mm · 0.78mm/px · z∈[-50,+149]mm · 10 of 35 slices shown]
[im 1/35]
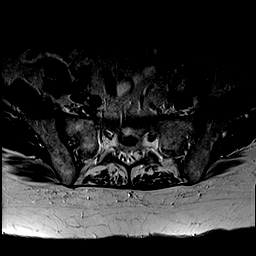
[im 4/35]
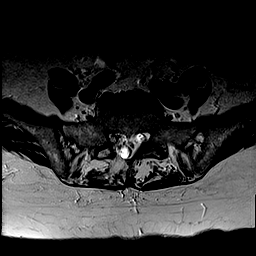
[im 8/35]
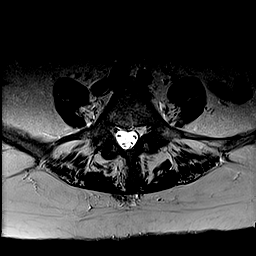
[im 12/35]
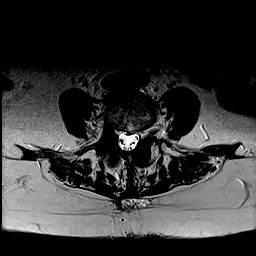
[im 16/35]
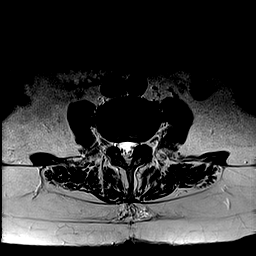
[im 19/35]
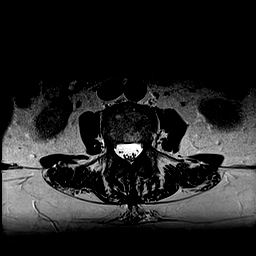
[im 23/35]
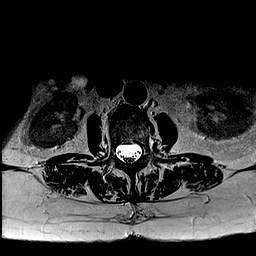
[im 27/35]
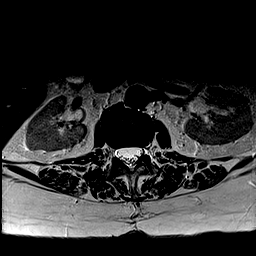
[im 31/35]
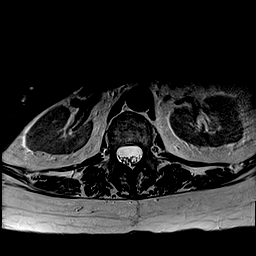
[im 35/35]
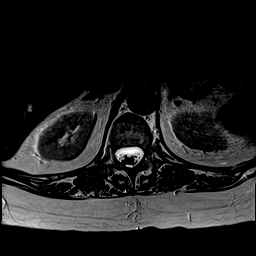

[Series 8: T2 post-contrast · sagittal · 4.0mm · 0.88mm/px · 4 of 14 slices shown]
[im 1/14]
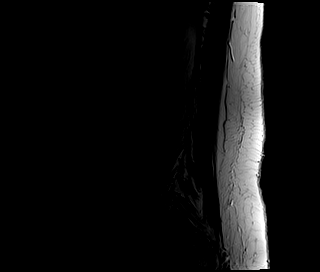
[im 5/14]
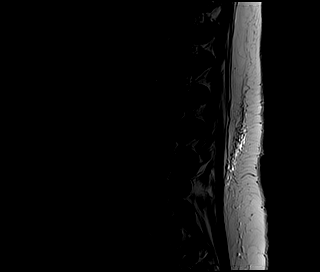
[im 9/14]
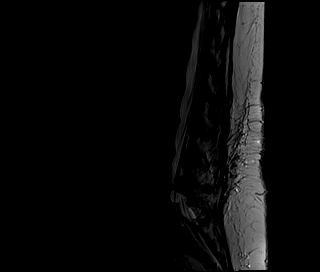
[im 14/14]
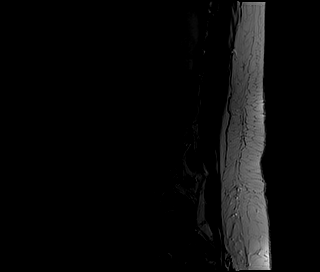

[Series 9: T1 fat-sat post-contrast · sagittal · 4.0mm · 0.88mm/px · 4 of 14 slices shown]
[im 1/14]
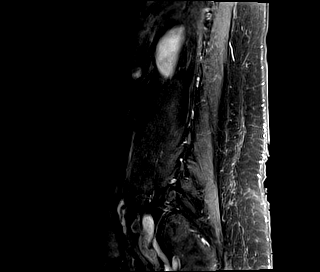
[im 5/14]
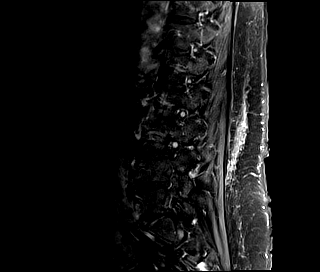
[im 9/14]
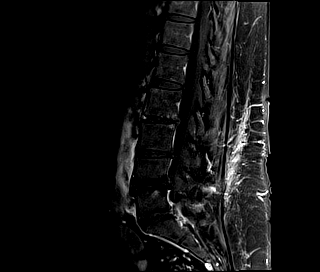
[im 14/14]
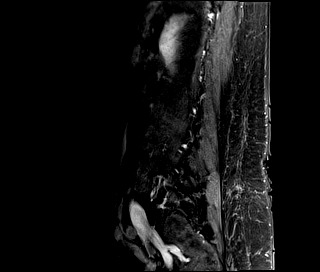

[Series 10: T1 post-contrast · axial · 4.0mm · 0.78mm/px · z∈[-50,+149]mm · 10 of 35 slices shown]
[im 1/35]
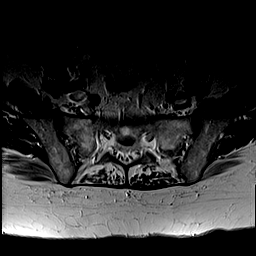
[im 4/35]
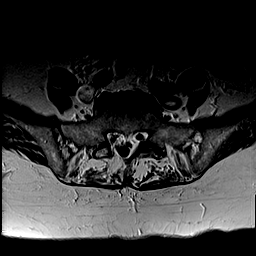
[im 8/35]
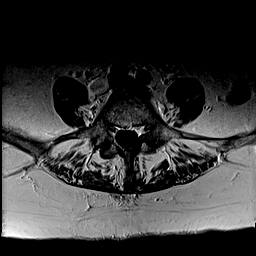
[im 12/35]
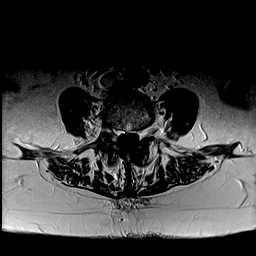
[im 16/35]
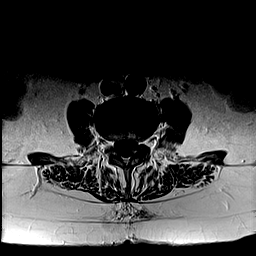
[im 19/35]
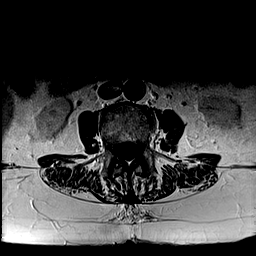
[im 23/35]
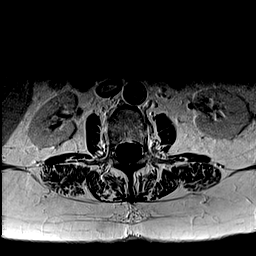
[im 27/35]
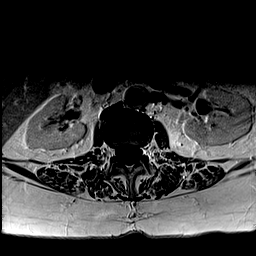
[im 31/35]
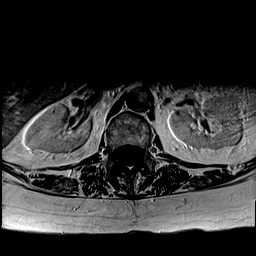
[im 35/35]
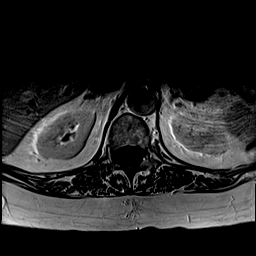

[48 of 48 positions shown; findings below may reference images not displayed]

FINDINGS: Segmentation: Assumed standard. The last well-formed disc space is
designated L5-S1 for the purposes of this report.

Alignment:  Physiologic.

Vertebrae: No fracture, evidence of discitis, or bone lesion. Focal
degenerative endplate marrow edema along the right anterior aspect
of the L2 superior endplate.

Conus medullaris and cauda equina: Conus extends to the L1 level.
Conus and cauda equina appear normal. No intradural enhancement.

Paraspinal and other soft tissues: Common bile duct and right
inferior intrahepatic biliary dilatation.

Disc levels:

T11-T12: Only seen on the sagittal images.  Negative.

T12-L1:  Negative.

L1-L2:  Tiny central disc protrusion.  No stenosis.

L2-L3: Disc height loss and mild disc bulge. Mild facet arthropathy.
No stenosis.

L3-L4: Disc height loss and mild disc bulge. Mild facet arthropathy.
No stenosis.

L4-L5: Diffuse disc bulge with right-sided disc osteophyte complex.
Mild bilateral facet arthropathy. Moderate right lateral recess
stenosis. Mild to moderate right neuroforaminal stenosis. No spinal
canal or left neuroforaminal stenosis.

L5-S1: Prior right hemilaminectomy. Mild disc bulging and
mild-to-moderate bilateral facet arthropathy. Mild right
neuroforaminal stenosis. No spinal canal or left neuroforaminal
stenosis.
IMPRESSION: 1. Multilevel degenerative changes of the lumbar spine as described
above, worst at L4-L5 where there is moderate right lateral recess
stenosis potentially affecting the descending right L5 nerve root
and mild to moderate right neuroforaminal stenosis.
2. Additional mild right neuroforaminal stenosis at L5-S1.
3. Common bile duct and right inferior intrahepatic biliary
dilatation, potentially related to post cholecystectomy state.
Correlate with LFTs. If abnormal, recommend ERCP or MRCP for further
evaluation.

## 2020-10-25 ENCOUNTER — Other Ambulatory Visit: Payer: Self-pay | Admitting: Orthopedic Surgery

## 2020-10-25 DIAGNOSIS — M545 Low back pain, unspecified: Secondary | ICD-10-CM

## 2020-10-28 ENCOUNTER — Ambulatory Visit: Payer: Medicare Other | Admitting: Podiatry

## 2020-11-01 ENCOUNTER — Ambulatory Visit (INDEPENDENT_AMBULATORY_CARE_PROVIDER_SITE_OTHER): Payer: Medicare Other | Admitting: Cardiology

## 2020-11-01 ENCOUNTER — Other Ambulatory Visit: Payer: Self-pay

## 2020-11-01 ENCOUNTER — Encounter: Payer: Self-pay | Admitting: Cardiology

## 2020-11-01 VITALS — BP 132/70 | HR 62 | Ht <= 58 in | Wt 169.6 lb

## 2020-11-01 DIAGNOSIS — I2510 Atherosclerotic heart disease of native coronary artery without angina pectoris: Secondary | ICD-10-CM | POA: Diagnosis not present

## 2020-11-01 DIAGNOSIS — R600 Localized edema: Secondary | ICD-10-CM

## 2020-11-01 DIAGNOSIS — R0989 Other specified symptoms and signs involving the circulatory and respiratory systems: Secondary | ICD-10-CM

## 2020-11-01 DIAGNOSIS — I5189 Other ill-defined heart diseases: Secondary | ICD-10-CM

## 2020-11-01 DIAGNOSIS — Z7189 Other specified counseling: Secondary | ICD-10-CM | POA: Diagnosis not present

## 2020-11-01 NOTE — Progress Notes (Signed)
Cardiology Office Note:    Date:  11/01/2020   ID:  [name], DOB Jun 19, 1941, MRN [id number]  PCP:  Jettie Booze, NP  Cardiologist:  Buford Dresser, MD PhD  Referring MD: Jettie Booze, NP   CC: follow up  History of Present Illness:    Kristin Roach is a 79 y.o. female with a hx of OSA, asthma, arthritis, Prinzmetal's angina. I initially met her 07/30/18 as a new consult.  Cardiac history: Saw Dr. Bayard Hugger in [place], [place] (Fax [phone]). Notes states: Heart murmur, mitral valve prolapse; sleep apnea, uses CPAP. Dr. Joaquim Lai at [facility] was her internal medicine physician. Thinks her last stress test was around 2012. Echo in 2017. Had prior heart caths in the early 1990s, did not require stents but does report that she had vasconstriction with ergotamine.  Admitted 09/07/19 for chest pain. Seen by Dr. Irish Lack in the hospital. Had CT coronary with nonobstructive disease.  Today: Doing well from a heart perspective. Has only required nitro twice in the last six months, both for very brief episodes that she could not distinguish whether it was her anxiety or her heart. Edema much improved. Breathing stable. Main issue has been multiple joint and nerve issues.  Blood pressure had been trending lower, now on no medications for BP. Asking about whether she should be on aspirin or not, discussed today.  Denies shortness of breath at rest or with normal exertion. No PND, orthopnea, LE edema or unexpected weight gain. No syncope or palpitations.  Past Medical History:  Diagnosis Date  . Angina at rest [facility])   . Arthritis   . Asthma    bronchial asthma  . Cataracts, bilateral   . Complication of anesthesia    woke up during back procedure  . HOH (hard of hearing)   . Pneumonia    hx of 2012  . PONV (postoperative nausea and vomiting)    with knee surgery in 11 2015  . RLS (restless legs syndrome)   . Sleep apnea    cpap    Past Surgical History:   Procedure Laterality Date  . ABDOMINAL HYSTERECTOMY    . BACK SURGERY     laminectomy  . BELPHAROPTOSIS REPAIR    . CARDIAC CATHETERIZATION    . CESAREAN SECTION    . CHOLECYSTECTOMY    . COLONOSCOPY  2014   sigmoid hyperplastic polyps  . HERNIA REPAIR    . SHOULDER SURGERY     removal of bone spur  . TONSILLECTOMY    . TOTAL KNEE ARTHROPLASTY Left 10/26/2014   Procedure: LEFT TOTAL KNEE ARTHROPLASTY;  Surgeon: Kerin Salen, MD;  Location: [place];  Service: Orthopedics;  Laterality: Left;    Current Medications: Current Outpatient Medications on File Prior to Visit  Medication Sig  . budesonide-formoterol (SYMBICORT) 80-4.5 MCG/ACT inhaler Inhale 2 puffs into the lungs as needed.   . citalopram (CELEXA) 10 MG tablet Take 10 mg by mouth daily.  . diazepam (VALIUM) 5 MG tablet Take 5 mg by mouth as needed for anxiety (when she takes nitroglycerin).   Marland Kitchen HYDROcodone-acetaminophen (NORCO) 7.5-325 MG tablet Take 1 tablet by mouth as needed for moderate pain. Pt takes 1 tablet by mouth 4(four) times a day as needed.  . nitroGLYCERIN (NITROSTAT) 0.4 MG SL tablet Place 1 tablet (0.4 mg total) under the tongue every 5 (five) minutes as needed for chest pain.  Marland Kitchen rOPINIRole (REQUIP) 1 MG tablet Take 1 mg by mouth at  bedtime.   . rosuvastatin (CRESTOR) 40 MG tablet Take 40 mg by mouth daily.   Marland Kitchen albuterol (VENTOLIN HFA) 108 (90 Base) MCG/ACT inhaler Inhale 2 puffs into the lungs every 6 (six) hours as needed for wheezing. (Patient not taking: Reported on 11/01/2020)  . aspirin 81 MG tablet Take 81 mg by mouth daily.   No current facility-administered medications on file prior to visit.     Allergies:   Ergonovine, Demerol [meperidine], Erythromycin, Flagyl [metronidazole], and Percocet [oxycodone-acetaminophen]   Social History   Tobacco Use  . Smoking status: Never Smoker  . Smokeless tobacco: Never Used  Vaping Use  . Vaping Use: Never used  Substance Use Topics  . Alcohol use: Not  Currently    Alcohol/week: 14.0 standard drinks    Types: 14 Glasses of wine per week  . Drug use: No   Family history: mother passed from MI in age 69, father died of leukemia at age 12. Two grandparents died from MI. Sister just died of lung cancer at age 70. No other siblings. Son and daughter both have terrible migraines. Daughter was a methamphetamine user, had been sober for 8 years but has some residual issues.   ROS:   Please see the history of present illness.  Additional pertinent ROS otherwise unremarkable.    EKGs/Labs/Other Studies Reviewed:    The following studies were reviewed today:  CT 2019/10/02 Aorta:  Normal size.  Mild diffuse calcifications.  No dissection.  Aortic Valve:  Trileaflet.  Mild calcifications.  Coronary Arteries:  Normal coronary origin.  Right dominance.  RCA is a large dominant artery that gives rise to PDA and PLA. There are only minimal irregularities.  Left main is a large artery that gives rise to LAD and LCX arteries.  LAD is a large vessel that has mild calcified plaque in the proximal and mid portion with stenosis 25-49%.  LCX is a large non-dominant artery that gives rise to one large OM1 branch. There are only minimal irregularities.  OM1 is a very large branch that has no plaque.  Other findings:  Normal pulmonary vein drainage into the left atrium.  Normal left atrial appendage without a thrombus.  IMPRESSION: 1. Coronary calcium score of 208. This was 78 percentile for age and sex matched control.  2. Normal coronary origin with right dominance.  3. CAD-RADS 2. Mild non-obstructive CAD (25-49%). Consider non-atherosclerotic causes of chest pain. Consider preventive therapy and risk factor modification.  4. Mildly dilated pulmonary artery measuring 32 mm.  Echo 09/08/19  1. Left ventricular ejection fraction, by visual estimation, is 40 to 45%. The left ventricle has normal function. Normal left  ventricular size. There is no left ventricular hypertrophy.  2. Left ventricular diastolic Doppler parameters are consistent with impaired relaxation pattern of LV diastolic filling.  3. Global right ventricle has normal systolic function.The right ventricular size is normal. No increase in right ventricular wall thickness.  4. Left atrial size was normal.  5. Right atrial size was normal.  6. The mitral valve is normal in structure. Mild mitral valve regurgitation. No evidence of mitral stenosis.  7. The tricuspid valve is normal in structure. Tricuspid valve regurgitation is trivial.  8. The aortic valve is normal in structure. Aortic valve regurgitation was not visualized by color flow Doppler. Structurally normal aortic valve, with no evidence of sclerosis or stenosis.  9. The pulmonic valve was normal in structure. Pulmonic valve regurgitation is not visualized by color flow Doppler. 10. Normal pulmonary  artery systolic pressure. 11. The inferior vena cava is normal in size with greater than 50% respiratory variability, suggesting right atrial pressure of 3 mmHg.   EKG:  EKG is personally reviewed today.  The ekg ordered today demonstrates normal sinus rhythm, LBBB at 62 bpm  Recent Labs: 04/18/2020: BUN 14; Creatinine, Ser 0.58; Hemoglobin 12.2; Platelets 260; Potassium 4.1; Sodium 141 04/26/2020: ALT 12  Recent Lipid Panel    Component Value Date/Time   CHOL 131 04/26/2020 1610   TRIG 94 04/26/2020 1610   HDL 59 04/26/2020 1610   CHOLHDL 2.2 04/26/2020 1610   CHOLHDL 4.7 09/09/2019 0329   VLDL 18 09/09/2019 0329   LDLCALC 54 04/26/2020 1610    Physical Exam:    VS:  BP 132/70 (BP Location: Left Arm, Patient Position: Sitting)   Pulse 62   Ht $R'4\' 10"'FE$  (1.473 m)   Wt 169 lb 9.6 oz (76.9 kg)   SpO2 95%   BMI 35.45 kg/m     Wt Readings from Last 3 Encounters:  11/01/20 169 lb 9.6 oz (76.9 kg)  04/26/20 169 lb (76.7 kg)  04/18/20 160 lb (72.6 kg)    GEN: Well nourished, well  developed in no acute distress HEENT: Normal, moist mucous membranes NECK: No JVD CARDIAC: regular rhythm, normal S1 and S2, no rubs or gallops. No murmur. VASCULAR: Radial and DP pulses 2+ bilaterally. No carotid bruits RESPIRATORY:  Clear to auscultation without rales, wheezing or rhonchi  ABDOMEN: Soft, non-tender, non-distended MUSCULOSKELETAL:  Ambulates independently SKIN: Warm and dry, no significant pitting edema, mild chronic nonpitting edema NEUROLOGIC:  Alert and oriented x 3. No focal neuro deficits noted. PSYCHIATRIC:  Normal affect   ASSESSMENT:    1. Systolic dysfunction without heart failure   2. Nonocclusive coronary atherosclerosis of native coronary artery   3. Bilateral lower extremity edema   4. Counseling on health promotion and disease prevention   5. Labile blood pressure    PLAN:    Nonobstructive CAD, history of Prinzmetal's angina, prior admission for chest pain:  -CT coronary with nonobstructive CAD -on aspirin 81 mg, discussed recommendations for this today -continue rosuvastatin 40 mg daily, tolerating -we have previously discussed recommended avoidance of NSAIDs in CAD -has SL NG, used rarely -counseled on red flag warning signs that need immediate medical attention -no longer on beta blocker due to low blood pressure  Systolic dysfunction without clinical heart failure -EF 40-45% -nonobstructive CAD on CT coronary -no longer on carvedilol due to hypotension, also limited ACEi/ARB/ARNI.   Labile BP: -has been variable. Has had more low numbers than high numbers -continue to monitor at home  Cardiovascular risk counseling and prevention: -recommend heart healthy/Mediterranean diet, with whole grains, fruits, vegetable, fish, lean meats, nuts, and olive oil. Limit salt. -recommend moderate walking, 3-5 times/week for 30-50 minutes each session. Aim for at least 150 minutes.week. Goal should be pace of 3 miles/hours, or walking 1.5 miles in 30  minutes -recommend avoidance of tobacco products. Avoid excess alcohol.  Plan for follow up: 6 mos  Medication Adjustments/Labs and Tests Ordered: Current medicines are reviewed at length with the patient today.  Concerns regarding medicines are outlined above.  Orders Placed This Encounter  Procedures  . EKG 12-Lead   No orders of the defined types were placed in this encounter.   Patient Instructions  Medication Instructions:  Your Physician recommend you continue on your current medication as directed.    *If you need a refill on your cardiac medications  before your next appointment, please call your pharmacy*   Lab Work: None   Testing/Procedures: None   Follow-Up: At [facility], you and your health needs are our priority.  As part of our continuing mission to provide you with exceptional heart care, we have created designated Provider Care Teams.  These Care Teams include your primary Cardiologist (physician) and Advanced Practice Providers (APPs -  Physician Assistants and Nurse Practitioners) who all work together to provide you with the care you need, when you need it.  We recommend signing up for the patient portal called "MyChart".  Sign up information is provided on this After Visit Summary.  MyChart is used to connect with patients for Virtual Visits (Telemedicine).  Patients are able to view lab/test results, encounter notes, upcoming appointments, etc.  Non-urgent messages can be sent to your provider as well.   To learn more about what you can do with MyChart, go to [email or web address].    Your next appointment:   6 month(s)  The format for your next appointment:   In Person  Provider:   Buford Dresser, MD     Signed, Buford Dresser, MD PhD 11/01/2020   [place]

## 2020-11-01 NOTE — Patient Instructions (Signed)

## 2020-11-21 ENCOUNTER — Ambulatory Visit
Admission: RE | Admit: 2020-11-21 | Discharge: 2020-11-21 | Disposition: A | Discharge status: Home / Self Care | Payer: Medicare Other | Source: Ambulatory Visit | Attending: Orthopedic Surgery | Admitting: Orthopedic Surgery

## 2020-11-21 DIAGNOSIS — M545 Low back pain, unspecified: Secondary | ICD-10-CM

## 2020-12-13 ENCOUNTER — Other Ambulatory Visit: Payer: Self-pay | Admitting: Orthopedic Surgery

## 2020-12-13 DIAGNOSIS — G8929 Other chronic pain: Secondary | ICD-10-CM

## 2020-12-31 ENCOUNTER — Other Ambulatory Visit: Payer: Self-pay

## 2020-12-31 ENCOUNTER — Ambulatory Visit
Admission: RE | Admit: 2020-12-31 | Discharge: 2020-12-31 | Disposition: A | Discharge status: Home / Self Care | Payer: Medicare Other | Source: Ambulatory Visit | Attending: Orthopedic Surgery | Admitting: Orthopedic Surgery

## 2020-12-31 DIAGNOSIS — G8929 Other chronic pain: Secondary | ICD-10-CM

## 2020-12-31 DIAGNOSIS — M533 Sacrococcygeal disorders, not elsewhere classified: Secondary | ICD-10-CM

## 2021-02-02 ENCOUNTER — Other Ambulatory Visit: Payer: Self-pay | Admitting: *Deleted

## 2021-02-02 MED ORDER — ROSUVASTATIN CALCIUM 40 MG PO TABS: 40.0000 mg | ORAL_TABLET | Freq: Every day | ORAL | 0 refills | Status: DC

## 2021-02-07 ENCOUNTER — Telehealth: Payer: Self-pay | Admitting: Internal Medicine

## 2021-02-07 NOTE — Telephone Encounter (Signed)
Patient called states she is having issues with BM and is seeking advise.

## 2021-02-07 NOTE — Telephone Encounter (Signed)
Patient reports that she has been having chest pains and reflux.  She reports that she was having dark stools as well.  She does report Pepto bismol use this week to control the reflux.  She has been taking OTC zantac with little relief.  She will come in tomorrow and see Carl Best, RNP.  Patient is concerned as she has angina as well and not sure if this is her angina or reflux.

## 2021-02-07 NOTE — Telephone Encounter (Signed)
Left message for patient to call back  

## 2021-02-08 ENCOUNTER — Encounter: Payer: Self-pay | Admitting: Nurse Practitioner

## 2021-02-08 ENCOUNTER — Ambulatory Visit (INDEPENDENT_AMBULATORY_CARE_PROVIDER_SITE_OTHER): Payer: Medicare Other | Admitting: Nurse Practitioner

## 2021-02-08 ENCOUNTER — Other Ambulatory Visit (INDEPENDENT_AMBULATORY_CARE_PROVIDER_SITE_OTHER): Payer: Medicare Other

## 2021-02-08 VITALS — BP 128/60 | HR 76 | Ht <= 58 in | Wt 167.8 lb

## 2021-02-08 DIAGNOSIS — R072 Precordial pain: Secondary | ICD-10-CM | POA: Diagnosis not present

## 2021-02-08 DIAGNOSIS — R10814 Left lower quadrant abdominal tenderness: Secondary | ICD-10-CM

## 2021-02-08 DIAGNOSIS — R10812 Left upper quadrant abdominal tenderness: Secondary | ICD-10-CM | POA: Diagnosis not present

## 2021-02-08 DIAGNOSIS — R1013 Epigastric pain: Secondary | ICD-10-CM

## 2021-02-08 LAB — COMPREHENSIVE METABOLIC PANEL
ALT: 29 U/L (ref 0–35)
AST: 17 U/L (ref 0–37)
Albumin: 4.2 g/dL (ref 3.5–5.2)
Alkaline Phosphatase: 70 U/L (ref 39–117)
BUN: 27 mg/dL — ABNORMAL HIGH (ref 6–23)
CO2: 31 mEq/L (ref 19–32)
Calcium: 9.5 mg/dL (ref 8.4–10.5)
Chloride: 101 mEq/L (ref 96–112)
Creatinine, Ser: 0.62 mg/dL (ref 0.40–1.20)
GFR: 84.43 mL/min (ref >60.00)
Glucose, Bld: 112 mg/dL — ABNORMAL HIGH (ref 70–99)
Potassium: 4.3 mEq/L (ref 3.5–5.1)
Sodium: 139 mEq/L (ref 135–145)
Total Bilirubin: 0.5 mg/dL (ref 0.2–1.2)
Total Protein: 6.9 g/dL (ref 6.0–8.3)

## 2021-02-08 LAB — CBC WITH DIFFERENTIAL/PLATELET
Basophils Absolute: 0.1 10*3/uL (ref 0.0–0.1)
Basophils Relative: 0.7 % (ref 0.0–3.0)
Eosinophils Absolute: 0.1 10*3/uL (ref 0.0–0.7)
Eosinophils Relative: 0.7 % (ref 0.0–5.0)
HCT: 41 % (ref 36.0–46.0)
Hemoglobin: 13.5 g/dL (ref 12.0–15.0)
Lymphocytes Relative: 19.3 % (ref 12.0–46.0)
Lymphs Abs: 3.1 10*3/uL (ref 0.7–4.0)
MCHC: 32.9 g/dL (ref 30.0–36.0)
MCV: 86.9 fl (ref 78.0–100.0)
Monocytes Absolute: 0.9 10*3/uL (ref 0.1–1.0)
Monocytes Relative: 5.5 % (ref 3.0–12.0)
Neutro Abs: 11.9 10*3/uL — ABNORMAL HIGH (ref 1.4–7.7)
Neutrophils Relative %: 73.8 % (ref 43.0–77.0)
Platelets: 262 10*3/uL (ref 150.0–400.0)
RBC: 4.72 Mil/uL (ref 3.87–5.11)
RDW: 14.5 % (ref 11.5–15.5)
WBC: 16.1 10*3/uL — ABNORMAL HIGH (ref 4.0–10.5)

## 2021-02-08 LAB — LIPASE: Lipase: 65 U/L — ABNORMAL HIGH (ref 11.0–59.0)

## 2021-02-08 MED ORDER — PANTOPRAZOLE SODIUM 40 MG PO TBEC: 40.0000 mg | DELAYED_RELEASE_TABLET | Freq: Every day | ORAL | 1 refills | Status: DC

## 2021-02-08 NOTE — Progress Notes (Signed)
02/08/2021 Kristin WRAGG [id number] October 12, 1941   Chief Complaint: Chest pain   History of Present Illness: Kristin Roach is a 80 year old female with a past medical history of arthritis, asthma, sleep apnea uses cpap, Prinzmetal's angina, MVP, GERD and colon polyps. Past cholecystectomy secondary to gallstones around 2012, hysterectomy, laminectomy, hernia repair, shoulder surgery, left total knee replacemt surgery, tonsillectomy and C section. She was initially seen in our office by Dr. Carlean Purl on 02/04/2020 for further evaluation for left sided abdominal/flank pain. 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 Her left flank eventually resolved.   She presents to our office today for further evaluation regarding chest pain which she associates with having acid reflux and angina. One week ago, she developed mid chest pain which radiated up into her left chest and into her left shoulder. No associated palpitations, dizziness or SOB. No N/V. She initially thought this was her typical Prinzmetal's angina so she took NGT SL Q 5 minutes x 3 and Diazepam and her chest pain diminished. However, within a few hours her chest pain recurred and she thought this was most likely due to her acid reflux. No classic heartburn. No dysphagia. She took Zantac bid and Pepto bismol for a few days and her chest pain abated. No CP at this time. She noted passing a black solid stool for 1 or 2 days after she took Pepto. No further black stools since then. She typically passes a normal formed brown BM daily. No rectal bleeding. She takes ASA 81mg  daily. No other NSAID use. She underwent an EGD in 2013 or 2015 while living in [place] without significant findings per her report. She has a history of colon polyps. She reports  completing 3 or 4 colonoscopies in her lifetime. She stated 9 polyps were removed from her colon during her first colonoscopy. Her most recent colonoscopy was 11/2013 and a few hyperplastic polyps were removed from the sigmoid colon. Father with history of colon polyps. No family history of colon cancer.    CBC Latest Ref Rng & Units 04/18/2020 09/09/2019 09/08/2019  WBC 4.0 - 10.5 K/uL 10.4 9.4 9.0  Hemoglobin 12.0 - 15.0 g/dL 12.2 12.2 12.5  Hematocrit 36.0 - 46.0 % 39.8 36.2 37.9  Platelets 150 - 400 K/uL 260 235 259   CMP Latest Ref Rng & Units 04/26/2020 04/18/2020 02/04/2020  Glucose 70 - 99 mg/dL - 100(H) -  BUN 8 - 23 mg/dL - 14 15  Creatinine 0.44 - 1.00 mg/dL - 0.58 0.66  Sodium 135 - 145 mmol/L - 141 -  Potassium 3.5 - 5.1 mmol/L - 4.1 -  Chloride 98 - 111 mmol/L - 103 -  CO2 22 - 32 mmol/L - 29 -  Calcium 8.9 - 10.3 mg/dL - 9.2 -  Total Protein 6.0 - 8.5 g/dL 6.8 7.2 -  Total Bilirubin 0.0 - 1.2 mg/dL 0.5 0.3 -  Alkaline Phos 39 - 117 IU/L 80 73 -  AST 0 - 40 IU/L 18 23 -  ALT 0 - 32 IU/L 12 21 -    CTAP with contrast 04/18/2020:  1.  No acute findings in the abdomen/pelvis.  No acute fracture. 2. Previous cholecystectomy with stable chronic dilatation of the intrahepatic ducts and common bile duct. 3. Stable subtle free fluid over the mesentery  of the mid abdomen. Findings could be seen with mild chronic mesenteric panniculitis. 4.  Colonic diverticulosis. 5.  Aortic Atherosclerosis   Last colonoscopy 2014 in [place]: sigmoid hyperplastic polyps - taken from path report do not have procedure report  Coronary CT 9/22/20220: 1. Coronary calcium score of 208. This was 16 percentile for age and sex matched control. 2. Normal coronary origin with right dominance. 3. CAD-RADS 2. Mild non-obstructive CAD (25-49%). Consider non-atherosclerotic causes of chest pain. Consider preventive therapy and risk factor modification. 4. Mildly dilated pulmonary artery measuring 32  mm.  Echo 09/08/2019: 1. Left ventricular ejection fraction, by visual estimation, is 40 to 45%. The left ventricle has normal function. Normal left ventricular size. There is no left ventricular hypertrophy. 2. Left ventricular diastolic Doppler parameters are consistent with impaired relaxation pattern of LV diastolic filling. 3. Global right ventricle has normal systolic function.The right ventricular size is normal. No increase in right ventricular wall thickness. 4. Left atrial size was normal. 5. Right atrial size was normal. 6. The mitral valve is normal in structure. Mild mitral valve regurgitation. No evidence of mitral stenosis. 7. The tricuspid valve is normal in structure. Tricuspid valve regurgitation is trivial. 8. The aortic valve is normal in structure. Aortic valve regurgitation was not visualized by color flow Doppler. Structurally normal aortic valve, with no evidence of sclerosis or stenosis. 9. The pulmonic valve was normal in structure. Pulmonic valve regurgitation is not visualized by color flow Doppler. 10. Normal pulmonary artery systolic pressure. 11. The inferior vena cava is normal in size with greater than 50% respiratory variability, suggesting right atrial pressure of 3 mmHg.   Past Medical History:  Diagnosis Date  . Angina at rest [facility])   . Arthritis   . Asthma    bronchial asthma  . Cataracts, bilateral   . Complication of anesthesia    woke up during back procedure  . HOH (hard of hearing)   . Pneumonia    hx of 2012  . PONV (postoperative nausea and vomiting)    with knee surgery in 11 2015  . RLS (restless legs syndrome)   . Sleep apnea    cpap   Past Surgical History:  Procedure Laterality Date  . ABDOMINAL HYSTERECTOMY    . BACK SURGERY     laminectomy  . BELPHAROPTOSIS REPAIR    . CARDIAC CATHETERIZATION    . CESAREAN SECTION    . CHOLECYSTECTOMY    . COLONOSCOPY  2014   sigmoid hyperplastic polyps  . HERNIA REPAIR    .  SHOULDER SURGERY     removal of bone spur  . TONSILLECTOMY    . TOTAL KNEE ARTHROPLASTY Left 10/26/2014   Procedure: LEFT TOTAL KNEE ARTHROPLASTY;  Surgeon: Kerin Salen, MD;  Location: [place];  Service: Orthopedics;  Laterality: Left;     Current Medications, Allergies, Past Medical History, Past Surgical History, Family History and Social History were reviewed in [employer] record.   Review of Systems:   Constitutional: Negative for fever, sweats, chills or weight loss.  Respiratory: Negative for shortness of breath.   Cardiovascular: See HP.  Gastrointestinal: See HPI.  Musculoskeletal: Negative for back pain or muscle aches.  Neurological: Negative for dizziness, headaches or paresthesias.   Physical Exam: BP 128/60   Pulse 76   Ht 4\' 10"  (1.473 m)   Wt 167 lb 12.8 oz (76.1 kg)   BMI 35.07 kg/m   General: 80 year old female in no acute  distress. Head: Normocephalic and atraumatic. Eyes: No scleral icterus. Conjunctiva pink . Ears: Normal auditory acuity. Mouth: Dentition intact. No ulcers or lesions.  Lungs: Clear throughout to auscultation. Heart: Regular rate and rhythm, no murmur. Abdomen: Soft, nontender and nondistended. No masses or hepatomegaly. Normal bowel sounds x 4 quadrants.  Rectal: Deferred. Musculoskeletal: Symmetrical with no gross deformities. Extremities: No edema. Neurological: Alert oriented x 4. No focal deficits.  Psychological: Alert and cooperative. Normal mood and affect  Assessment and Recommendations: 83. 80 year old female with nonobstructive CAD and Prinzmental's angina presents with mid sternal chest pain which radiates to the left chest and left shoulder. Esophageal vs cardiac etiology. Unlikely biliary.  -CBC, CMP, Lipase -Barium swallow with tablet to assess for esophageal pathology/esphageal spasms -Eventual EGD. Cardiac clearance by Dr. Buford Dresser required prior to scheduling EGD -Pantoprazole 40mg   QD  2. Systolic dysfunction without heart failure. LV EF 40 - 45%  3. History of colon polyps. Last colonoscopy in 2014 identified a few colon polyps removed from the sigmoid colon. Patient reported having 9 colon polyps removed at the time of her initial screening colonoscopy. Father with history of colon polyps. No family history of colon cancer.  -Request 2014  colonoscopy procedure report  -No plans for a repeat colonoscopy at this time, patient is 80 years old. Defer decision regarding any further polyp surveillance colonoscopies to Dr. Carlean Purl

## 2021-02-08 NOTE — Patient Instructions (Addendum)
You have been scheduled for a Barium Esophogram at [facility] (1st floor of the hospital) on 02/14/21 at 11:00. Please arrive 15 minutes prior to your appointment for registration. Make certain not to have anything to eat or drink 3 hours prior to your test. If you need to reschedule for any reason, please contact radiology at [phone] to do so. __________________________________________________________________ A barium swallow is an examination that concentrates on views of the esophagus. This tends to be a double contrast exam (barium and two liquids which, when combined, create a gas to distend the wall of the oesophagus) or single contrast (non-ionic iodine based). The study is usually tailored to your symptoms so a good history is essential. Attention is paid during the study to the form, structure and configuration of the esophagus, looking for functional disorders (such as aspiration, dysphagia, achalasia, motility and reflux) EXAMINATION You may be asked to change into a gown, depending on the type of swallow being performed. A radiologist and radiographer will perform the procedure. The radiologist will advise you of the type of contrast selected for your procedure and direct you during the exam. You will be asked to stand, sit or lie in several different positions and to hold a small amount of fluid in your mouth before being asked to swallow while the imaging is performed .In some instances you may be asked to swallow barium coated marshmallows to assess the motility of a solid food bolus. The exam can be recorded as a digital or video fluoroscopy procedure. POST PROCEDURE It will take 1-2 days for the barium to pass through your system. To facilitate this, it is important, unless otherwise directed, to increase your fluids for the next 24-48hrs and to resume your normal diet.  This test typically takes about 30 minutes to  perform. __________________________________________________________________________________  LABS:  Lab work has been ordered for you today. Our lab is located in the basement. Press "B" on the elevator. The lab is located at the first door on the left as you exit the elevator.  HEALTHCARE LAWS AND MY CHART RESULTS: Due to recent changes in healthcare laws, you may see the results of your imaging and laboratory studies on MyChart before your provider has had a chance to review them.   We understand that in some cases there may be results that are confusing or concerning to you. Not all laboratory results come back in the same time frame and the provider may be waiting for multiple results in order to interpret others.  Please give [place] 48 hours in order for your provider to thoroughly review all the results before contacting the office for clarification of your results.   MEDICATION  We have sent the following medication to your pharmacy for you to pick up at your convenience: Pantoprazole 40 MG once a day 30 minutes before breakfast.  Please continue taking Zantac as needed. Please obtain cardiac clearance from your Cardiologist before we schedule the endoscopy. Follow up with Dr. Carlean Purl, we have scheduled you an appointment on 03/16/21 at 10:50 AM It was great seeing you today! Thank you for entrusting me with your care and choosing [facility].  Noralyn Pick, CRNP  Gastroesophageal Reflux Disease, Adult  Gastroesophageal reflux (GER) happens when acid from the stomach flows up into the tube that connects the mouth and the stomach (esophagus). Normally, food travels down the esophagus and stays in the stomach to be digested. With GER, food and stomach acid sometimes move back up  into the esophagus. You may have a disease called gastroesophageal reflux disease (GERD) if the reflux:  Happens often.  Causes frequent or very bad symptoms.  Causes problems such as  damage to the esophagus. When this happens, the esophagus becomes sore and swollen. Over time, GERD can make small holes (ulcers) in the lining of the esophagus. What are the causes? This condition is caused by a problem with the muscle between the esophagus and the stomach. When this muscle is weak or not normal, it does not close properly to keep food and acid from coming back up from the stomach. The muscle can be weak because of:  Tobacco use.  Pregnancy.  Having a certain type of hernia (hiatal hernia).  Alcohol use.  Certain foods and drinks, such as coffee, chocolate, onions, and peppermint. What increases the risk?  Being overweight.  Having a disease that affects your connective tissue.  Taking NSAIDs, such a ibuprofen. What are the signs or symptoms?  Heartburn.  Difficult or painful swallowing.  The feeling of having a lump in the throat.  A bitter taste in the mouth.  Bad breath.  Having a lot of saliva.  Having an upset or bloated stomach.  Burping.  Chest pain. Different conditions can cause chest pain. Make sure you see your doctor if you have chest pain.  Shortness of breath or wheezing.  A long-term cough or a cough at night.  Wearing away of the surface of teeth (tooth enamel).  Weight loss. How is this treated?  Making changes to your diet.  Taking medicine.  Having surgery. Treatment will depend on how bad your symptoms are. Follow these instructions at home: Eating and drinking  Follow a diet as told by your doctor. You may need to avoid foods and drinks such as: ? Coffee and tea, with or without caffeine. ? Drinks that contain alcohol. ? Energy drinks and sports drinks. ? Bubbly (carbonated) drinks or sodas. ? Chocolate and cocoa. ? Peppermint and mint flavorings. ? Garlic and onions. ? Horseradish. ? Spicy and acidic foods. These include peppers, chili powder, curry powder, vinegar, hot sauces, and BBQ sauce. ? Citrus fruit  juices and citrus fruits, such as oranges, lemons, and limes. ? Tomato-based foods. These include red sauce, chili, salsa, and pizza with red sauce. ? Fried and fatty foods. These include donuts, french fries, potato chips, and high-fat dressings. ? High-fat meats. These include hot dogs, rib eye steak, sausage, ham, and bacon. ? High-fat dairy items, such as whole milk, butter, and cream cheese.  Eat small meals often. Avoid eating large meals.  Avoid drinking large amounts of liquid with your meals.  Avoid eating meals during the 2-3 hours before bedtime.  Avoid lying down right after you eat.  Do not exercise right after you eat.   Lifestyle  Do not smoke or use any products that contain nicotine or tobacco. If you need help quitting, ask your doctor.  Try to lower your stress. If you need help doing this, ask your doctor.  If you are overweight, lose an amount of weight that is healthy for you. Ask your doctor about a safe weight loss goal.   General instructions  Pay attention to any changes in your symptoms.  Take over-the-counter and prescription medicines only as told by your doctor.  Do not take aspirin, ibuprofen, or other NSAIDs unless your doctor says it is okay.  Wear loose clothes. Do not wear anything tight around your waist.  Raise (  elevate) the head of your bed about 6 inches (15 cm). You may need to use a wedge to do this.  Avoid bending over if this makes your symptoms worse.  Keep all follow-up visits. Contact a doctor if:  You have new symptoms.  You lose weight and you do not know why.  You have trouble swallowing or it hurts to swallow.  You have wheezing or a cough that keeps happening.  You have a hoarse voice.  Your symptoms do not get better with treatment. Get help right away if:  You have sudden pain in your arms, neck, jaw, teeth, or back.  You suddenly feel sweaty, dizzy, or light-headed.  You have chest pain or shortness of  breath.  You vomit and the vomit is green, yellow, or black, or it looks like blood or coffee grounds.  You faint.  Your poop (stool) is red, bloody, or black.  You cannot swallow, drink, or eat. These symptoms may represent a serious problem that is an emergency. Do not wait to see if the symptoms will go away. Get medical help right away. Call your local emergency services (911 in the [place] Do not drive yourself to the hospital. Summary  If a person has gastroesophageal reflux disease (GERD), food and stomach acid move back up into the esophagus and cause symptoms or problems such as damage to the esophagus.  Treatment will depend on how bad your symptoms are.  Follow a diet as told by your doctor.  Take all medicines only as told by your doctor. This information is not intended to replace advice given to you by your health care provider. Make sure you discuss any questions you have with your health care provider. Document Revised: 06/14/2020 Document Reviewed: 06/14/2020 Elsevier Patient Education  [place].

## 2021-02-09 MED ORDER — NITROGLYCERIN 0.4 MG SL SUBL: 0.4000 mg | SUBLINGUAL_TABLET | SUBLINGUAL | 3 refills | Status: DC | PRN

## 2021-02-10 ENCOUNTER — Other Ambulatory Visit: Payer: Self-pay | Admitting: Nurse Practitioner

## 2021-02-10 DIAGNOSIS — R1013 Epigastric pain: Secondary | ICD-10-CM

## 2021-02-10 DIAGNOSIS — R10812 Left upper quadrant abdominal tenderness: Secondary | ICD-10-CM

## 2021-02-10 DIAGNOSIS — R10814 Left lower quadrant abdominal tenderness: Secondary | ICD-10-CM

## 2021-02-14 ENCOUNTER — Telehealth: Payer: Self-pay | Admitting: Internal Medicine

## 2021-02-14 ENCOUNTER — Ambulatory Visit (HOSPITAL_COMMUNITY): Admission: RE | Admit: 2021-02-14 | Payer: Medicare Other | Source: Ambulatory Visit

## 2021-02-14 NOTE — Telephone Encounter (Signed)
Spoke to patient to inform her that Dr Carlean Purl suggested patient have an UGI series instead of a barium esophagram. Patient is scheduled for UGI on 02/17/21. All questions answered. Patient voiced understanding. Instructions sent through [place]

## 2021-02-17 ENCOUNTER — Telehealth: Payer: Self-pay | Admitting: Nurse Practitioner

## 2021-02-17 ENCOUNTER — Other Ambulatory Visit: Payer: Self-pay

## 2021-02-17 ENCOUNTER — Ambulatory Visit (HOSPITAL_COMMUNITY)
Admission: RE | Admit: 2021-02-17 | Discharge: 2021-02-17 | Disposition: A | Discharge status: Home / Self Care | Payer: Medicare Other | Source: Ambulatory Visit | Attending: Nurse Practitioner | Admitting: Nurse Practitioner

## 2021-02-17 DIAGNOSIS — R1013 Epigastric pain: Secondary | ICD-10-CM | POA: Diagnosis present

## 2021-02-17 DIAGNOSIS — R10814 Left lower quadrant abdominal tenderness: Secondary | ICD-10-CM | POA: Diagnosis present

## 2021-02-17 DIAGNOSIS — R10812 Left upper quadrant abdominal tenderness: Secondary | ICD-10-CM | POA: Insufficient documentation

## 2021-02-17 NOTE — Telephone Encounter (Signed)
Provided a work excuse for patients daughter. She can view the letter in [place].

## 2021-02-17 NOTE — Telephone Encounter (Signed)
Pt called requesting a letter of work excuse for her daughter Drema Pry who took pt to her barium swallow exam at [facility] today. Pls send letter via [place].

## 2021-03-16 ENCOUNTER — Encounter: Payer: Self-pay | Admitting: Internal Medicine

## 2021-03-16 ENCOUNTER — Ambulatory Visit (INDEPENDENT_AMBULATORY_CARE_PROVIDER_SITE_OTHER): Payer: Medicare Other | Admitting: Internal Medicine

## 2021-03-16 VITALS — BP 124/80 | HR 76 | Ht 59.5 in | Wt 171.0 lb

## 2021-03-16 DIAGNOSIS — I201 Angina pectoris with documented spasm: Secondary | ICD-10-CM | POA: Diagnosis not present

## 2021-03-16 DIAGNOSIS — E65 Localized adiposity: Secondary | ICD-10-CM | POA: Diagnosis not present

## 2021-03-16 DIAGNOSIS — K219 Gastro-esophageal reflux disease without esophagitis: Secondary | ICD-10-CM | POA: Diagnosis not present

## 2021-03-16 DIAGNOSIS — R1319 Other dysphagia: Secondary | ICD-10-CM | POA: Diagnosis not present

## 2021-03-16 MED ORDER — PANTOPRAZOLE SODIUM 40 MG PO TBEC: 40.0000 mg | DELAYED_RELEASE_TABLET | Freq: Every day | ORAL | 3 refills | Status: AC

## 2021-03-16 NOTE — Patient Instructions (Signed)
You have been scheduled for an endoscopy. Please follow written instructions given to you at your visit today. If you use inhalers (even only as needed), please bring them with you on the day of your procedure.  Due to recent changes in healthcare laws, you may see the results of your imaging and laboratory studies on MyChart before your provider has had a chance to review them.  We understand that in some cases there may be results that are confusing or concerning to you. Not all laboratory results come back in the same time frame and the provider may be waiting for multiple results in order to interpret others.  Please give [place] 48 hours in order for your provider to thoroughly review all the results before contacting the office for clarification of your results.   We are providing you with diet information today to read.   I appreciate the opportunity to care for you. Silvano Rusk, MD, [facility]

## 2021-03-16 NOTE — Progress Notes (Addendum)
Kristin Roach 80 y.o. 07/11/1941 [id number]  Assessment & Plan:   Encounter Diagnoses  Name Primary?  . Esophageal dysphagia Yes  . Gastroesophageal reflux disease, unspecified whether esophagitis present   . Prinzmetal angina ([place])   . Abdominal obesity    Evaluate and treat dysphagia with EGD possible esophageal dilation.  Hang-up of tablet at GE junction suggest possible stricture.  She also has a prominent cricopharyngeus so a proximal dilation may be useful as well.  The chest symptoms went away with treatment for GERD so I do not think she needs cardiac clearance.  She has not had to use nitroglycerin since her visit in February and she has not used her supply of 30 tablets she got in 2019 so far.  I think weight loss might help her if possible it can be difficult I think eating real foods less processed foods trying to eliminate those and reducing carbohydrates would be helpful and I have given her some tips sheets to try to work on that.  This would help sleep apnea as well as overall wellbeing and GERD.  I appreciate the opportunity to care for this patient. CC: Jettie Booze, NP   Subjective:   Chief Complaint: Reflux, atypical chest pain (resolved)  HPI The patient is a 80 year old woman seen by Verne Lanuza Best in February with some left upper quadrant and chest wall pain.  Colleen placed the patient on pantoprazole and the patient tells me her symptoms are all gone.  She was going to do a barium swallow but I asked her to do an upper GI series to see if there might have been a hiatal hernia causing some of her symptoms but it did not reveal a large hiatal hernia.  There was a mildly prominent cricopharyngeus muscle, some proximal escape waves regarding motility thought to be okay overall, a single episode of gastroesophageal reflux, and hang-up of a 13 mm tablet at the GE junction.  The tablet passed with additional swallows of water.  No significant hiatal hernia  mentioned.  She does report some impact dysphagia with a suprasternal sticking point to solid foods intermittently.  Apparently has a history of esophageal dilation years ago.  Remembers having a sore throat afterwards.  Again she feels well at this time.  She reports that she had some upper respiratory illness recently which has resolved.  She was treated with an antibiotic.  Saw primary care.  She is not having any of her Prinzmetal's angina and no other chest pain at this time.  No new dyspnea etc.  She was originally considered to have a need for cardiac clearance because of the pain symptoms she was having but all of those symptoms disappeared after starting pantoprazole.  Wt Readings from Last 3 Encounters:  03/16/21 171 lb (77.6 kg)  02/08/21 167 lb 12.8 oz (76.1 kg)  11/01/20 169 lb 9.6 oz (76.9 kg)     Allergies  Allergen Reactions  . Ergonovine Other (See Comments)    Used in heart study Reaction: Throat closed up   . Demerol [Meperidine] Itching  . Erythromycin Nausea And Vomiting  . Flagyl [Metronidazole] Hives    Large amounts  . Percocet [Oxycodone-Acetaminophen] Other (See Comments)    nightmares   Current Meds  Medication Sig  . albuterol (VENTOLIN HFA) 108 (90 Base) MCG/ACT inhaler Inhale 2 puffs into the lungs every 6 (six) hours as needed for wheezing.  Marland Kitchen aspirin 81 MG tablet Take 81 mg by mouth daily.  Marland Kitchen  budesonide-formoterol (SYMBICORT) 80-4.5 MCG/ACT inhaler Inhale 2 puffs into the lungs as needed.   . citalopram (CELEXA) 10 MG tablet Take 10 mg by mouth daily.  . diazepam (VALIUM) 5 MG tablet Take 5 mg by mouth as needed for anxiety (when she takes nitroglycerin).   Marland Kitchen HYDROcodone-acetaminophen (NORCO) 7.5-325 MG tablet Take 1 tablet by mouth as needed for moderate pain. Pt takes 1 tablet by mouth 4(four) times a day as needed.  . nitroGLYCERIN (NITROSTAT) 0.4 MG SL tablet Place 1 tablet (0.4 mg total) under the tongue every 5 (five) minutes as needed for chest  pain.  . pantoprazole (PROTONIX) 40 MG tablet Take 1 tablet (40 mg total) by mouth daily. Take 30 minutes before breakfast.  . rOPINIRole (REQUIP) 1 MG tablet Take 1 mg by mouth at bedtime.   . rosuvastatin (CRESTOR) 40 MG tablet Take 1 tablet (40 mg total) by mouth daily.   Past Medical History:  Diagnosis Date  . Angina at rest [facility])   . Arthritis   . Asthma    bronchial asthma  . Cataracts, bilateral   . Complication of anesthesia    woke up during back procedure  . HOH (hard of hearing)   . Pneumonia    hx of 2012  . PONV (postoperative nausea and vomiting)    with knee surgery in 11 2015  . RLS (restless legs syndrome)   . Sleep apnea    cpap   Past Surgical History:  Procedure Laterality Date  . ABDOMINAL HYSTERECTOMY    . BACK SURGERY     laminectomy  . BELPHAROPTOSIS REPAIR    . CARDIAC CATHETERIZATION    . CESAREAN SECTION    . CHOLECYSTECTOMY    . COLONOSCOPY  2014   sigmoid hyperplastic polyps  . HERNIA REPAIR    . SHOULDER SURGERY     removal of bone spur  . TONSILLECTOMY    . TOTAL KNEE ARTHROPLASTY Left 10/26/2014   Procedure: LEFT TOTAL KNEE ARTHROPLASTY;  Surgeon: Kerin Salen, MD;  Location: [facility];  Service: Orthopedics;  Laterality: Left;   Social History   Social History Narrative   Widow   1 son and 1 daughter (another son - twin boy, died in childhood)   Has helped granddaughter with alcoholism, daughter has had methamphetamine addiction   Former smoker   1 caffeine/day   No etOH   family history includes Breast cancer in her paternal grandmother; Breast cancer (age of onset: 63) in her sister; Heart attack in her mother; Leukemia in her father.   Review of Systems As above  Objective:   Physical Exam BP 124/80 (BP Location: Left Arm, Patient Position: Sitting, Cuff Size: Normal)   Pulse 76   Ht 4' 11.5" (1.511 m) Comment: height measured without shoes  Wt 171 lb (77.6 kg)   BMI 33.96 kg/m  Well-developed elderly white woman no  acute distress Lungs are clear without wheezes or rales Heart sounds normal S1-S2 no rubs murmurs or gallops Abdomen obese  Data reviewed includes primary care note 02/23/2021 where she was treated with a short course of prednisone and a Z-Pak for sinusitis.  Upper GI series including images as well.

## 2021-05-05 ENCOUNTER — Other Ambulatory Visit: Payer: Self-pay

## 2021-05-05 ENCOUNTER — Ambulatory Visit: Payer: Medicare Other | Admitting: Cardiology

## 2021-05-05 ENCOUNTER — Encounter: Payer: Self-pay | Admitting: Internal Medicine

## 2021-05-05 ENCOUNTER — Ambulatory Visit (AMBULATORY_SURGERY_CENTER): Payer: Medicare Other | Admitting: Internal Medicine

## 2021-05-05 VITALS — BP 118/63 | HR 66 | Temp 97.3°F | Resp 17 | Ht <= 58 in | Wt 171.0 lb

## 2021-05-05 DIAGNOSIS — R1319 Other dysphagia: Secondary | ICD-10-CM

## 2021-05-05 MED ORDER — SODIUM CHLORIDE 0.9 % IV SOLN
500.0000 mL | Freq: Once | INTRAVENOUS | Status: DC
Start: 2021-05-05 — End: 2021-05-05

## 2021-05-05 NOTE — Progress Notes (Signed)
Called to room to assist during endoscopic procedure.  Patient ID and intended procedure confirmed with present staff. Received instructions for my participation in the procedure from the performing physician.  

## 2021-05-05 NOTE — Patient Instructions (Addendum)
Everything looked ok - I dilated the esophagus - let us see if that helps you swallow better. In many cases as we age there is some reduced function of the esophagus that causes these symptoms.  If that fails to help over the next few weeks please let me know.  I appreciate the opportunity to care for you. Gatha Mayer, MD, [facility]  Please read handouts provided. Continue present medications. Follow Dilation Diet instructions.   YOU HAD AN ENDOSCOPIC PROCEDURE TODAY AT [facility]:   Refer to the procedure report that was given to you for any specific questions about what was found during the examination.  If the procedure report does not answer your questions, please call your gastroenterologist to clarify.  If you requested that your care partner not be given the details of your procedure findings, then the procedure report has been included in a sealed envelope for you to review at your convenience later.  YOU SHOULD EXPECT: Some feelings of bloating in the abdomen. Passage of more gas than usual.  Walking can help get rid of the air that was put into your GI tract during the procedure and reduce the bloating. If you had a lower endoscopy (such as a colonoscopy or flexible sigmoidoscopy) you may notice spotting of blood in your stool or on the toilet paper. If you underwent a bowel prep for your procedure, you may not have a normal bowel movement for a few days.  Please Note:  You might notice some irritation and congestion in your nose or some drainage.  This is from the oxygen used during your procedure.  There is no need for concern and it should clear up in a day or so.  SYMPTOMS TO REPORT IMMEDIATELY:    Following upper endoscopy (EGD)  Vomiting of blood or coffee ground material  New chest pain or pain under the shoulder blades  Painful or persistently difficult swallowing  New shortness of breath  Fever of 100F or higher  Black, tarry-looking stools  For  urgent or emergent issues, a gastroenterologist can be reached at any hour by calling [phone]. Do not use MyChart messaging for urgent concerns.    DIET:   Drink plenty of fluids but you should avoid alcoholic beverages for 24 hours.  ACTIVITY:  You should plan to take it easy for the rest of today and you should NOT DRIVE or use heavy machinery until tomorrow (because of the sedation medicines used during the test).    FOLLOW UP: Our staff will call the number listed on your records 48-72 hours following your procedure to check on you and address any questions or concerns that you may have regarding the information given to you following your procedure. If we do not reach you, we will leave a message.  We will attempt to reach you two times.  During this call, we will ask if you have developed any symptoms of COVID 19. If you develop any symptoms (ie: fever, flu-like symptoms, shortness of breath, cough etc.) before then, please call [phone].  If you test positive for Covid 19 in the 2 weeks post procedure, please call and report this information to [place].    If any biopsies were taken you will be contacted by phone or by letter within the next 1-3 weeks.  Please call us at [phone] if you have not heard about the biopsies in 3 weeks.    SIGNATURES/CONFIDENTIALITY: You and/or your care partner have signed  paperwork which will be entered into your electronic medical record.  These signatures attest to the fact that that the information above on your After Visit Summary has been reviewed and is understood.  Full responsibility of the confidentiality of this discharge information lies with you and/or your care-partner.

## 2021-05-05 NOTE — Progress Notes (Signed)
pt tolerated well. VSS. awake and to recovery. Report given to RN. Bite left insitu to recovery. No signs of trauma.

## 2021-05-05 NOTE — Op Note (Signed)
[place] Patient Name: Kristin Roach Procedure Date: 05/05/2021 9:44 AM MRN: [id number] Endoscopist: Gatha Mayer , MD Age: 80 Referring MD:  Date of Birth: 1941-02-17 Gender: Female Account #: [id number] Procedure:                Upper GI endoscopy Indications:              Dysphagia Medicines:                Propofol per Anesthesia, Monitored Anesthesia Care Procedure:                Pre-Anesthesia Assessment:                           - Prior to the procedure, a History and Physical                            was performed, and patient medications and                            allergies were reviewed. The patient's tolerance of                            previous anesthesia was also reviewed. The risks                            and benefits of the procedure and the sedation                            options and risks were discussed with the patient.                            All questions were answered, and informed consent                            was obtained. Prior Anticoagulants: The patient has                            taken no previous anticoagulant or antiplatelet                            agents. ASA Grade Assessment: III - A patient with                            severe systemic disease. After reviewing the risks                            and benefits, the patient was deemed in                            satisfactory condition to undergo the procedure.                           After obtaining informed consent, the endoscope was  passed under direct vision. Throughout the                            procedure, the patient's blood pressure, pulse, and                            oxygen saturations were monitored continuously. The                            Endoscope was introduced through the mouth, and                            advanced to the second part of duodenum. The upper                            GI endoscopy was  accomplished without difficulty.                            The patient tolerated the procedure well. Scope In: Scope Out: Findings:                 No endoscopic abnormality was evident in the                            esophagus to explain the patient's complaint of                            dysphagia. It was decided, however, to proceed with                            dilation of the entire esophagus. The scope was                            withdrawn. Dilation was performed with a Maloney                            dilator with no resistance at 16 Fr. The dilation                            site was examined following endoscope reinsertion                            and showed no change. Estimated blood loss: none.                           The entire examined stomach was normal.                           The gastroesophageal flap valve was visualized                            endoscopically and classified as Hill Grade II                            (  fold present, opens with respiration).                           The examined duodenum was normal.                           The cardia and gastric fundus were normal on                            retroflexion. Complications:            No immediate complications. Estimated Blood Loss:     Estimated blood loss: none. Impression:               - No endoscopic esophageal abnormality to explain                            patient's dysphagia. Esophagus dilated. Dilated.                           - Normal stomach.                           - Gastroesophageal flap valve classified as Hill                            Grade II (fold present, opens with respiration).                           - Normal examined duodenum.                           - No specimens collected. Recommendation:           - Patient has a contact number available for                            emergencies. The signs and symptoms of potential                             delayed complications were discussed with the                            patient. Return to normal activities tomorrow.                            Written discharge instructions were provided to the                            patient.                           - Clear liquids x 1 hour then soft foods rest of                            day. Start prior diet tomorrow.                           -  Continue present medications.                           - If this fails to help adequately she is to                            contact me. Gatha Mayer, MD 05/05/2021 10:07:19 AM This report has been signed electronically.

## 2021-05-09 ENCOUNTER — Telehealth: Payer: Self-pay

## 2021-05-09 NOTE — Telephone Encounter (Signed)
Attempted to reach pt. With follow-up call following endoscopic procedure 05/05/2021.  LM On pt. Voice mail to call if she has any questions or concerns.

## 2021-05-09 NOTE — Telephone Encounter (Signed)
  Follow up Call-  Call back number 05/05/2021  Post procedure Call Back phone  # [phone]  Permission to leave phone message Yes  Some recent data might be hidden    1st follow up call made. NALM

## 2021-05-24 NOTE — Progress Notes (Signed)
Cardiology Office Note:    Date:  05/25/2021   ID:  Kristin Roach, DOB 04-Jan-1941, MRN [id number]  PCP:  Jettie Booze, NP  Cardiologist:  Buford Dresser, MD PhD  Referring MD: Jettie Booze, NP   CC: follow up  History of Present Illness:    Kristin Roach is a 80 y.o. female with a hx of OSA, asthma, arthritis, Prinzmetal's angina. I initially met her 07/30/18 as a new consult.  Cardiac history: Saw Dr. Bayard Hugger in [place], [place] (Fax [phone]). Notes states: Heart murmur, mitral valve prolapse; sleep apnea, uses CPAP. Dr. Joaquim Lai at [facility] was her internal medicine physician. Thinks her last stress test was around 2012. Echo in 2017. Had prior heart caths in the early 1990s, did not require stents but does report that she had vasconstriction with ergotamine.  Admitted 09/07/19 for chest pain. Seen by Dr. Irish Lack in the hospital. Had CT coronary with nonobstructive disease.  Today: She reports having the flu 4 times this year. Last week she completed her Z-pack antibiotic. At this time she is feeling better but still is not at her baseline energy level, and is somewhat fatigued. Her main complaint today is persistent congestion in her lungs. There has been some sputum production as well.   She has some imbalance when walking but is able to ambulate with a can. Previously she has fallen several times. She endorses some LE edema that she attributes to lymphedema.  She denies any chest pain, shortness of breath, palpitations, or exertional symptoms. No headaches, lightheadedness, or syncope to report. Also has no orthopnea or PND. She denies any bleeding issues.   Past Medical History:  Diagnosis Date  . Angina at rest [facility])   . Anxiety   . Arthritis   . Asthma    bronchial asthma  . CAD (coronary artery disease)   . Cancer ([place])   . Cataracts, bilateral   . Complication of anesthesia    woke up during back procedure  . HOH (hard of hearing)   . Lymphedema of  both lower extremities   . Pneumonia    hx of 2012  . PONV (postoperative nausea and vomiting)    with knee surgery in 11 2015  . RLS (restless legs syndrome)   . Sleep apnea    cpap    Past Surgical History:  Procedure Laterality Date  . ABDOMINAL HYSTERECTOMY    . BACK SURGERY     laminectomy  . BELPHAROPTOSIS REPAIR    . CARDIAC CATHETERIZATION    . CESAREAN SECTION    . CHOLECYSTECTOMY    . COLONOSCOPY  2014   sigmoid hyperplastic polyps  . HERNIA REPAIR    . SHOULDER SURGERY     removal of bone spur  . TONSILLECTOMY    . TOTAL KNEE ARTHROPLASTY Left 10/26/2014   Procedure: LEFT TOTAL KNEE ARTHROPLASTY;  Surgeon: Kerin Salen, MD;  Location: [facility];  Service: Orthopedics;  Laterality: Left;    Current Medications: Current Outpatient Medications on File Prior to Visit  Medication Sig  . aspirin 81 MG tablet Take 81 mg by mouth daily.  . citalopram (CELEXA) 10 MG tablet Take 10 mg by mouth daily.  . diazepam (VALIUM) 5 MG tablet Take 5 mg by mouth as needed for anxiety (when she takes nitroglycerin).   Marland Kitchen HYDROcodone-acetaminophen (NORCO) 7.5-325 MG tablet Take 1 tablet by mouth as needed for moderate pain. Pt takes 1 tablet by mouth 4(four) times a day  as needed.  . hydrocortisone 2.5 % cream Apply topically.  . methocarbamol (ROBAXIN) 500 MG tablet Take 1 tablet by mouth every six hours as needed for spasms  . nitroGLYCERIN (NITROSTAT) 0.4 MG SL tablet Place 1 tablet (0.4 mg total) under the tongue every 5 (five) minutes as needed for chest pain.  . pantoprazole (PROTONIX) 40 MG tablet Take 1 tablet (40 mg total) by mouth daily. Take 30 minutes before breakfast.  . rOPINIRole (REQUIP) 1 MG tablet Take 1 mg by mouth at bedtime.   . rosuvastatin (CRESTOR) 40 MG tablet Take 1 tablet (40 mg total) by mouth daily.   No current facility-administered medications on file prior to visit.     Allergies:   Epinephrine, Ergonovine, Demerol [meperidine], Erythromycin, Flagyl  [metronidazole], and Percocet [oxycodone-acetaminophen]   Social History   Tobacco Use  . Smoking status: Never Smoker  . Smokeless tobacco: Never Used  Vaping Use  . Vaping Use: Never used  Substance Use Topics  . Alcohol use: Not Currently    Alcohol/week: 14.0 standard drinks    Types: 14 Glasses of wine per week  . Drug use: No   Family history: mother passed from MI in age 8, father died of leukemia at age 36. Two grandparents died from MI. Sister just died of lung cancer at age 56. No other siblings. Son and daughter both have terrible migraines. Daughter was a methamphetamine user, had been sober for 8 years but has some residual issues.   ROS:   Please see the history of present illness.   (+) Fatigue (+) Congestion (+) Imbalance when walking (+) Sputum production (+) Lymphedema Additional pertinent ROS otherwise unremarkable.    EKGs/Labs/Other Studies Reviewed:    The following studies were reviewed today:  CT September 19, 2019 Aorta:  Normal size.  Mild diffuse calcifications.  No dissection.  Aortic Valve:  Trileaflet.  Mild calcifications.  Coronary Arteries:  Normal coronary origin.  Right dominance.  RCA is a large dominant artery that gives rise to PDA and PLA. There are only minimal irregularities.  Left main is a large artery that gives rise to LAD and LCX arteries.  LAD is a large vessel that has mild calcified plaque in the proximal and mid portion with stenosis 25-49%.  LCX is a large non-dominant artery that gives rise to one large OM1 branch. There are only minimal irregularities.  OM1 is a very large branch that has no plaque.  Other findings:  Normal pulmonary vein drainage into the left atrium.  Normal left atrial appendage without a thrombus.  IMPRESSION: 1. Coronary calcium score of 208. This was 72 percentile for age and sex matched control.  2. Normal coronary origin with right dominance.  3. CAD-RADS 2. Mild  non-obstructive CAD (25-49%). Consider non-atherosclerotic causes of chest pain. Consider preventive therapy and risk factor modification.  4. Mildly dilated pulmonary artery measuring 32 mm.  Echo 09/08/19  1. Left ventricular ejection fraction, by visual estimation, is 40 to 45%. The left ventricle has normal function. Normal left ventricular size. There is no left ventricular hypertrophy.  2. Left ventricular diastolic Doppler parameters are consistent with impaired relaxation pattern of LV diastolic filling.  3. Global right ventricle has normal systolic function.The right ventricular size is normal. No increase in right ventricular wall thickness.  4. Left atrial size was normal.  5. Right atrial size was normal.  6. The mitral valve is normal in structure. Mild mitral valve regurgitation. No evidence of mitral stenosis.  7.  The tricuspid valve is normal in structure. Tricuspid valve regurgitation is trivial.  8. The aortic valve is normal in structure. Aortic valve regurgitation was not visualized by color flow Doppler. Structurally normal aortic valve, with no evidence of sclerosis or stenosis.  9. The pulmonic valve was normal in structure. Pulmonic valve regurgitation is not visualized by color flow Doppler. 10. Normal pulmonary artery systolic pressure. 11. The inferior vena cava is normal in size with greater than 50% respiratory variability, suggesting right atrial pressure of 3 mmHg.   EKG:  EKG is personally reviewed today. 05/25/2021: EKG is not ordered today. 11/01/2020: normal sinus rhythm, LBBB at 62 bpm  Recent Labs: 02/08/2021: ALT 29; BUN 27; Creatinine, Ser 0.62; Hemoglobin 13.5; Platelets 262.0; Potassium 4.3; Sodium 139  Recent Lipid Panel    Component Value Date/Time   CHOL 131 04/26/2020 1610   TRIG 94 04/26/2020 1610   HDL 59 04/26/2020 1610   CHOLHDL 2.2 04/26/2020 1610   CHOLHDL 4.7 09/09/2019 0329   VLDL 18 09/09/2019 0329   LDLCALC 54 04/26/2020 1610     Physical Exam:    VS:  BP 130/72   Pulse 83   Ht $R'4\' 10"'ua$  (1.473 m)   Wt 170 lb (77.1 kg)   SpO2 95%   BMI 35.53 kg/m     Wt Readings from Last 3 Encounters:  05/25/21 170 lb (77.1 kg)  05/05/21 171 lb (77.6 kg)  03/16/21 171 lb (77.6 kg)    GEN: Well nourished, well developed in no acute distress HEENT: Normal, moist mucous membranes NECK: No JVD CARDIAC: regular rhythm, normal S1 and S2, no rubs or gallops. No murmur. VASCULAR: Radial and DP pulses 2+ bilaterally. No carotid bruits RESPIRATORY:  Clear to auscultation without rales or wheezing; +rhonchi at base of right lung. ABDOMEN: Soft, non-tender, non-distended MUSCULOSKELETAL:  Ambulates independently SKIN: Warm and dry, no significant pitting edema, mild chronic nonpitting edema NEUROLOGIC:  Alert and oriented x 3. No focal neuro deficits noted. PSYCHIATRIC:  Normal affect   ASSESSMENT:    1. Nonocclusive coronary atherosclerosis of native coronary artery   2. Systolic dysfunction without heart failure   3. Labile blood pressure   4. Counseling on health promotion and disease prevention    PLAN:    Nonobstructive CAD, history of Prinzmetal's angina, prior admission for chest pain:  -CT coronary with nonobstructive CAD -on aspirin 81 mg, discussed recommendations for this today, she will continue as she is not having bleeding issues -continue rosuvastatin 40 mg daily, tolerating -we have previously discussed recommended avoidance of NSAIDs in CAD -has SL NG, no recent use -counseled on red flag warning signs that need immediate medical attention -no longer on beta blocker due to low blood pressure  Systolic dysfunction without clinical heart failure -EF 40-45% -nonobstructive CAD on CT coronary -no longer on carvedilol due to hypotension, also limited ACEi/ARB/ARNI.   Labile BP: -has been variable. Reports systolic BP in the 01V with recent procedure but feels fine. Well controlled  today.  Cardiovascular risk counseling and prevention: -recommend heart healthy/Mediterranean diet, with whole grains, fruits, vegetable, fish, lean meats, nuts, and olive oil. Limit salt. -recommend moderate walking, 3-5 times/week for 30-50 minutes each session. Aim for at least 150 minutes.week. Goal should be pace of 3 miles/hours, or walking 1.5 miles in 30 minutes -recommend avoidance of tobacco products. Avoid excess alcohol.  Plan for follow up: 1 year or sooner PRN.  Medication Adjustments/Labs and Tests Ordered: Current medicines are reviewed at length  with the patient today.  Concerns regarding medicines are outlined above.  No orders of the defined types were placed in this encounter.  No orders of the defined types were placed in this encounter.   Patient Instructions  Medication Instructions:  Your Physician recommend you continue on your current medication as directed.    *If you need a refill on your cardiac medications before your next appointment, please call your pharmacy*   Lab Work: None ordered today   Testing/Procedures: None ordered today   Follow-Up: At [facility], you and your health needs are our priority.  As part of our continuing mission to provide you with exceptional heart care, we have created designated Provider Care Teams.  These Care Teams include your primary Cardiologist (physician) and Advanced Practice Providers (APPs -  Physician Assistants and Nurse Practitioners) who all work together to provide you with the care you need, when you need it.  We recommend signing up for the patient portal called "MyChart".  Sign up information is provided on this After Visit Summary.  MyChart is used to connect with patients for Virtual Visits (Telemedicine).  Patients are able to view lab/test results, encounter notes, upcoming appointments, etc.  Non-urgent messages can be sent to your provider as well.   To learn more about what you can do with  MyChart, go to [email or web address].    Your next appointment:   1 year(s) @ [address] [place] [address], [place] [address]   The format for your next appointment:   In Person  Provider:   Buford Dresser, MD        [facility],acting as a scribe for Buford Dresser, MD.,have documented all relevant documentation on the behalf of Buford Dresser, MD,as directed by  Buford Dresser, MD while in the presence of Buford Dresser, MD.  I, Buford Dresser, MD, have reviewed all documentation for this visit. The documentation on 05/25/21 for the exam, diagnosis, procedures, and orders are all accurate and complete.  Signed, Buford Dresser, MD PhD 05/25/2021   [place]

## 2021-05-25 ENCOUNTER — Other Ambulatory Visit: Payer: Self-pay

## 2021-05-25 ENCOUNTER — Ambulatory Visit (INDEPENDENT_AMBULATORY_CARE_PROVIDER_SITE_OTHER): Payer: Medicare Other | Admitting: Cardiology

## 2021-05-25 ENCOUNTER — Encounter: Payer: Self-pay | Admitting: Cardiology

## 2021-05-25 VITALS — BP 130/72 | HR 83 | Ht <= 58 in | Wt 170.0 lb

## 2021-05-25 DIAGNOSIS — I201 Angina pectoris with documented spasm: Secondary | ICD-10-CM | POA: Diagnosis not present

## 2021-05-25 DIAGNOSIS — Z7189 Other specified counseling: Secondary | ICD-10-CM | POA: Diagnosis not present

## 2021-05-25 DIAGNOSIS — I2510 Atherosclerotic heart disease of native coronary artery without angina pectoris: Secondary | ICD-10-CM | POA: Diagnosis not present

## 2021-05-25 DIAGNOSIS — I5189 Other ill-defined heart diseases: Secondary | ICD-10-CM

## 2021-05-25 DIAGNOSIS — R0989 Other specified symptoms and signs involving the circulatory and respiratory systems: Secondary | ICD-10-CM | POA: Diagnosis not present

## 2021-05-25 NOTE — Patient Instructions (Signed)
Medication Instructions:  Your Physician recommend you continue on your current medication as directed.    *If you need a refill on your cardiac medications before your next appointment, please call your pharmacy*   Lab Work: None ordered today   Testing/Procedures: None ordered today   Follow-Up: At [facility], you and your health needs are our priority.  As part of our continuing mission to provide you with exceptional heart care, we have created designated Provider Care Teams.  These Care Teams include your primary Cardiologist (physician) and Advanced Practice Providers (APPs -  Physician Assistants and Nurse Practitioners) who all work together to provide you with the care you need, when you need it.  We recommend signing up for the patient portal called "MyChart".  Sign up information is provided on this After Visit Summary.  MyChart is used to connect with patients for Virtual Visits (Telemedicine).  Patients are able to view lab/test results, encounter notes, upcoming appointments, etc.  Non-urgent messages can be sent to your provider as well.   To learn more about what you can do with MyChart, go to [email or web address].    Your next appointment:   1 year(s) @ [address] [place], [place] [address]   The format for your next appointment:   In Person  Provider:   Buford Dresser, MD

## 2021-07-15 ENCOUNTER — Other Ambulatory Visit: Payer: Self-pay

## 2021-07-15 MED ORDER — ROSUVASTATIN CALCIUM 40 MG PO TABS: 40.0000 mg | ORAL_TABLET | Freq: Every day | ORAL | 0 refills | Status: DC

## 2021-08-17 ENCOUNTER — Emergency Department (HOSPITAL_BASED_OUTPATIENT_CLINIC_OR_DEPARTMENT_OTHER): Payer: Medicare Other | Admitting: Radiology

## 2021-08-17 ENCOUNTER — Emergency Department (HOSPITAL_BASED_OUTPATIENT_CLINIC_OR_DEPARTMENT_OTHER)
Admission: EM | Admit: 2021-08-17 | Discharge: 2021-08-18 | Disposition: A | Discharge status: Home / Self Care | Payer: Medicare Other | Attending: Emergency Medicine | Admitting: Emergency Medicine

## 2021-08-17 ENCOUNTER — Encounter (HOSPITAL_BASED_OUTPATIENT_CLINIC_OR_DEPARTMENT_OTHER): Payer: Self-pay | Admitting: *Deleted

## 2021-08-17 ENCOUNTER — Other Ambulatory Visit: Payer: Self-pay

## 2021-08-17 ENCOUNTER — Emergency Department (HOSPITAL_BASED_OUTPATIENT_CLINIC_OR_DEPARTMENT_OTHER): Payer: Medicare Other

## 2021-08-17 DIAGNOSIS — R0789 Other chest pain: Secondary | ICD-10-CM

## 2021-08-17 DIAGNOSIS — R0601 Orthopnea: Secondary | ICD-10-CM | POA: Insufficient documentation

## 2021-08-17 DIAGNOSIS — I2510 Atherosclerotic heart disease of native coronary artery without angina pectoris: Secondary | ICD-10-CM | POA: Insufficient documentation

## 2021-08-17 DIAGNOSIS — M549 Dorsalgia, unspecified: Secondary | ICD-10-CM | POA: Diagnosis not present

## 2021-08-17 DIAGNOSIS — R079 Chest pain, unspecified: Secondary | ICD-10-CM

## 2021-08-17 DIAGNOSIS — R072 Precordial pain: Secondary | ICD-10-CM | POA: Insufficient documentation

## 2021-08-17 DIAGNOSIS — R7989 Other specified abnormal findings of blood chemistry: Secondary | ICD-10-CM

## 2021-08-17 DIAGNOSIS — J45909 Unspecified asthma, uncomplicated: Secondary | ICD-10-CM | POA: Insufficient documentation

## 2021-08-17 DIAGNOSIS — R600 Localized edema: Secondary | ICD-10-CM | POA: Insufficient documentation

## 2021-08-17 DIAGNOSIS — Z859 Personal history of malignant neoplasm, unspecified: Secondary | ICD-10-CM | POA: Diagnosis not present

## 2021-08-17 DIAGNOSIS — Z7982 Long term (current) use of aspirin: Secondary | ICD-10-CM | POA: Insufficient documentation

## 2021-08-17 DIAGNOSIS — R0602 Shortness of breath: Secondary | ICD-10-CM | POA: Diagnosis not present

## 2021-08-17 DIAGNOSIS — Z96652 Presence of left artificial knee joint: Secondary | ICD-10-CM | POA: Diagnosis not present

## 2021-08-17 LAB — BRAIN NATRIURETIC PEPTIDE: B Natriuretic Peptide: 145.6 pg/mL — ABNORMAL HIGH (ref 0.0–100.0)

## 2021-08-17 LAB — CBC
HCT: 38.3 % (ref 36.0–46.0)
Hemoglobin: 12.1 g/dL (ref 12.0–15.0)
MCH: 28.4 pg (ref 26.0–34.0)
MCHC: 31.6 g/dL (ref 30.0–36.0)
MCV: 89.9 fL (ref 80.0–100.0)
Platelets: 260 10*3/uL (ref 150–400)
RBC: 4.26 MIL/uL (ref 3.87–5.11)
RDW: 14 % (ref 11.5–15.5)
WBC: 8.4 10*3/uL (ref 4.0–10.5)
nRBC: 0 % (ref 0.0–0.2)

## 2021-08-17 LAB — BASIC METABOLIC PANEL
Anion gap: 10 (ref 5–15)
BUN: 14 mg/dL (ref 8–23)
CO2: 28 mmol/L (ref 22–32)
Calcium: 9.3 mg/dL (ref 8.9–10.3)
Chloride: 102 mmol/L (ref 98–111)
Creatinine, Ser: 0.56 mg/dL (ref 0.44–1.00)
GFR, Estimated: >60 mL/min (ref >60)
Glucose, Bld: 210 mg/dL — ABNORMAL HIGH (ref 70–99)
Potassium: 3.6 mmol/L (ref 3.5–5.1)
Sodium: 140 mmol/L (ref 135–145)

## 2021-08-17 LAB — D-DIMER, QUANTITATIVE: D-Dimer, Quant: 0.72 ug/mL-FEU — ABNORMAL HIGH (ref 0.00–0.50)

## 2021-08-17 LAB — TROPONIN I (HIGH SENSITIVITY)
Troponin I (High Sensitivity): 17 ng/L (ref <18)
Troponin I (High Sensitivity): 18 ng/L — ABNORMAL HIGH (ref <18)

## 2021-08-17 MED ORDER — IOHEXOL 350 MG/ML SOLN
100.0000 mL | Freq: Once | INTRAVENOUS | Status: AC | PRN
  Administered 2021-08-17: 75 mL via INTRAVENOUS

## 2021-08-17 NOTE — ED Notes (Signed)
RT ambulating pt through ED on pulse ox w/readings between 93%-95%. Pts respiratory status remained stable throughout ambulation, no distress noted during or post ambulation. MD notified of results from ambulation. RT will continue to monitor.

## 2021-08-17 NOTE — ED Provider Notes (Signed)
[facility] DEPT Provider Note   CSN: [id number] Arrival date & time: 08/17/21  1819     History Chief Complaint  Patient presents with   Chest Pain   Back Pain   Shortness of Breath    Kristin Roach is a 80 y.o. female.  The history is provided by the patient and medical records. No language interpreter was used.  Chest Pain Pain location:  Substernal area Pain quality: tightness   Pain radiates to:  L shoulder and R shoulder Pain severity:  Moderate Onset quality:  Gradual Duration:  3 days Timing:  Constant Progression:  Waxing and waning Chronicity:  New Relieved by:  Certain positions Worsened by:  Certain positions Ineffective treatments:  None tried Associated symptoms: back pain, lower extremity edema, orthopnea and shortness of breath   Associated symptoms: no abdominal pain, no altered mental status, no anxiety, no cough, no dizziness, no fatigue, no fever, no headache, no nausea, no numbness, no palpitations, no syncope, no vomiting and no weakness  Heartburn: chronically. Back Pain Associated symptoms: chest pain   Associated symptoms: no abdominal pain, no fever, no headaches, no numbness and no weakness   Shortness of Breath Associated symptoms: chest pain   Associated symptoms: no abdominal pain, no cough, no fever, no headaches, no neck pain, no rash, no syncope, no vomiting and no wheezing       Past Medical History:  Diagnosis Date   Angina at rest [facility])    Anxiety    Arthritis    Asthma    bronchial asthma   CAD (coronary artery disease)    Cancer (HCC)    Cataracts, bilateral    Complication of anesthesia    woke up during back procedure   HOH (hard of hearing)    Lymphedema of both lower extremities    Pneumonia    hx of 2012   PONV (postoperative nausea and vomiting)    with knee surgery in 11 2015   RLS (restless legs syndrome)    Sleep apnea    cpap    Patient Active Problem List   Diagnosis Date Noted    Chest pain 09/07/2019   BPPV (benign paroxysmal positional vertigo), left 05/06/2019   Restless leg syndrome 12/25/2018   Bilateral sensorineural hearing loss 03/06/2018   OSA (obstructive sleep apnea) 03/06/2018   Mild intermittent asthma without complication [id number]   Anxiety 03/31/2017   Choroidal nevus of left eye 02/01/2017   Cortical cataract of both eyes 02/01/2017   Lymphedema 10/16/2016   Localized edema 08/21/2016   Chronic pain of both lower extremities 05/05/2016   Dry eye 05/02/2016   Glaucoma suspect of both eyes 05/02/2016   Arthritis of left knee Primary 10/26/2014    Past Surgical History:  Procedure Laterality Date   ABDOMINAL HYSTERECTOMY     BACK SURGERY     laminectomy   BELPHAROPTOSIS REPAIR     CARDIAC CATHETERIZATION     CESAREAN SECTION     CHOLECYSTECTOMY     COLONOSCOPY  2014   sigmoid hyperplastic polyps   HERNIA REPAIR     SHOULDER SURGERY     removal of bone spur   TONSILLECTOMY     TOTAL KNEE ARTHROPLASTY Left 10/26/2014   Procedure: LEFT TOTAL KNEE ARTHROPLASTY;  Surgeon: Kerin Salen, MD;  Location: [place] [facility];  Service: Orthopedics;  Laterality: Left;     OB History     Gravida  5   Para  3  Term      Preterm      AB      Living         SAB      IAB      Ectopic      Multiple      Live Births              Family History  Problem Relation Age of Onset   Heart attack Mother        (died at 57 from MI)   Leukemia Father    Breast cancer Sister 74   Breast cancer Paternal Grandmother     Social History   Tobacco Use   Smoking status: Never   Smokeless tobacco: Never  Vaping Use   Vaping Use: Never used  Substance Use Topics   Alcohol use: Not Currently    Alcohol/week: 14.0 standard drinks    Types: 14 Glasses of wine per week   Drug use: No    Home Medications Prior to Admission medications   Medication Sig Start Date End Date Taking? Authorizing Provider  citalopram (CELEXA) 10 MG tablet Take  10 mg by mouth daily. 10/14/20  Yes [provider]  HYDROcodone-acetaminophen (NORCO) 7.5-325 MG tablet Take 1 tablet by mouth as needed for moderate pain. Pt takes 1 tablet by mouth 4(four) times a day as needed.   Yes [provider]  rOPINIRole (REQUIP) 1 MG tablet Take 1 mg by mouth at bedtime.  09/23/19  Yes [provider]  rosuvastatin (CRESTOR) 40 MG tablet Take 1 tablet (40 mg total) by mouth daily. 07/15/21  Yes Buford Dresser, MD  aspirin 81 MG tablet Take 81 mg by mouth daily.    [provider]  diazepam (VALIUM) 5 MG tablet Take 5 mg by mouth as needed for anxiety (when she takes nitroglycerin).  06/13/17   [provider]  hydrocortisone 2.5 % cream Apply topically. 04/15/21   [provider]  methocarbamol (ROBAXIN) 500 MG tablet Take 1 tablet by mouth every six hours as needed for spasms 01/17/21   [provider]  nitroGLYCERIN (NITROSTAT) 0.4 MG SL tablet Place 1 tablet (0.4 mg total) under the tongue every 5 (five) minutes as needed for chest pain. 02/09/21   Buford Dresser, MD  pantoprazole (PROTONIX) 40 MG tablet Take 1 tablet (40 mg total) by mouth daily. Take 30 minutes before breakfast. 03/16/21   Gatha Mayer, MD    Allergies    Epinephrine, Ergonovine, Demerol [meperidine], Erythromycin, Flagyl [metronidazole], and Percocet [oxycodone-acetaminophen]  Review of Systems   Review of Systems  Constitutional:  Negative for chills, fatigue and fever.  HENT:  Negative for congestion.   Eyes:  Negative for visual disturbance.  Respiratory:  Positive for chest tightness and shortness of breath. Negative for cough, wheezing and stridor.   Cardiovascular:  Positive for chest pain and orthopnea. Negative for palpitations and syncope. Leg swelling: unchanged rfom her baseline. Gastrointestinal:  Negative for abdominal pain, constipation, diarrhea, nausea and vomiting. Heartburn:  chronically. Genitourinary:  Negative for frequency.  Musculoskeletal:  Positive for back pain. Negative for neck pain and neck stiffness.  Skin:  Negative for rash and wound.  Neurological:  Negative for dizziness, seizures, weakness, light-headedness, numbness and headaches.  Psychiatric/Behavioral:  Negative for agitation.   All other systems reviewed and are negative.  Physical Exam Updated Vital Signs BP 136/62 (BP Location: Right Arm)   Pulse 77   Temp 98.3 F (36.8 C)  Resp 18   Ht '4\' 10"'$  (1.473 m)   Wt 72.6 kg   SpO2 96%   BMI 33.44 kg/m   Physical Exam Vitals and nursing note reviewed.  Constitutional:      General: She is not in acute distress.    Appearance: She is well-developed. She is not ill-appearing, toxic-appearing or diaphoretic.  HENT:     Head: Normocephalic and atraumatic.  Eyes:     Extraocular Movements: Extraocular movements intact.     Conjunctiva/sclera: Conjunctivae normal.     Pupils: Pupils are equal, round, and reactive to light.  Cardiovascular:     Rate and Rhythm: Normal rate and regular rhythm.     Heart sounds: No murmur heard. Pulmonary:     Effort: Pulmonary effort is normal. No tachypnea or respiratory distress.     Breath sounds: Normal breath sounds. No decreased breath sounds, wheezing, rhonchi or rales.  Chest:     Chest wall: Tenderness present.  Abdominal:     Palpations: Abdomen is soft.     Tenderness: There is no abdominal tenderness.  Musculoskeletal:     Cervical back: Neck supple.     Right lower leg: No tenderness. Edema present.     Left lower leg: No tenderness. Edema present.  Skin:    General: Skin is warm and dry.     Capillary Refill: Capillary refill takes less than 2 seconds.  Neurological:     General: No focal deficit present.     Mental Status: She is alert.  Psychiatric:        Mood and Affect: Mood normal.    ED Results / Procedures / Treatments   Labs (all labs ordered are listed, but only  abnormal results are displayed) Labs Reviewed  BASIC METABOLIC PANEL - Abnormal; Notable for the following components:      Result Value   Glucose, Bld 210 (*)    All other components within normal limits  D-DIMER, QUANTITATIVE - Abnormal; Notable for the following components:   D-Dimer, Quant 0.72 (*)    All other components within normal limits  BRAIN NATRIURETIC PEPTIDE - Abnormal; Notable for the following components:   B Natriuretic Peptide 145.6 (*)    All other components within normal limits  TROPONIN I (HIGH SENSITIVITY) - Abnormal; Notable for the following components:   Troponin I (High Sensitivity) 18 (*)    All other components within normal limits  CBC  TROPONIN I (HIGH SENSITIVITY)    EKG EKG Interpretation  Date/Time:  Wednesday August 17 2021 18:40:57 EDT Ventricular Rate:  75 PR Interval:  164 QRS Duration: 132 QT Interval:  398 QTC Calculation: 444 R Axis:   -74 Text Interpretation: Normal sinus rhythm Left axis deviation Non-specific intra-ventricular conduction block Minimal voltage criteria for LVH, may be normal variant ( Cornell product ) Cannot rule out Anterior infarct Abnormal ECG When compared to prior, faster rate. No STEMI Confirmed by Antony Blackbird [phone]) on 08/17/2021 6:57:40 PM  Radiology DG Chest 2 View  Result Date: 08/17/2021 CLINICAL DATA:  Shortness of breath with back pain and shoulder pain. EXAM: CHEST - 2 VIEW COMPARISON:  September 07, 2019 FINDINGS: Mild, diffuse, chronic appearing increased lung markings are seen without evidence of acute infiltrate, pleural effusion or pneumothorax. The heart size and mediastinal contours are within normal limits. Radiopaque surgical clips are seen within the right upper quadrant. The visualized skeletal structures are unremarkable. IMPRESSION: No active cardiopulmonary disease. Electronically Signed   By: Virgina Norfolk  M.D.   On: 08/17/2021 19:36    Procedures Procedures   Medications Ordered  in ED Medications  iohexol (OMNIPAQUE) 350 MG/ML injection 100 mL (75 mLs Intravenous Contrast Given 08/17/21 2341)    ED Course  I have reviewed the triage vital signs and the nursing notes.  Pertinent labs & imaging results that were available during my care of the patient were reviewed by me and considered in my medical decision making (see chart for details).    MDM Rules/Calculators/A&P                           Maleiya A Gan is a 80 y.o. female with a past medical history significant for chronic lymphedema, asthma, CAD, anxiety, and chronic chest pain who presents with chest discomfort.  Patient reports that for the last few days she has been having a chest tightness discomfort in her upper chest that goes across her chest.  She reports it is worse when she lays flat and is better when she is sitting up.  She reports it is not pleuritic or exertional and denied trauma.  She reports her legs are always edematous and do not seem different.  She denies fevers, chills, ingestion, cough.  She reports it is moderate discomfort but is currently improved.  She denies other complaints.  On exam, lungs clear and chest is mildly tender to palpation.  Abdomen nontender.  Legs have mild edema but otherwise are nontender and she reports are at her baseline.  Intact pulses throughout.  EKG does not show STEMI.  Patient had work-up to evaluate for etiology of symptoms.  Her BNP was slightly elevated however this may be chronic given her chronic edema.  X-ray did not show evidence of fluid overload and her lungs were clear without significant rales.  D-dimer was elevated.  We had a shared decision-making conversation about age adjustment and further imaging and patient would like to get a CT scan.  CT PE was completed with no evidence of pulmonary embolism.  Other work-up was overall reassuring including negative troponin which were both improved from prior.  Clinically I suspect some musculoskeletal  discomfort now we have ruled out some of the other concerning causes of her discomfort.  Patient feels comfortable going home and calling her cardiologist for close follow-up as well as her PCP.  She understood return precautions and had no other questions or concerns.  Patient discharged in good condition.    Final Clinical Impression(s) / ED Diagnoses Final diagnoses:  Chest tightness  Elevated brain natriuretic peptide (BNP) level  Chest pain, unspecified type    Rx / DC Orders ED Discharge Orders     None       Clinical Impression: 1. Chest tightness   2. Elevated brain natriuretic peptide (BNP) level   3. Chest pain, unspecified type     Disposition: Discharge  Condition: Good  I have discussed the results, Dx and Tx plan with the pt(& family if present). He/she/they expressed understanding and agree(s) with the plan. Discharge instructions discussed at great length. Strict return precautions discussed and pt &/or family have verbalized understanding of the instructions. No further questions at time of discharge.    Discharge Medication List as of 08/18/2021 12:05 AM      Follow Up: Jettie Booze, NP [place] [address] [place] [address] [phone]     your cardiologist     [facility]  [address] [place] [phone]       Ardys Hataway, Gwenyth Allegra, MD 08/18/21 Shelah Lewandowsky

## 2021-08-17 NOTE — ED Triage Notes (Addendum)
Patient is not sure if it is her lungs or her chest. She stated that when she lays down, she is short of breath, back pain and shoulder pain and chest pain in the last 2 nights.

## 2021-08-18 NOTE — Discharge Instructions (Addendum)
Your work-up today was overall reassuring.  Your troponin was improved from the last time it was checked in regards to your heart enzymes.  Your D-dimer was elevated as we discussed so we did the PE study which did not show evidence of blood clot.  Your BNP was slightly elevated likely reflecting the mild edema in your legs chronically but otherwise your work-up was overall reassuring.  We feel you are safe for discharge home.  Please follow-up with your primary doctor and cardiologist.  If any symptoms change or worsen, please return to the nearest emergency department.

## 2021-08-24 ENCOUNTER — Other Ambulatory Visit: Payer: Self-pay | Admitting: Cardiology

## 2021-09-19 ENCOUNTER — Encounter (HOSPITAL_BASED_OUTPATIENT_CLINIC_OR_DEPARTMENT_OTHER): Payer: Self-pay | Admitting: *Deleted

## 2021-09-19 ENCOUNTER — Emergency Department (HOSPITAL_BASED_OUTPATIENT_CLINIC_OR_DEPARTMENT_OTHER): Payer: Medicare Other | Admitting: Radiology

## 2021-09-19 ENCOUNTER — Other Ambulatory Visit: Payer: Self-pay

## 2021-09-19 ENCOUNTER — Emergency Department (HOSPITAL_BASED_OUTPATIENT_CLINIC_OR_DEPARTMENT_OTHER)
Admission: EM | Admit: 2021-09-19 | Discharge: 2021-09-19 | Disposition: A | Discharge status: Home / Self Care | Payer: Medicare Other | Attending: Emergency Medicine | Admitting: Emergency Medicine

## 2021-09-19 DIAGNOSIS — Z859 Personal history of malignant neoplasm, unspecified: Secondary | ICD-10-CM | POA: Diagnosis not present

## 2021-09-19 DIAGNOSIS — Z8616 Personal history of COVID-19: Secondary | ICD-10-CM | POA: Diagnosis not present

## 2021-09-19 DIAGNOSIS — Z7982 Long term (current) use of aspirin: Secondary | ICD-10-CM | POA: Insufficient documentation

## 2021-09-19 DIAGNOSIS — J4541 Moderate persistent asthma with (acute) exacerbation: Secondary | ICD-10-CM | POA: Diagnosis not present

## 2021-09-19 DIAGNOSIS — Z951 Presence of aortocoronary bypass graft: Secondary | ICD-10-CM | POA: Diagnosis not present

## 2021-09-19 DIAGNOSIS — R0602 Shortness of breath: Secondary | ICD-10-CM | POA: Diagnosis present

## 2021-09-19 DIAGNOSIS — I2510 Atherosclerotic heart disease of native coronary artery without angina pectoris: Secondary | ICD-10-CM | POA: Diagnosis not present

## 2021-09-19 DIAGNOSIS — Z96652 Presence of left artificial knee joint: Secondary | ICD-10-CM | POA: Insufficient documentation

## 2021-09-19 DIAGNOSIS — Z87891 Personal history of nicotine dependence: Secondary | ICD-10-CM | POA: Insufficient documentation

## 2021-09-19 DIAGNOSIS — R000 Tachycardia, unspecified: Secondary | ICD-10-CM | POA: Insufficient documentation

## 2021-09-19 HISTORY — DX: COVID-19: U07.1

## 2021-09-19 MED ORDER — ALBUTEROL SULFATE (2.5 MG/3ML) 0.083% IN NEBU
2.5000 mg | INHALATION_SOLUTION | Freq: Once | RESPIRATORY_TRACT | Status: AC
  Administered 2021-09-19: 2.5 mg via RESPIRATORY_TRACT
  Filled 2021-09-19: qty 3

## 2021-09-19 MED ORDER — PREDNISONE 50 MG PO TABS
60.0000 mg | ORAL_TABLET | Freq: Once | ORAL | Status: AC
  Administered 2021-09-19: 60 mg via ORAL
  Filled 2021-09-19: qty 1

## 2021-09-19 MED ORDER — PREDNISONE 10 MG PO TABS: 40.0000 mg | ORAL_TABLET | Freq: Every day | ORAL | 0 refills | Status: DC

## 2021-09-19 NOTE — ED Provider Notes (Addendum)
[facility] DEPT Provider Note   CSN: [id number] Arrival date & time: 09/19/21  1626     History Chief Complaint  Patient presents with   Asthma    Kristin Roach is a 80 y.o. female.  Patient had COVID about 6 weeks ago.  And since that time she has been struggling with shortness of breath.  Prior to that she has been known to have bronchial asthma.  Patient smoked about 60 years ago but has not smoked since.  Patient claims that she has had COVID more than once.  And each time her lungs have seemed of gotten worse.  Patient does have a nebulizer machine at home which she just recently got and has been using.  Patient not on steroids.  Patient has not followed up with pulmonary medicine.      Past Medical History:  Diagnosis Date   Angina at rest [facility])    Anxiety    Arthritis    Asthma    bronchial asthma   CAD (coronary artery disease)    Cancer ([facility])    Cataracts, bilateral    Complication of anesthesia    woke up during back procedure   COVID    HOH (hard of hearing)    Lymphedema of both lower extremities    Pneumonia    hx of 2012   PONV (postoperative nausea and vomiting)    with knee surgery in 11 2015   RLS (restless legs syndrome)    Sleep apnea    cpap    Patient Active Problem List   Diagnosis Date Noted   Chest pain 09/07/2019   BPPV (benign paroxysmal positional vertigo), left 05/06/2019   Restless leg syndrome 12/25/2018   Bilateral sensorineural hearing loss 03/06/2018   OSA (obstructive sleep apnea) 03/06/2018   Mild intermittent asthma without complication 88/41/6606   Anxiety 03/31/2017   Choroidal nevus of left eye 02/01/2017   Cortical cataract of both eyes 02/01/2017   Lymphedema 10/16/2016   Localized edema 08/21/2016   Chronic pain of both lower extremities 05/05/2016   Dry eye 05/02/2016   Glaucoma suspect of both eyes 05/02/2016   Arthritis of left knee Primary 10/26/2014    Past Surgical History:   Procedure Laterality Date   ABDOMINAL HYSTERECTOMY     BACK SURGERY     laminectomy   BELPHAROPTOSIS REPAIR     CARDIAC CATHETERIZATION     CESAREAN SECTION     CHOLECYSTECTOMY     COLONOSCOPY  2014   sigmoid hyperplastic polyps   HERNIA REPAIR     SHOULDER SURGERY     removal of bone spur   TONSILLECTOMY     TOTAL KNEE ARTHROPLASTY Left 10/26/2014   Procedure: LEFT TOTAL KNEE ARTHROPLASTY;  Surgeon: Kerin Salen, MD;  Location: [facility];  Service: Orthopedics;  Laterality: Left;     OB History     Gravida  5   Para  3   Term      Preterm      AB      Living         SAB      IAB      Ectopic      Multiple      Live Births              Family History  Problem Relation Age of Onset   Heart attack Mother        (died at 79 from MI)  Leukemia Father    Breast cancer Sister 35   Breast cancer Paternal Grandmother     Social History   Tobacco Use   Smoking status: Former    Types: Cigarettes   Smokeless tobacco: Never  Vaping Use   Vaping Use: Never used  Substance Use Topics   Alcohol use: Not Currently    Alcohol/week: 14.0 standard drinks    Types: 14 Glasses of wine per week   Drug use: No    Home Medications Prior to Admission medications   Medication Sig Start Date End Date Taking? Authorizing Provider  predniSONE (DELTASONE) 10 MG tablet Take 4 tablets (40 mg total) by mouth daily. 09/19/21  Yes Fredia Sorrow, MD  aspirin 81 MG tablet Take 81 mg by mouth daily.    [provider]  citalopram (CELEXA) 10 MG tablet Take 10 mg by mouth daily. 10/14/20   [provider]  diazepam (VALIUM) 5 MG tablet Take 5 mg by mouth as needed for anxiety (when she takes nitroglycerin).  06/13/17   [provider]  HYDROcodone-acetaminophen (NORCO) 7.5-325 MG tablet Take 1 tablet by mouth as needed for moderate pain. Pt takes 1 tablet by mouth 4(four) times a day as needed.    [provider]  hydrocortisone 2.5 %  cream Apply topically. 04/15/21   [provider]  methocarbamol (ROBAXIN) 500 MG tablet Take 1 tablet by mouth every six hours as needed for spasms 01/17/21   [provider]  nitroGLYCERIN (NITROSTAT) 0.4 MG SL tablet Place 1 tablet (0.4 mg total) under the tongue every 5 (five) minutes as needed for chest pain. 02/09/21   Buford Dresser, MD  pantoprazole (PROTONIX) 40 MG tablet Take 1 tablet (40 mg total) by mouth daily. Take 30 minutes before breakfast. 03/16/21   Gatha Mayer, MD  rOPINIRole (REQUIP) 1 MG tablet Take 1 mg by mouth at bedtime.  09/23/19   [provider]  rosuvastatin (CRESTOR) 40 MG tablet TAKE ONE TABLET BY MOUTH DAILY 08/25/21   Buford Dresser, MD    Allergies    Epinephrine, Ergonovine, Demerol [meperidine], Erythromycin, Flagyl [metronidazole], and Percocet [oxycodone-acetaminophen]  Review of Systems   Review of Systems  Constitutional:  Negative for chills and fever.  HENT:  Negative for ear pain and sore throat.   Eyes:  Negative for pain and visual disturbance.  Respiratory:  Positive for cough and shortness of breath.   Cardiovascular:  Negative for chest pain and palpitations.  Gastrointestinal:  Negative for abdominal pain and vomiting.  Genitourinary:  Negative for dysuria and hematuria.  Musculoskeletal:  Negative for arthralgias and back pain.  Skin:  Negative for color change and rash.  Neurological:  Negative for seizures and syncope.  All other systems reviewed and are negative.  Physical Exam Updated Vital Signs BP (!) 124/51   Pulse 75   Temp 98.7 F (37.1 C) (Oral)   Resp 18   Ht 1.473 m (4\' 10" )   Wt 72.6 kg   SpO2 92%   BMI 33.44 kg/m   Physical Exam Vitals and nursing note reviewed.  Constitutional:      General: She is not in acute distress.    Appearance: Normal appearance. She is well-developed.  HENT:     Head: Normocephalic and atraumatic.  Eyes:     Conjunctiva/sclera: Conjunctivae  normal.     Pupils: Pupils are equal, round, and reactive to light.  Cardiovascular:     Rate and Rhythm: Regular rhythm. Tachycardia present.  Heart sounds: No murmur heard. Pulmonary:     Effort: Pulmonary effort is normal. No respiratory distress.     Breath sounds: No stridor. Wheezing present. No rhonchi or rales.  Abdominal:     Palpations: Abdomen is soft.     Tenderness: There is no abdominal tenderness.  Musculoskeletal:        General: No swelling. Normal range of motion.     Cervical back: Normal range of motion and neck supple.  Skin:    General: Skin is warm and dry.     Capillary Refill: Capillary refill takes less than 2 seconds.  Neurological:     General: No focal deficit present.     Mental Status: She is alert and oriented to person, place, and time.    ED Results / Procedures / Treatments   Labs (all labs ordered are listed, but only abnormal results are displayed) Labs Reviewed - No data to display  EKG None  Radiology DG Chest 2 View  Result Date: 09/19/2021 CLINICAL DATA:  Chest pressure for the past 2-3 weeks. EXAM: CHEST - 2 VIEW COMPARISON:  Chest x-ray dated August 17, 2021. FINDINGS: The heart size and mediastinal contours are within normal limits. Both lungs are clear. The visualized skeletal structures are unremarkable. IMPRESSION: No active cardiopulmonary disease. Electronically Signed   By: Titus Dubin M.D.   On: 09/19/2021 17:42    Procedures Procedures   Medications Ordered in ED Medications  predniSONE (DELTASONE) tablet 60 mg (60 mg Oral Given 09/19/21 2130)  albuterol (PROVENTIL) (2.5 MG/3ML) 0.083% nebulizer solution 2.5 mg (2.5 mg Nebulization Given 09/19/21 2130)    ED Course  I have reviewed the triage vital signs and the nursing notes.  Pertinent labs & imaging results that were available during my care of the patient were reviewed by me and considered in my medical decision making (see chart for details).    MDM  Rules/Calculators/A&P                           Patient with bilateral wheezing.  Patient given albuterol nebulizer here and 60 mg of prednisone.  Wheezing now just faint on the right side.  Patient markedly improved.  Feels much better.  Think patient can be discharged home on a 5-day course of prednisone and continue her nebulizer treatments at home.  Chest x-ray here negative.  Do not think this is pulmonary embolus.  Patient initially had a heart rate of 108.  But the other heart rates were all in the 70s.  Patient with the bilateral wheezing improved significantly.  Will have patient follow-up with pulmonary medicine.  Feel that this is probably exacerbation of her asthma some worsening due to her COVID illness 6 weeks ago.   Final Clinical Impression(s) / ED Diagnoses Final diagnoses:  Moderate persistent asthma with acute exacerbation    Rx / DC Orders ED Discharge Orders          Ordered    predniSONE (DELTASONE) 10 MG tablet  Daily        09/19/21 2321             Fredia Sorrow, MD 09/19/21 4259    Fredia Sorrow, MD 09/19/21 2336

## 2021-09-19 NOTE — ED Triage Notes (Signed)
Wheezing with cough since last Wednesday, was seen here 6 weeks ago with Covid and similar symptoms. Clear secretions with able to bring secretions out.

## 2021-09-19 NOTE — ED Notes (Signed)
Patient verbalizes understanding of discharge instructions. Opportunity for questioning and answers were provided. Armband removed by staff, pt discharged from ED. Ambulated out to lobby  

## 2021-09-19 NOTE — Discharge Instructions (Addendum)
Use your albuterol nebulizer every 6 hours.  Take the prednisone as directed for the next 5 days.  Call and make an appointment to follow-up with pulmonary medicine information provided above.  Return for any new or worse symptoms.

## 2021-09-19 NOTE — ED Notes (Signed)
RT educated pt on asthma and importance of proper medication administration. Pt educated on proper techniques for better deposition.

## 2021-09-21 NOTE — Progress Notes (Signed)
Synopsis: Referred for possible asthma by Jettie Booze, NP  Subjective:   PATIENT ID: Kristin Roach GENDER: female DOB: 1941-04-26, MRN: [id number]  Chief Complaint  Patient presents with   Consult    Patient reports that she had Covid 6 weeks ago and she reports that it has flared her asthma.      80yF with history of nCAD, angina, covid in August 2022, OSA on CPAP who is referred for possible asthma.  Had covid-19 6-8 weeks ago. Not hospitalized for it. Has had productive cough more or less since then. Seen in [place] 09/19/21 for suspected asthma exacerbation and sent with prednisone taper. COugh has improved since starting taper. No fever. Hasn't used nebulizer today but recently it's been q6h. Only instance this year she's needed prednisone. Rare heartburn. She is taking zyrtec for postnasal drainage and sinonasal congestion.   Smoke may be a trigger  She is concerned about water damage to her apartment.   Does not appear to have gained any weight this year. No substantial orthopnea.    Otherwise pertinent review of systems is negative.  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 She has no pets. She has no hot tub. She has lived in [place], [place], born in [place] otherwise in [place].   Has only tried albuterol - no other inhalers.   Past Medical History:  Diagnosis Date   Angina at rest [facility])    Anxiety    Arthritis    Asthma    bronchial asthma   CAD (coronary artery disease)    Cancer (HCC)    Cataracts, bilateral    Complication of anesthesia    woke up during back procedure   COVID    HOH (hard of hearing)    Lymphedema of both lower extremities    Pneumonia    hx of 2012   PONV (postoperative nausea and vomiting)    with knee surgery in 11 2015   RLS (restless legs syndrome)    Sleep apnea    cpap     Family History  Problem Relation Age of Onset   Heart  attack Mother        (died at 35 from MI)   Leukemia Father    Acute lymphoblastic leukemia Father    Breast cancer Sister 15   Breast cancer Paternal Grandmother      Past Surgical History:  Procedure Laterality Date   ABDOMINAL HYSTERECTOMY     BACK SURGERY     laminectomy   BELPHAROPTOSIS REPAIR     CARDIAC CATHETERIZATION     CESAREAN SECTION     CHOLECYSTECTOMY     COLONOSCOPY  2014   sigmoid hyperplastic polyps   HERNIA REPAIR     SHOULDER SURGERY     removal of bone spur   TONSILLECTOMY     TOTAL KNEE ARTHROPLASTY Left 10/26/2014   Procedure: LEFT TOTAL KNEE ARTHROPLASTY;  Surgeon: Kerin Salen, MD;  Location: [facility];  Service: Orthopedics;  Laterality: Left;    Social History   Socioeconomic History   Marital status: Widowed    Spouse name: Not on file   Number of children: Not on file   Years of education: Not on file   Highest education level: Not on file  Occupational History   Not on file  Tobacco Use   Smoking status: Former  Types: Cigarettes   Smokeless tobacco: Never  Vaping Use   Vaping Use: Never used  Substance and Sexual Activity   Alcohol use: Not Currently    Alcohol/week: 14.0 standard drinks    Types: 14 Glasses of wine per week   Drug use: No   Sexual activity: Not on file  Other Topics Concern   Not on file  Social History Narrative   Widow   1 son Lyanne Co) and 1 daughter (another son - twin boy, died in childhood)   Has helped granddaughter with alcoholism, daughter has had methamphetamine addiction   Former smoker   1 caffeine/day   No etOH   Social Determinants of Radio broadcast assistant Strain: Not on file  Food Insecurity: Not on file  Transportation Needs: Not on file  Physical Activity: Not on file  Stress: Not on file  Social Connections: Not on file  Intimate Partner Violence: Not on file     Allergies  Allergen Reactions   Epinephrine Anaphylaxis   Ergonovine Other (See Comments)    Used in heart  study Reaction: Throat closed up    Flagyl [Metronidazole] Hives    Large amounts   Demerol [Meperidine] Itching   Erythromycin Nausea And Vomiting   Percocet [Oxycodone-Acetaminophen] Other (See Comments)    nightmares     Outpatient Medications Prior to Visit  Medication Sig Dispense Refill   albuterol (PROVENTIL) (2.5 MG/3ML) 0.083% nebulizer solution Take 3 mLs (2.5 mg dose) by nebulization every 6 (six) hours as needed for Wheezing.     aspirin 81 MG tablet Take 81 mg by mouth daily.     citalopram (CELEXA) 10 MG tablet Take 10 mg by mouth daily.     diazepam (VALIUM) 5 MG tablet Take 5 mg by mouth as needed for anxiety (when she takes nitroglycerin).      HYDROcodone-acetaminophen (NORCO) 7.5-325 MG tablet Take 1 tablet by mouth as needed for moderate pain. Pt takes 1 tablet by mouth 4(four) times a day as needed.     hydrocortisone 2.5 % cream Apply topically.     methocarbamol (ROBAXIN) 500 MG tablet Take 1 tablet by mouth every six hours as needed for spasms     Misc. Devices MISC Nebulizer for home use with tubing and mouthpiece/pipe or mask Dx: wheezing, shortness of breath post COvid     nitroGLYCERIN (NITROSTAT) 0.4 MG SL tablet Place 1 tablet (0.4 mg total) under the tongue every 5 (five) minutes as needed for chest pain. 25 tablet 3   pantoprazole (PROTONIX) 40 MG tablet Take 1 tablet (40 mg total) by mouth daily. Take 30 minutes before breakfast. 90 tablet 3   predniSONE (DELTASONE) 10 MG tablet Take 4 tablets (40 mg total) by mouth daily. 20 tablet 0   rOPINIRole (REQUIP) 1 MG tablet Take 1 mg by mouth at bedtime.      rosuvastatin (CRESTOR) 40 MG tablet TAKE ONE TABLET BY MOUTH DAILY 90 tablet 0   azithromycin (ZITHROMAX) 250 MG tablet Take 2 tablets (500 mg) on  Day 1,  followed by 1 tablet (250 mg) once daily on Days 2 through 5.     No facility-administered medications prior to visit.       Objective:   Physical Exam:  General appearance: 80 y.o., female,  NAD, conversant  Eyes: anicteric sclerae, moist conjunctivae; no lid-lag; PERRL, tracking appropriately HENT: NCAT; oropharynx, MMM, no mucosal ulcerations; normal hard and soft palate Neck: Trachea midline; no lymphadenopathy, no JVD  Lungs: Very faint wheeze L>R, no crackles, no wheeze, with normal respiratory effort CV: RRR, no MRGs  Abdomen: Soft, non-tender; non-distended, BS present  Extremities: No peripheral edema, radial and DP pulses present bilaterally  Skin: Normal temperature, turgor and texture; no rash Psych: Appropriate affect Neuro: Alert and oriented to person and place, no focal deficit    Vitals:   09/22/21 1531  BP: 120/68  Pulse: 66  Temp: 97.8 F (36.6 C)  TempSrc: Oral  SpO2: 95%  Weight: 169 lb (76.7 kg)  Height: 4\' 10"  (1.473 m)   95% on RA BMI Readings from Last 3 Encounters:  09/22/21 35.32 kg/m  09/19/21 33.44 kg/m  08/17/21 33.44 kg/m   Wt Readings from Last 3 Encounters:  09/22/21 169 lb (76.7 kg)  09/19/21 160 lb (72.6 kg)  08/17/21 160 lb (72.6 kg)     CBC    Component Value Date/Time   WBC 8.4 08/17/2021 1857   RBC 4.26 08/17/2021 1857   HGB 12.1 08/17/2021 1857   HCT 38.3 08/17/2021 1857   PLT 260 08/17/2021 1857   MCV 89.9 08/17/2021 1857   MCH 28.4 08/17/2021 1857   MCHC 31.6 08/17/2021 1857   RDW 14.0 08/17/2021 1857   LYMPHSABS 3.1 02/08/2021 1001   MONOABS 0.9 02/08/2021 1001   EOSABS 0.1 02/08/2021 1001   BASOSABS 0.1 02/08/2021 1001      Chest Imaging: CTA Chest 08/17/21 reviewed by me and remarkable for bronchial wall thickening, mosaicism, RV/LV of 1  CXR 09/19/21 reviewed by me and unchanged from prior  Pulmonary Functions Testing Results: No flowsheet data found.  Echocardiogram:   TTE 08/2019 with EF 40-45%      Assessment & Plan:   # Cough # Wheeze # Dyspnea Feeling better after course of steroids. May represent asthma or post viral bronchospasm. Although weight is stable and she attributes her  LE swelling to lymphedema, given elevated BNP alternative consideration could be decompensated heart failure.   Plan: - start advair HFA 115 2 puffs BID, rinse mouth afterward  - SABA prn - if under good control from asthma standpoint (if that's what this represents) at next visit, then plan to stop inhaler for a week, then obtain pre/post bronchodilator spirometry, lung volumes, and DLCO. (Lung volumes and DLCO as well due to recent history of covid) - TTE   RTC 6 weeks     Maryjane Hurter, MD [facility] 09/22/2021 4:09 PM

## 2021-09-22 ENCOUNTER — Ambulatory Visit (INDEPENDENT_AMBULATORY_CARE_PROVIDER_SITE_OTHER): Payer: Medicare Other | Admitting: Student

## 2021-09-22 ENCOUNTER — Other Ambulatory Visit: Payer: Self-pay

## 2021-09-22 ENCOUNTER — Encounter: Payer: Self-pay | Admitting: Student

## 2021-09-22 VITALS — BP 120/68 | HR 66 | Temp 97.8°F | Ht <= 58 in | Wt 169.0 lb

## 2021-09-22 DIAGNOSIS — I5022 Chronic systolic (congestive) heart failure: Secondary | ICD-10-CM

## 2021-09-22 DIAGNOSIS — J4521 Mild intermittent asthma with (acute) exacerbation: Secondary | ICD-10-CM

## 2021-09-22 MED ORDER — ADVAIR HFA 115-21 MCG/ACT IN AERO: 2.0000 | INHALATION_SPRAY | Freq: Two times a day (BID) | RESPIRATORY_TRACT | 12 refills | Status: DC

## 2021-09-22 NOTE — Patient Instructions (Addendum)
-   Call your insurer and ask what the most affordable LABA/ICS inhaler is for you (this is the class of inhaler we typically use for asthma) - For now, start advair 2 puffs twice daily, rinse mouth after use - see you in 6 weeks. If doing better, we'll try to come off your inhaler and then test you for asthma 1-2 weeks after that - you will be called to schedule echo

## 2021-09-23 ENCOUNTER — Telehealth (HOSPITAL_BASED_OUTPATIENT_CLINIC_OR_DEPARTMENT_OTHER): Payer: Self-pay | Admitting: *Deleted

## 2021-09-23 NOTE — Telephone Encounter (Signed)
Left message for patient to call and discuss scheduling the Echo ordered by Dr. Verlee Monte

## 2021-09-28 MED ORDER — PREDNISONE 10 MG PO TABS: ORAL_TABLET | ORAL | 0 refills | Status: AC

## 2021-09-28 NOTE — Telephone Encounter (Signed)
Sent prescription for another round of prednisone to [facility] on [address] while we try to figure out which inhaler she can afford. I'll send a message to pharmacy to see which ICS/LABA is most affordable. Can she also reach out to her insurer to ask them which ICS/LABA inhaler is best covered by them?

## 2021-09-28 NOTE — Telephone Encounter (Signed)
Call made to patient, confirmed DOB. She states the inhaler was $450. She reports she has completed the prednisone and she feels like her cough has come back with a vengeance. States her cough is not pink tinged and she can taste a little blood. Her chest is very sore. Her upper left back is also  hurting much more as well. She was not able to get the Advair. She states she is using Symbicort daily and albuterol nebulizer twice daily. Requesting recommendations.

## 2021-09-29 ENCOUNTER — Telehealth: Payer: Self-pay | Admitting: Student

## 2021-09-29 NOTE — Telephone Encounter (Signed)
Lm for patient's daughter, Jennifer(DPR).

## 2021-09-29 NOTE — Telephone Encounter (Signed)
Daughter called back at 4:59 pm  I called her back and again, there was no answer- LMTCB

## 2021-09-29 NOTE — Telephone Encounter (Signed)
Lm x2 for patient's daughter,

## 2021-09-29 NOTE — Telephone Encounter (Signed)
Please send in steroid taper as planned by Dr. Verlee Monte if not done so yet.  40mg  x 3 days 30mg  x 3 days 20mg  x 3 days 10mg  x 3 days  Also send in Augmentin 875mg  BID for 7 days.   If her symptoms continue or worsen, she should be seen in our clinic or go to the ER for further evaluation.   Freda Jackson, MD [facility] Office: [phone]   See Amion for personal pager PCCM on call pager [phone] until 7pm. Please call [facility] 7p-7a. [phone]

## 2021-09-29 NOTE — Telephone Encounter (Signed)
Spoke to patient's daughter, Jennifer(DPR). Anderson Malta stated that patient was dx with covid 07/2021. Patient has had a cough since.  She is now producing brownish to green sputum mixed with a tinge of orange to red blood. Blood is the the size of a dime and < teaspoon.This occurs everytime she coughs. This has been ongoing for 2 days.  She is using Albuterol solution TID. Not using Advair due to cost. Recommended that patient contact insurance for a list of alternative.  Dr. Verlee Monte prescribed prednisone taper yesterday, however patient was not aware and did not pickup Rx. After research it looks like Rx was printed.  Dr. Erin Fulling please advise, as Dr. Verlee Monte is unavailable. Thanks

## 2021-09-30 ENCOUNTER — Other Ambulatory Visit (HOSPITAL_COMMUNITY): Payer: Self-pay

## 2021-09-30 MED ORDER — PREDNISONE 10 MG PO TABS: ORAL_TABLET | ORAL | 0 refills | Status: DC

## 2021-09-30 MED ORDER — AMOXICILLIN-POT CLAVULANATE 875-125 MG PO TABS: 1.0000 | ORAL_TABLET | Freq: Two times a day (BID) | ORAL | 0 refills | Status: DC

## 2021-09-30 NOTE — Telephone Encounter (Signed)
Thanks [facility]!  Will await recommendations from Dr Verlee Monte.

## 2021-09-30 NOTE — Telephone Encounter (Signed)
Spoke with the pt and notified of response per Dr Erin Fulling. She verbalized understanding. Rxs sent to pharm. Nothing further needed.

## 2021-09-30 NOTE — Telephone Encounter (Signed)
Advair HFA is $438 per test claim Adair Patter is $415 per test claim  Patient can apply for [employer] patient assistance program - same company for both [place]. She must have spent $600 out-of-pocket on prescriptions this calendar year. If she has not done so, she does not qualify for the program. She has to have her local pharmacy print out how much she's spent out of pocket to submit with application  Alternatively, she can apply for [place] patient assistance program - Symbicort falls into this program.  She does not have to have spent any amount out of pocket to qualify.  Airduo is $32.33 through Goodrx at [facility] and $47.88 at [facility] - without patient assistance  Suit Saliva, PharmD, MPH, BCPS Clinical Pharmacist (Rheumatology and Pulmonology)

## 2021-10-05 ENCOUNTER — Other Ambulatory Visit: Payer: Self-pay

## 2021-10-05 ENCOUNTER — Ambulatory Visit (INDEPENDENT_AMBULATORY_CARE_PROVIDER_SITE_OTHER): Payer: Medicare Other

## 2021-10-05 DIAGNOSIS — I5022 Chronic systolic (congestive) heart failure: Secondary | ICD-10-CM

## 2021-10-05 LAB — ECHOCARDIOGRAM COMPLETE
AR max vel: 2.28 cm2
AV Area VTI: 2.17 cm2
AV Area mean vel: 2.27 cm2
AV Mean grad: 4.5 mmHg
AV Peak grad: 8.4 mmHg
Ao pk vel: 1.45 m/s
Area-P 1/2: 3.4 cm2
Calc EF: 55.8 %
S' Lateral: 2.94 cm
Single Plane A2C EF: 58.6 %
Single Plane A4C EF: 52.2 %

## 2021-10-09 ENCOUNTER — Other Ambulatory Visit: Payer: Self-pay

## 2021-10-09 ENCOUNTER — Emergency Department (HOSPITAL_COMMUNITY)
Admission: EM | Admit: 2021-10-09 | Discharge: 2021-10-09 | Disposition: A | Discharge status: Home / Self Care | Payer: Medicare Other | Attending: Emergency Medicine | Admitting: Emergency Medicine

## 2021-10-09 ENCOUNTER — Emergency Department (HOSPITAL_COMMUNITY): Payer: Medicare Other

## 2021-10-09 DIAGNOSIS — I509 Heart failure, unspecified: Secondary | ICD-10-CM | POA: Insufficient documentation

## 2021-10-09 DIAGNOSIS — R079 Chest pain, unspecified: Secondary | ICD-10-CM | POA: Insufficient documentation

## 2021-10-09 DIAGNOSIS — Z5321 Procedure and treatment not carried out due to patient leaving prior to being seen by health care provider: Secondary | ICD-10-CM | POA: Diagnosis not present

## 2021-10-09 LAB — CBC WITH DIFFERENTIAL/PLATELET
Abs Immature Granulocytes: 0.04 10*3/uL (ref 0.00–0.07)
Basophils Absolute: 0.1 10*3/uL (ref 0.0–0.1)
Basophils Relative: 1 %
Eosinophils Absolute: 0.1 10*3/uL (ref 0.0–0.5)
Eosinophils Relative: 1 %
HCT: 40.9 % (ref 36.0–46.0)
Hemoglobin: 12.4 g/dL (ref 12.0–15.0)
Immature Granulocytes: 0 %
Lymphocytes Relative: 11 %
Lymphs Abs: 1.3 10*3/uL (ref 0.7–4.0)
MCH: 28.2 pg (ref 26.0–34.0)
MCHC: 30.3 g/dL (ref 30.0–36.0)
MCV: 93.2 fL (ref 80.0–100.0)
Monocytes Absolute: 0.3 10*3/uL (ref 0.1–1.0)
Monocytes Relative: 3 %
Neutro Abs: 10.1 10*3/uL — ABNORMAL HIGH (ref 1.7–7.7)
Neutrophils Relative %: 84 %
Platelets: 241 10*3/uL (ref 150–400)
RBC: 4.39 MIL/uL (ref 3.87–5.11)
RDW: 14.2 % (ref 11.5–15.5)
WBC: 12 10*3/uL — ABNORMAL HIGH (ref 4.0–10.5)
nRBC: 0 % (ref 0.0–0.2)

## 2021-10-09 LAB — COMPREHENSIVE METABOLIC PANEL
ALT: 28 U/L (ref 0–44)
AST: 22 U/L (ref 15–41)
Albumin: 3.3 g/dL — ABNORMAL LOW (ref 3.5–5.0)
Alkaline Phosphatase: 73 U/L (ref 38–126)
Anion gap: 9 (ref 5–15)
BUN: 18 mg/dL (ref 8–23)
CO2: 26 mmol/L (ref 22–32)
Calcium: 8.9 mg/dL (ref 8.9–10.3)
Chloride: 103 mmol/L (ref 98–111)
Creatinine, Ser: 0.77 mg/dL (ref 0.44–1.00)
GFR, Estimated: >60 mL/min (ref >60)
Glucose, Bld: 225 mg/dL — ABNORMAL HIGH (ref 70–99)
Potassium: 4 mmol/L (ref 3.5–5.1)
Sodium: 138 mmol/L (ref 135–145)
Total Bilirubin: 0.5 mg/dL (ref 0.3–1.2)
Total Protein: 6 g/dL — ABNORMAL LOW (ref 6.5–8.1)

## 2021-10-09 LAB — TROPONIN I (HIGH SENSITIVITY): Troponin I (High Sensitivity): 33 ng/L — ABNORMAL HIGH (ref <18)

## 2021-10-09 MED ORDER — ASPIRIN 81 MG PO CHEW: 324.0000 mg | CHEWABLE_TABLET | Freq: Once | ORAL | Status: DC

## 2021-10-09 NOTE — ED Triage Notes (Signed)
Pt c/o CP "since yesterday," advised that she had a "very busy weekend w a wedding," afraid she "overdid it." Recent hx CHF (dx Thursday), hx angina, covid x3. Advises that heat helps pain

## 2021-10-09 NOTE — ED Provider Notes (Signed)
Emergency Medicine Provider Triage Evaluation Note  Kristin Roach , a 80 y.o. female  was evaluated in triage.  Pt complains of chest pain.  Reports that chest pain started yesterday.  Pain has been intermittent since then.  Pain has been present since waking this morning at 0 900.  Patient reports pain to left side of her chest, pain wraps around to her left flank and back.  Patient describes pain as achy.  Pain is worse with exertion, touch, and movement.  Patient has not tried any modalities to alleviate her symptoms.  No associated shortness of breath, nausea, vomiting, diaphoresis.  Patient reports that she has bilateral lower leg edema which is at baseline.  Review of Systems  Positive: Chest pain Negative: Shortness of breath, nausea, vomiting, palpitations, fever, chills, cough  Physical Exam  BP (!) 155/86   Pulse 80   Temp 98.9 F (37.2 C)   Resp 18   SpO2 95%  Gen:   Awake, no distress   Resp:  Normal effort, lungs clear to auscultation bilaterally MSK:   Moves extremities without difficulty  Other:  Minimal edema to bilateral lower extremities.  No tenderness to bilateral lower extremities.  Medical Decision Making  Medically screening exam initiated at 5:50 PM.  Appropriate orders placed.  Kristin Roach was informed that the remainder of the evaluation will be completed by another provider, this initial triage assessment does not replace that evaluation, and the importance of remaining in the ED until their evaluation is complete.  ACS work-up initiated.   Loni Beckwith, PA-C 10/09/21 Peever, Julie, MD 10/09/21 [phone]

## 2021-10-09 NOTE — ED Notes (Addendum)
Pt left due to wait time. Pt stated "I am just going to follow up with my cardiologist tomorrow"

## 2021-10-10 ENCOUNTER — Encounter (HOSPITAL_BASED_OUTPATIENT_CLINIC_OR_DEPARTMENT_OTHER): Payer: Self-pay

## 2021-10-11 ENCOUNTER — Telehealth: Payer: Self-pay | Admitting: Cardiology

## 2021-10-11 NOTE — Telephone Encounter (Signed)
Spoke with the pt  As stated below, the nitro helps slightly  She does not feel like the pred helped  I advised her to call cards or go to ED if pain not controlled and she verbalized understanding

## 2021-10-11 NOTE — Telephone Encounter (Signed)
Pt c/o of Chest Pain: STAT if CP now or developed within 24 hours  1. Are you having CP right now? yes  2. Are you experiencing any other symptoms (ex. SOB, nausea, vomiting, sweating)? sob  3. How long have you been experiencing CP? One week   4. Is your CP continuous or coming and going? Continuous   5. Have you taken Nitroglycerin? yes ?

## 2021-10-11 NOTE — Telephone Encounter (Signed)
Called patient no answer.Left message on personal voice mail to call back. 

## 2021-10-11 NOTE — Telephone Encounter (Signed)
Spoke with pt after receiving this email from her:  Kristin Roach, [facility] Now that we have a diagnosis what happens now. I am continual pain beginning in chest and going down under left arm and shoulder with stabbing angina stabbing pain in left shoulder. I keep taking nitro pills but don't know if that is good to do. It helps a little but really doesn't go away. I wrote Dr Harrell Gave but her office said to contact you. I don't have an appt with you until 11/15/21. Any advice. I went to [facility] last night and they did xray and blood work (on [address]) but I left after 3 hours as they said they coudn't see me for another 4 hrs. Kristin Roach [phone] Cough is all gone and so is Predisone.   She states that she started having the CP on 10/08/21 and it is a sharp, constant pain across her chest esp on the left side and it's esp worse when she lies down. She states the ED did not give her any recommendations. She took nitro "all day" and it helps some but not much. She had recent ECHO that she says shows some heart failure. She denies any new or worse edema. SOB is at baseline. No nausea/vomiting. She says she called cards and they said that she needed to call our office since we had ordered her ECHO. Please advise thanks!

## 2021-10-11 NOTE — Telephone Encounter (Signed)
Does the nitro help the pain? Did prednisone help the pain. Possible angina. Recommend cardiology evaluation vs return to ED if pain is uncontrolled.

## 2021-10-11 NOTE — Telephone Encounter (Signed)
Call transferred to triage.  Spoke with Pt.  Pt states she has had ongoing angina.    Per review of Pt chart, this is an ongoing issue.  She recently had a normal echo, but was under the impression that her pumping function was abnormal.  Advised her recent echo did not indicate heart failure.  Will schedule DOD visit 10/12/2021 for evaluation and reassurance.  Pt thanked nurse for call.

## 2021-10-12 ENCOUNTER — Ambulatory Visit (INDEPENDENT_AMBULATORY_CARE_PROVIDER_SITE_OTHER): Payer: Medicare Other | Admitting: Cardiology

## 2021-10-12 ENCOUNTER — Encounter: Payer: Self-pay | Admitting: Cardiology

## 2021-10-12 ENCOUNTER — Other Ambulatory Visit: Payer: Self-pay

## 2021-10-12 VITALS — BP 118/75 | HR 82 | Ht 64.0 in | Wt 174.6 lb

## 2021-10-12 DIAGNOSIS — R0989 Other specified symptoms and signs involving the circulatory and respiratory systems: Secondary | ICD-10-CM

## 2021-10-12 DIAGNOSIS — R0789 Other chest pain: Secondary | ICD-10-CM | POA: Diagnosis not present

## 2021-10-12 DIAGNOSIS — I25118 Atherosclerotic heart disease of native coronary artery with other forms of angina pectoris: Secondary | ICD-10-CM | POA: Diagnosis not present

## 2021-10-12 DIAGNOSIS — R7303 Prediabetes: Secondary | ICD-10-CM | POA: Diagnosis not present

## 2021-10-12 MED ORDER — ISOSORBIDE MONONITRATE ER 30 MG PO TB24: 30.0000 mg | ORAL_TABLET | Freq: Every day | ORAL | 3 refills | Status: DC

## 2021-10-12 MED ORDER — ISOSORBIDE MONONITRATE ER 30 MG PO TB24: 15.0000 mg | ORAL_TABLET | Freq: Every day | ORAL | 3 refills | Status: DC

## 2021-10-12 NOTE — Patient Instructions (Addendum)
Medication Instructions:  Your physician has recommended you make the following change in your medication:  START: Imdur 15 mg (half a tablet) once daily *If you need a refill on your cardiac medications before your next appointment, please call your pharmacy*   Lab Work: None If you have labs (blood work) drawn today and your tests are completely normal, you will receive your results only by: [place]  (if you have MyChart) OR A paper copy in the mail If you have any lab test that is abnormal or we need to change your treatment, we will call you to review the results.   Testing/Procedures: Your physician has requested that you have a lexiscan myoview. For further information please visit [email or web address]. Please follow instruction sheet, as given.   The test will take approximately 3 to 4 hours to complete; you may bring reading material.  If someone comes with you to your appointment, they will need to remain in the main lobby due to limited space in the testing area. **If you are pregnant or breastfeeding, please notify the nuclear lab prior to your appointment**  How to prepare for your Myocardial Perfusion Test: Do not eat or drink 3 hours prior to your test, except you may have water. Do not consume products containing caffeine (regular or decaffeinated) 12 hours prior to your test. (ex: coffee, chocolate, sodas, tea). Do bring a list of your current medications with you.  If not listed below, you may take your medications as normal. Do wear comfortable clothes (no dresses or overalls) and walking shoes, tennis shoes preferred (No heels or open toe shoes are allowed). Do NOT wear cologne, perfume, aftershave, or lotions (deodorant is allowed). If these instructions are not followed, your test will have to be rescheduled.    Follow-Up: At [facility], you and your health needs are our priority.  As part of our continuing mission to provide you with exceptional heart  care, we have created designated Provider Care Teams.  These Care Teams include your primary Cardiologist (physician) and Advanced Practice Providers (APPs -  Physician Assistants and Nurse Practitioners) who all work together to provide you with the care you need, when you need it.  We recommend signing up for the patient portal called "MyChart".  Sign up information is provided on this After Visit Summary.  MyChart is used to connect with patients for Virtual Visits (Telemedicine).  Patients are able to view lab/test results, encounter notes, upcoming appointments, etc.  Non-urgent messages can be sent to your provider as well.   To learn more about what you can do with MyChart, go to [email or web address].    Your next appointment:   4 month(s)  The format for your next appointment:   In Person  Provider:   Dr. Harrell Gave   Other Instructions  Cardiac Nuclear Scan A cardiac nuclear scan is a test that is done to check the flow of blood to your heart. It is done when you are resting and when you are exercising. The test looks for problems such as: Not enough blood reaching a portion of the heart. The heart muscle not working as it should. You may need this test if: You have heart disease. You have had lab results that are not normal. You have had heart surgery or a balloon procedure to open up blocked arteries (angioplasty). You have chest pain. You have shortness of breath. In this test, a special dye (tracer) is put into your bloodstream. The tracer will travel to your  heart. A camera will then take pictures of your heart to see how the tracer moves through your heart. This test is usually done at a hospital and takes 2-4 hours. Tell a doctor about: Any allergies you have. All medicines you are taking, including vitamins, herbs, eye drops, creams, and over-the-counter medicines. Any problems you or family members have had with anesthetic medicines. Any blood disorders you  have. Any surgeries you have had. Any medical conditions you have. Whether you are pregnant or may be pregnant. What are the risks? Generally, this is a safe test. However, problems may occur, such as: Serious chest pain and heart attack. This is only a risk if the stress portion of the test is done. Rapid heartbeat. A feeling of warmth in your chest. This feeling usually does not last long. Allergic reaction to the tracer. What happens before the test? Ask your doctor about changing or stopping your normal medicines. This is important. Follow instructions from your doctor about what you cannot eat or drink. Remove your jewelry on the day of the test. What happens during the test? An IV tube will be inserted into one of your veins. Your doctor will give you a small amount of tracer through the IV tube. You will wait for 20-40 minutes while the tracer moves through your bloodstream. Your heart will be monitored with an electrocardiogram (ECG). You will lie down on an exam table. Pictures of your heart will be taken for about 15-20 minutes. You may also have a stress test. For this test, one of these things may be done: You will be asked to exercise on a treadmill or a stationary bike. You will be given medicines that will make your heart work harder. This is done if you are unable to exercise. When blood flow to your heart has peaked, a tracer will again be given through the IV tube. After 20-40 minutes, you will get back on the exam table. More pictures will be taken of your heart. Depending on the tracer that is used, more pictures may need to be taken 3-4 hours later. Your IV tube will be removed when the test is over. The test may vary among doctors and hospitals. What happens after the test? Ask your doctor: Whether you can return to your normal schedule, including diet, activities, and medicines. Whether you should drink more fluids. This will help to remove the tracer from your  body. Drink enough fluid to keep your pee (urine) pale yellow. Ask your doctor, or the department that is doing the test: When will my results be ready? How will I get my results? Summary A cardiac nuclear scan is a test that is done to check the flow of blood to your heart. Tell your doctor whether you are pregnant or may be pregnant. Before the test, ask your doctor about changing or stopping your normal medicines. This is important. Ask your doctor whether you can return to your normal activities. You may be asked to drink more fluids. This information is not intended to replace advice given to you by your health care provider. Make sure you discuss any questions you have with your health care provider. Document Revised: 03/26/2019 Document Reviewed: 05/20/2018 Elsevier Patient Education  [place].

## 2021-10-12 NOTE — Progress Notes (Signed)
Cardiology Office Note:    Date:  10/12/2021   ID:  Kristin Roach, DOB 1941-04-25, MRN [id number]  PCP:  Jettie Booze, NP  Cardiologist:  Buford Dresser, MD  Electrophysiologist:  None   Referring MD: Jettie Booze, NP   " I am having chest pain"   History of Present Illness:    Kristin Roach is a 80 y.o. female with a hx of coronary artery disease seen on mild nonobstructive coronary CTA in 2020, asthma, arthritis, Prinzmetal angina who follows with Dr. Harrell Gave.  She was seen by Dr. Harrell Gave on May 25, 2021 at that time she was continued on her medication regimen.  Since that visit she has had echocardiogram showing for ejection fraction of 50 to 55% which was 40 to 45%.  The patient tells me that since her visit she has been experiencing intermittent chest discomfort.  She describes it as pressure-like sensation in her mid chest which radiates across her arms.  She notes that sometimes it is intermittent and sometimes it would last for about a day.  She has been taking nitroglycerin all day which seems to help but now the frequency is concerning as she feels that she is taking nitroglycerin every day and often.  She admits to having some associated shortness of breath but not always.  No lightheadedness or dizziness.   Of note per chart review Cardiac history: Patient was seen by Dr. Bayard Hugger in [place], [place] (Fax [phone]). Notes states: Heart murmur, mitral valve prolapse; sleep apnea, uses CPAP. Dr. Joaquim Lai at [facility] was her internal medicine physician. Thinks her last stress test was around 2012. Echo in 2017. Had prior heart caths in the early 1990s, did not require stents but does report that she had vasconstriction with ergotamine. Admitted 09/07/19 for chest pain. Seen by Dr. Irish Lack in the hospital. Had CT coronary with nonobstructive disease.    Past Medical History:  Diagnosis Date   Angina at rest [facility])    Anxiety    Arthritis    Asthma     bronchial asthma   CAD (coronary artery disease)    Cancer (HCC)    Cataracts, bilateral    Complication of anesthesia    woke up during back procedure   COVID    HOH (hard of hearing)    Lymphedema of both lower extremities    Pneumonia    hx of 2012   PONV (postoperative nausea and vomiting)    with knee surgery in 11 2015   RLS (restless legs syndrome)    Sleep apnea    cpap    Past Surgical History:  Procedure Laterality Date   ABDOMINAL HYSTERECTOMY     BACK SURGERY     laminectomy   BELPHAROPTOSIS REPAIR     CARDIAC CATHETERIZATION     CESAREAN SECTION     CHOLECYSTECTOMY     COLONOSCOPY  2014   sigmoid hyperplastic polyps   HERNIA REPAIR     SHOULDER SURGERY     removal of bone spur   TONSILLECTOMY     TOTAL KNEE ARTHROPLASTY Left 10/26/2014   Procedure: LEFT TOTAL KNEE ARTHROPLASTY;  Surgeon: Kerin Salen, MD;  Location: [facility];  Service: Orthopedics;  Laterality: Left;    Current Medications: Current Meds  Medication Sig   albuterol (PROVENTIL) (2.5 MG/3ML) 0.083% nebulizer solution Take 3 mLs (2.5 mg dose) by nebulization every 6 (six) hours as needed for Wheezing.   amoxicillin-clavulanate (AUGMENTIN) 875-125 MG tablet  Take 1 tablet by mouth 2 (two) times daily.   aspirin 81 MG tablet Take 81 mg by mouth daily.   citalopram (CELEXA) 10 MG tablet Take 10 mg by mouth daily.   diazepam (VALIUM) 5 MG tablet Take 5 mg by mouth as needed for anxiety (when she takes nitroglycerin).    fluticasone-salmeterol (ADVAIR HFA) 115-21 MCG/ACT inhaler Inhale 2 puffs into the lungs 2 (two) times daily.   HYDROcodone-acetaminophen (NORCO) 7.5-325 MG tablet Take 1 tablet by mouth as needed for moderate pain. Pt takes 1 tablet by mouth 4(four) times a day as needed.   hydrocortisone 2.5 % cream Apply topically.   methocarbamol (ROBAXIN) 500 MG tablet Take 1 tablet by mouth every six hours as needed for spasms   Misc. Devices MISC Nebulizer for home use with tubing and  mouthpiece/pipe or mask Dx: wheezing, shortness of breath post COvid   nitroGLYCERIN (NITROSTAT) 0.4 MG SL tablet Place 1 tablet (0.4 mg total) under the tongue every 5 (five) minutes as needed for chest pain.   pantoprazole (PROTONIX) 40 MG tablet Take 1 tablet (40 mg total) by mouth daily. Take 30 minutes before breakfast.   predniSONE (DELTASONE) 10 MG tablet 4 x 3 days, 3 x 3 days, 2 x 3 days, 1 x 3 days then stop   rOPINIRole (REQUIP) 1 MG tablet Take 1 mg by mouth at bedtime.    rosuvastatin (CRESTOR) 40 MG tablet TAKE ONE TABLET BY MOUTH DAILY   [DISCONTINUED] isosorbide mononitrate (IMDUR) 30 MG 24 hr tablet Take 1 tablet (30 mg total) by mouth daily.     Allergies:   Epinephrine, Ergonovine, Flagyl [metronidazole], Demerol [meperidine], Erythromycin, and Percocet [oxycodone-acetaminophen]   Social History   Socioeconomic History   Marital status: Widowed    Spouse name: Not on file   Number of children: Not on file   Years of education: Not on file   Highest education level: Not on file  Occupational History   Not on file  Tobacco Use   Smoking status: Former    Types: Cigarettes   Smokeless tobacco: Never  Vaping Use   Vaping Use: Never used  Substance and Sexual Activity   Alcohol use: Not Currently    Alcohol/week: 14.0 standard drinks    Types: 14 Glasses of wine per week   Drug use: No   Sexual activity: Not on file  Other Topics Concern   Not on file  Social History Narrative   Widow   1 son Lyanne Co) and 1 daughter (another son - twin boy, died in childhood)   Has helped granddaughter with alcoholism, daughter has had methamphetamine addiction   Former smoker   1 caffeine/day   No etOH   Social Determinants of Radio broadcast assistant Strain: Not on file  Food Insecurity: Not on file  Transportation Needs: Not on file  Physical Activity: Not on file  Stress: Not on file  Social Connections: Not on file     Family History: The patient's  family history includes Acute lymphoblastic leukemia in her father; Breast cancer in her paternal grandmother; Breast cancer (age of onset: 25) in her sister; Heart attack in her mother; Leukemia in her father.  ROS:   Review of Systems  Constitution: Negative for decreased appetite, fever and weight gain.  HENT: Negative for congestion, ear discharge, hoarse voice and sore throat.   Eyes: Negative for discharge, redness, vision loss in right eye and visual halos.  Cardiovascular: Negative for chest  pain, dyspnea on exertion, leg swelling, orthopnea and palpitations.  Respiratory: Negative for cough, hemoptysis, shortness of breath and snoring.   Endocrine: Negative for heat intolerance and polyphagia.  Hematologic/Lymphatic: Negative for bleeding problem. Does not bruise/bleed easily.  Skin: Negative for flushing, nail changes, rash and suspicious lesions.  Musculoskeletal: Negative for arthritis, joint pain, muscle cramps, myalgias, neck pain and stiffness.  Gastrointestinal: Negative for abdominal pain, bowel incontinence, diarrhea and excessive appetite.  Genitourinary: Negative for decreased libido, genital sores and incomplete emptying.  Neurological: Negative for brief paralysis, focal weakness, headaches and loss of balance.  Psychiatric/Behavioral: Negative for altered mental status, depression and suicidal ideas.  Allergic/Immunologic: Negative for HIV exposure and persistent infections.    EKGs/Labs/Other Studies Reviewed:    The following studies were reviewed today:    EKG:  The ekg ordered today demonstrates Sinus rhythm, heart rate 80 bpm with arrhythmia and underlying left bundle branch block, compared to EKG done in November 2021 bundle branch block is not new.  Echo 2022  IMPRESSIONS     1. Left ventricular ejection fraction, by estimation, is 50 to 55%. The  left ventricle has low normal function. The left ventricle has no regional  wall motion abnormalities.  Left ventricular diastolic parameters are  consistent with Grade I diastolic  dysfunction (impaired relaxation).   2. Right ventricular systolic function is normal. The right ventricular  size is normal.   3. The pericardial effusion is localized near the right atrium. There is  no evidence of cardiac tamponade.   4. The mitral valve is normal in structure. No evidence of mitral valve  regurgitation. No evidence of mitral stenosis.   5. The aortic valve is normal in structure. Aortic valve regurgitation is  not visualized. No aortic stenosis is present.   6. The inferior vena cava is normal in size with greater than 50%  respiratory variability, suggesting right atrial pressure of 3 mmHg.   FINDINGS   Left Ventricle: Left ventricular ejection fraction, by estimation, is 50  to 55%. The left ventricle has low normal function. The left ventricle has  no regional wall motion abnormalities. Global longitudinal strain  performed but not reported based on  interpreter judgement due to suboptimal tracking. The left ventricular  internal cavity size was normal in size. There is no left ventricular  hypertrophy. Left ventricular diastolic parameters are consistent with  Grade I diastolic dysfunction (impaired  relaxation). Normal left ventricular filling pressure.   Right Ventricle: The right ventricular size is normal. No increase in  right ventricular wall thickness. Right ventricular systolic function is  normal.   Left Atrium: Left atrial size was normal in size.   Right Atrium: Right atrial size was normal in size.   Pericardium: Trivial pericardial effusion is present. The pericardial  effusion is localized near the right atrium. There is no evidence of  cardiac tamponade.   Mitral Valve: The mitral valve is normal in structure. No evidence of  mitral valve regurgitation. No evidence of mitral valve stenosis.   Tricuspid Valve: The tricuspid valve is normal in structure.  Tricuspid  valve regurgitation is trivial. No evidence of tricuspid stenosis.   Aortic Valve: The aortic valve is normal in structure. Aortic valve  regurgitation is not visualized. No aortic stenosis is present. Aortic  valve mean gradient measures 4.5 mmHg. Aortic valve peak gradient measures  8.4 mmHg. Aortic valve area, by VTI  measures 2.17 cm.   Pulmonic Valve: The pulmonic valve was normal in structure.  Pulmonic valve  regurgitation is not visualized. No evidence of pulmonic stenosis.   Aorta: The aortic root is normal in size and structure.   Venous: The inferior vena cava is normal in size with greater than 50%  respiratory variability, suggesting right atrial pressure of 3 mmHg.   IAS/Shunts: No atrial level shunt detected by color flow Doppler.   Coronary CTA September 2020 COMPARISON:  None.   FINDINGS: Limited view of the lung parenchyma demonstrates no suspicious nodularity. Airways are normal.   Limited view of the mediastinum demonstrates no adenopathy. Esophagus normal.   Limited view of the upper abdomen unremarkable.   Limited view of the skeleton and chest wall is unremarkable.   IMPRESSION: No significant extracardiac findings.     Electronically Signed   By: Suzy Bouchard M.D.   On: 09/09/2019 08:40    Addended by Gus Height, MD on 09/09/2019  8:42 AM   Study Result  Narrative & Impression  CLINICAL DATA:  80 year old female with chest pain.   EXAM: Cardiac/Coronary  CTA   TECHNIQUE: The patient was scanned on a [employer].   FINDINGS: A 100 kV prospective scan was triggered in the descending thoracic aorta at 111 HU's. Axial non-contrast 3 mm slices were carried out through the heart. The data set was analyzed on a dedicated work station and scored using the [place]. Gantry rotation speed was 250 msecs and collimation was .6 mm. No beta blockade and 0.8 mg of sl NTG was given. The 3D data set was reconstructed in  5% intervals of the 67-82 % of the R-R cycle. Diastolic phases were analyzed on a dedicated work station using MPR, MIP and VRT modes. The patient received 80 cc of contrast.   Aorta:  Normal size.  Mild diffuse calcifications.  No dissection.   Aortic Valve:  Trileaflet.  Mild calcifications.   Coronary Arteries:  Normal coronary origin.  Right dominance.   RCA is a large dominant artery that gives rise to PDA and PLA. There are only minimal irregularities.   Left main is a large artery that gives rise to LAD and LCX arteries.   LAD is a large vessel that has mild calcified plaque in the proximal and mid portion with stenosis 25-49%.   LCX is a large non-dominant artery that gives rise to one large OM1 branch. There are only minimal irregularities.   OM1 is a very large branch that has no plaque.   Other findings:   Normal pulmonary vein drainage into the left atrium.   Normal left atrial appendage without a thrombus.   IMPRESSION: 1. Coronary calcium score of 208. This was 63 percentile for age and sex matched control.   2. Normal coronary origin with right dominance.   3. CAD-RADS 2. Mild non-obstructive CAD (25-49%). Consider non-atherosclerotic causes of chest pain. Consider preventive therapy and risk factor modification.   4. Mildly dilated pulmonary artery measuring 32 mm.      Recent Labs: 08/17/2021: B Natriuretic Peptide 145.6 10/09/2021: ALT 28; BUN 18; Creatinine, Ser 0.77; Hemoglobin 12.4; Platelets 241; Potassium 4.0; Sodium 138  Recent Lipid Panel    Component Value Date/Time   CHOL 131 04/26/2020 1610   TRIG 94 04/26/2020 1610   HDL 59 04/26/2020 1610   CHOLHDL 2.2 04/26/2020 1610   CHOLHDL 4.7 09/09/2019 0329   VLDL 18 09/09/2019 0329   LDLCALC 54 04/26/2020 1610    Physical Exam:    VS:  BP 118/75   Pulse  82   Ht 5\' 4"  (1.626 m)   Wt 174 lb 9.6 oz (79.2 kg)   SpO2 97%   BMI 29.97 kg/m     Wt Readings from Last 3 Encounters:   10/12/21 174 lb 9.6 oz (79.2 kg)  09/22/21 169 lb (76.7 kg)  09/19/21 160 lb (72.6 kg)     GEN: Well nourished, well developed in no acute distress HEENT: Normal NECK: No JVD; No carotid bruits LYMPHATICS: No lymphadenopathy CARDIAC: S1S2 noted,RRR, no murmurs, rubs, gallops RESPIRATORY:  Clear to auscultation without rales, wheezing or rhonchi  ABDOMEN: Soft, non-tender, non-distended, +bowel sounds, no guarding. EXTREMITIES: No edema, No cyanosis, no clubbing MUSCULOSKELETAL:  No deformity  SKIN: Warm and dry NEUROLOGIC:  Alert and oriented x 3, non-focal PSYCHIATRIC:  Normal affect, good insight  ASSESSMENT:    1. Coronary artery disease of native artery of native heart with stable angina pectoris ([place])   2. Other chest pain   3. Prediabetes   4. Labile blood pressure    PLAN:    Her symptoms is concerning for stable angina.  She has been tolerating nitroglycerin without headache unguinal place her on long-acting Imdur 15 mg daily.-Hoping that this small dose will help her with her anginal symptoms and not affect her blood pressure. I think it will be time to get a functional study on the patient therefore we discussed a Lexiscan to assess for any ischemia.  Agreeable to proceed with this.  All of her questions has been answered for the testing.  She was worried about her echocardiogram result as she notes that her pulmonary doctor told her her heart function had worsened, but I explained to the patient that on the contrary her EF had improved to 50 to 55% explained to her what it meant to have grade 1 diastolic dysfunction.  The patient understands the need to lose weight with diet and exercise. We have discussed specific strategies for this.   The patient is in agreement with the above plan. The patient left the office in stable condition.  The patient will follow up in 4 to 6 months with Dr. Harrell Gave.   Medication Adjustments/Labs and Tests Ordered: Current medicines  are reviewed at length with the patient today.  Concerns regarding medicines are outlined above.  Orders Placed This Encounter  Procedures   MYOCARDIAL PERFUSION IMAGING   EKG 12-Lead   Meds ordered this encounter  Medications   DISCONTD: isosorbide mononitrate (IMDUR) 30 MG 24 hr tablet    Sig: Take 1 tablet (30 mg total) by mouth daily.    Dispense:  90 tablet    Refill:  3   isosorbide mononitrate (IMDUR) 30 MG 24 hr tablet    Sig: Take 0.5 tablets (15 mg total) by mouth daily.    Dispense:  45 tablet    Refill:  3    Patient Instructions  Medication Instructions:  Your physician has recommended you make the following change in your medication:  START: Imdur 15 mg (half a tablet) once daily *If you need a refill on your cardiac medications before your next appointment, please call your pharmacy*   Lab Work: None If you have labs (blood work) drawn today and your tests are completely normal, you will receive your results only by: [place] (if you have MyChart) OR A paper copy in the mail If you have any lab test that is abnormal or we need to change your treatment, we will call you to review the results.  Testing/Procedures: Your physician has requested that you have a lexiscan myoview. For further information please visit [email or web address]. Please follow instruction sheet, as given.   The test will take approximately 3 to 4 hours to complete; you may bring reading material.  If someone comes with you to your appointment, they will need to remain in the main lobby due to limited space in the testing area. **If you are pregnant or breastfeeding, please notify the nuclear lab prior to your appointment**  How to prepare for your Myocardial Perfusion Test: Do not eat or drink 3 hours prior to your test, except you may have water. Do not consume products containing caffeine (regular or decaffeinated) 12 hours prior to your test. (ex: coffee, chocolate, sodas, tea). Do  bring a list of your current medications with you.  If not listed below, you may take your medications as normal. Do wear comfortable clothes (no dresses or overalls) and walking shoes, tennis shoes preferred (No heels or open toe shoes are allowed). Do NOT wear cologne, perfume, aftershave, or lotions (deodorant is allowed). If these instructions are not followed, your test will have to be rescheduled.    Follow-Up: At [facility], you and your health needs are our priority.  As part of our continuing mission to provide you with exceptional heart care, we have created designated Provider Care Teams.  These Care Teams include your primary Cardiologist (physician) and Advanced Practice Providers (APPs -  Physician Assistants and Nurse Practitioners) who all work together to provide you with the care you need, when you need it.  We recommend signing up for the patient portal called "MyChart".  Sign up information is provided on this After Visit Summary.  MyChart is used to connect with patients for Virtual Visits (Telemedicine).  Patients are able to view lab/test results, encounter notes, upcoming appointments, etc.  Non-urgent messages can be sent to your provider as well.   To learn more about what you can do with MyChart, go to [email or web address].    Your next appointment:   4 month(s)  The format for your next appointment:   In Person  Provider:   Dr. Harrell Gave   Other Instructions  Cardiac Nuclear Scan A cardiac nuclear scan is a test that is done to check the flow of blood to your heart. It is done when you are resting and when you are exercising. The test looks for problems such as: Not enough blood reaching a portion of the heart. The heart muscle not working as it should. You may need this test if: You have heart disease. You have had lab results that are not normal. You have had heart surgery or a balloon procedure to open up blocked arteries (angioplasty). You have  chest pain. You have shortness of breath. In this test, a special dye (tracer) is put into your bloodstream. The tracer will travel to your heart. A camera will then take pictures of your heart to see how the tracer moves through your heart. This test is usually done at a hospital and takes 2-4 hours. Tell a doctor about: Any allergies you have. All medicines you are taking, including vitamins, herbs, eye drops, creams, and over-the-counter medicines. Any problems you or family members have had with anesthetic medicines. Any blood disorders you have. Any surgeries you have had. Any medical conditions you have. Whether you are pregnant or may be pregnant. What are the risks? Generally, this is a safe test. However, problems may occur, such as: Serious chest pain  and heart attack. This is only a risk if the stress portion of the test is done. Rapid heartbeat. A feeling of warmth in your chest. This feeling usually does not last long. Allergic reaction to the tracer. What happens before the test? Ask your doctor about changing or stopping your normal medicines. This is important. Follow instructions from your doctor about what you cannot eat or drink. Remove your jewelry on the day of the test. What happens during the test? An IV tube will be inserted into one of your veins. Your doctor will give you a small amount of tracer through the IV tube. You will wait for 20-40 minutes while the tracer moves through your bloodstream. Your heart will be monitored with an electrocardiogram (ECG). You will lie down on an exam table. Pictures of your heart will be taken for about 15-20 minutes. You may also have a stress test. For this test, one of these things may be done: You will be asked to exercise on a treadmill or a stationary bike. You will be given medicines that will make your heart work harder. This is done if you are unable to exercise. When blood flow to your heart has peaked, a tracer  will again be given through the IV tube. After 20-40 minutes, you will get back on the exam table. More pictures will be taken of your heart. Depending on the tracer that is used, more pictures may need to be taken 3-4 hours later. Your IV tube will be removed when the test is over. The test may vary among doctors and hospitals. What happens after the test? Ask your doctor: Whether you can return to your normal schedule, including diet, activities, and medicines. Whether you should drink more fluids. This will help to remove the tracer from your body. Drink enough fluid to keep your pee (urine) pale yellow. Ask your doctor, or the department that is doing the test: When will my results be ready? How will I get my results? Summary A cardiac nuclear scan is a test that is done to check the flow of blood to your heart. Tell your doctor whether you are pregnant or may be pregnant. Before the test, ask your doctor about changing or stopping your normal medicines. This is important. Ask your doctor whether you can return to your normal activities. You may be asked to drink more fluids. This information is not intended to replace advice given to you by your health care provider. Make sure you discuss any questions you have with your health care provider. Document Revised: 03/26/2019 Document Reviewed: 05/20/2018 Elsevier Patient Education  2022 [employer].   Adopting a Healthy Lifestyle.  Know what a healthy weight is for you (roughly BMI <25) and aim to maintain this   Aim for 7+ servings of fruits and vegetables daily   65-80+ fluid ounces of water or unsweet tea for healthy kidneys   Limit to max 1 drink of alcohol per day; avoid smoking/tobacco   Limit animal fats in diet for cholesterol and heart health - choose grass fed whenever available   Avoid highly processed foods, and foods high in saturated/trans fats   Aim for low stress - take time to unwind and care for your mental  health   Aim for 150 min of moderate intensity exercise weekly for heart health, and weights twice weekly for bone health   Aim for 7-9 hours of sleep daily   When it comes to diets, agreement about the perfect  plan isnt easy to find, even among the experts. Experts at the [employer] developed an idea known as the Healthy Eating Plate. Just imagine a plate divided into logical, healthy portions.   The emphasis is on diet quality:   Load up on vegetables and fruits - one-half of your plate: Aim for color and variety, and remember that potatoes dont count.   Go for whole grains - one-quarter of your plate: Whole wheat, barley, wheat berries, quinoa, oats, brown rice, and foods made with them. If you want pasta, go with whole wheat pasta.   Protein power - one-quarter of your plate: Fish, chicken, beans, and nuts are all healthy, versatile protein sources. Limit red meat.   The diet, however, does go beyond the plate, offering a few other suggestions.   Use healthy plant oils, such as olive, canola, soy, corn, sunflower and peanut. Check the labels, and avoid partially hydrogenated oil, which have unhealthy trans fats.   If youre thirsty, drink water. Coffee and tea are good in moderation, but skip sugary drinks and limit milk and dairy products to one or two daily servings.   The type of carbohydrate in the diet is more important than the amount. Some sources of carbohydrates, such as vegetables, fruits, whole grains, and beans-are healthier than others.   Finally, stay active  Signed, Berniece Salines, DO  10/12/2021 4:01 PM    [facility]

## 2021-10-14 NOTE — Telephone Encounter (Signed)
Appointment completed with Dr. Harriet Masson (DOD) on 10/26.

## 2021-10-21 ENCOUNTER — Telehealth (HOSPITAL_COMMUNITY): Payer: Self-pay | Admitting: *Deleted

## 2021-10-21 NOTE — Telephone Encounter (Signed)
Close encounter 

## 2021-10-24 ENCOUNTER — Telehealth: Payer: Self-pay | Admitting: Cardiology

## 2021-10-24 NOTE — Telephone Encounter (Signed)
   Pt is scheduled for lexiscan on wednesday, she said she is have a lot of secretion (phlegm) and wondering if that ok to go through with her lexiscan

## 2021-10-24 NOTE — Telephone Encounter (Signed)
Spoke to patient she stated she has a bad cough and needs to reschedule Lexiscan.[place] appointment scheduled for 11/9 cancelled.Scheduler will call back tomorrow to reschedule.

## 2021-10-26 ENCOUNTER — Telehealth (HOSPITAL_COMMUNITY): Payer: Self-pay | Admitting: *Deleted

## 2021-10-26 ENCOUNTER — Ambulatory Visit (HOSPITAL_COMMUNITY)
Admission: RE | Admit: 2021-10-26 | Payer: Medicare Other | Source: Ambulatory Visit | Attending: Cardiology | Admitting: Cardiology

## 2021-10-26 NOTE — Telephone Encounter (Signed)
Close encounter 

## 2021-10-28 ENCOUNTER — Other Ambulatory Visit: Payer: Self-pay

## 2021-10-28 ENCOUNTER — Ambulatory Visit (HOSPITAL_COMMUNITY)
Admission: RE | Admit: 2021-10-28 | Discharge: 2021-10-28 | Disposition: A | Discharge status: Home / Self Care | Payer: Medicare Other | Source: Ambulatory Visit | Attending: Cardiology | Admitting: Cardiology

## 2021-10-28 DIAGNOSIS — R0789 Other chest pain: Secondary | ICD-10-CM | POA: Insufficient documentation

## 2021-10-28 LAB — MYOCARDIAL PERFUSION IMAGING
Base ST Depression (mm): 0 mm
LV dias vol: 88 mL (ref 46–106)
LV sys vol: 44 mL
Nuc Stress EF: 50 %
Peak HR: 100 {beats}/min
Rest HR: 81 {beats}/min
Rest Nuclear Isotope Dose: 8.2 mCi
SDS: 0
SRS: 5
SSS: 5
ST Depression (mm): 0 mm
Stress Nuclear Isotope Dose: 25.2 mCi
TID: 0.95

## 2021-10-28 MED ORDER — TECHNETIUM TC 99M TETROFOSMIN IV KIT
8.2000 | PACK | Freq: Once | INTRAVENOUS | Status: AC | PRN
  Administered 2021-10-28: 8.2 via INTRAVENOUS
  Filled 2021-10-28: qty 9

## 2021-10-28 MED ORDER — REGADENOSON 0.4 MG/5ML IV SOLN
0.4000 mg | Freq: Once | INTRAVENOUS | Status: AC
  Administered 2021-10-28: 0.4 mg via INTRAVENOUS

## 2021-10-28 MED ORDER — TECHNETIUM TC 99M TETROFOSMIN IV KIT
25.2000 | PACK | Freq: Once | INTRAVENOUS | Status: AC | PRN
  Administered 2021-10-28: 25.2 via INTRAVENOUS
  Filled 2021-10-28: qty 26

## 2021-10-31 NOTE — Telephone Encounter (Signed)
Called and discussed with patient. She'll try [employer]Me PAP first to see if she can get symbicort 80, then would try [place] PAP to see if she can get advair HFA 115 inhaler. If not then will go with [employer]o respiclick which is affordable enough to her with goodrx.

## 2021-11-02 ENCOUNTER — Telehealth: Payer: Self-pay

## 2021-11-02 ENCOUNTER — Telehealth: Payer: Self-pay | Admitting: Cardiology

## 2021-11-02 NOTE — Telephone Encounter (Signed)
Attempt to return call-left message to call back 

## 2021-11-02 NOTE — Telephone Encounter (Signed)
Called patient. Patient made aware of her stress test results. Verbalized understanding. No questions or concerns expressed at this time.

## 2021-11-02 NOTE — Telephone Encounter (Signed)
Patient returning call for stress test results. 

## 2021-11-02 NOTE — Telephone Encounter (Signed)
Call made to patient, confirmed DOB. Patient states she did fill out the patient assistance application and she is waiting to hear back to see if she is approved and she is also applying for assistance with symbicort as well. Patient made aware to give [place] a call if anything else is needed. Voiced understanding.   Nothing further needed at this time.

## 2021-11-15 ENCOUNTER — Ambulatory Visit (INDEPENDENT_AMBULATORY_CARE_PROVIDER_SITE_OTHER): Payer: Medicare Other | Admitting: Student

## 2021-11-15 ENCOUNTER — Encounter: Payer: Self-pay | Admitting: Student

## 2021-11-15 ENCOUNTER — Other Ambulatory Visit: Payer: Self-pay

## 2021-11-15 VITALS — BP 130/72 | HR 106 | Temp 97.9°F | Ht <= 58 in | Wt 172.2 lb

## 2021-11-15 DIAGNOSIS — R0609 Other forms of dyspnea: Secondary | ICD-10-CM | POA: Diagnosis not present

## 2021-11-15 DIAGNOSIS — R053 Chronic cough: Secondary | ICD-10-CM | POA: Diagnosis not present

## 2021-11-15 MED ORDER — FLUTICASONE-SALMETEROL 100-50 MCG/ACT IN AEPB: 1.0000 | INHALATION_SPRAY | Freq: Two times a day (BID) | RESPIRATORY_TRACT | 6 refills | Status: DC

## 2021-11-15 MED ORDER — MONTELUKAST SODIUM 10 MG PO TABS: 10.0000 mg | ORAL_TABLET | Freq: Every day | ORAL | 11 refills | Status: AC

## 2021-11-15 MED ORDER — FLUTICASONE PROPIONATE 50 MCG/ACT NA SUSP: 1.0000 | Freq: Every day | NASAL | 11 refills | Status: DC

## 2021-11-15 NOTE — Progress Notes (Signed)
Synopsis: Referred for possible asthma by Jettie Booze, NP  Subjective:   PATIENT ID: Kristin Roach GENDER: female DOB: [birth year], MRN: [id number]  Chief Complaint  Patient presents with   Follow-up    Pt states SOB     80yF with history of nCAD, angina, covid in August 2022, OSA on CPAP who is referred for possible asthma.  Had covid-19 6-8 weeks ago. Not hospitalized for it. Has had productive cough more or less since then. Seen in [place] 09/19/21 for suspected asthma exacerbation and sent with prednisone taper. COugh has improved since starting taper. No fever. Hasn't used nebulizer today but recently it's been q6h. Only instance this year she's needed prednisone. Rare heartburn. She is taking zyrtec for postnasal drainage and sinonasal congestion.   Smoke may be a trigger  She is concerned about water damage to her apartment.   Does not appear to have gained any weight this year. No substantial orthopnea.   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 She has no pets. She has no hot tub. She has lived in [place], [place], born in [place] otherwise in [place].   Has only tried albuterol - no other inhalers.   Interval HPI: Given course of steroids for possible asthma exacerbation in late September/early october  Negative stress test.  She is taking symbicort that she had from prior fill - she does not recall which dose this is. Advair was unfortunately too expensive even with goodrx (about $200)  She has near constant PND despite use of flonase but it is actually improved with use of flonase. .         Otherwise pertinent review of systems is negative.    Past Medical History:  Diagnosis Date   Angina at rest [facility])    Anxiety    Arthritis    Asthma    bronchial asthma   CAD (coronary artery disease)    Cancer (HCC)    Cataracts, bilateral    Complication of anesthesia     woke up during back procedure   COVID    HOH (hard of hearing)    Lymphedema of both lower extremities    Pneumonia    hx of 2012   PONV (postoperative nausea and vomiting)    with knee surgery in 11 2015   RLS (restless legs syndrome)    Sleep apnea    cpap     Family History  Problem Relation Age of Onset   Heart attack Mother        (died at 24 from MI)   Leukemia Father    Acute lymphoblastic leukemia Father    Breast cancer Sister 34   Breast cancer Paternal Grandmother      Past Surgical History:  Procedure Laterality Date   ABDOMINAL HYSTERECTOMY     BACK SURGERY     laminectomy   BELPHAROPTOSIS REPAIR     CARDIAC CATHETERIZATION     CESAREAN SECTION     CHOLECYSTECTOMY     COLONOSCOPY  2014   sigmoid hyperplastic polyps   HERNIA REPAIR     SHOULDER SURGERY     removal of bone spur   TONSILLECTOMY     TOTAL KNEE ARTHROPLASTY Left 10/26/2014   Procedure: LEFT TOTAL KNEE ARTHROPLASTY;  Surgeon: Kerin Salen, MD;  Location: [place];  Service: Orthopedics;  Laterality: Left;  Social History   Socioeconomic History   Marital status: Widowed    Spouse name: Not on file   Number of children: Not on file   Years of education: Not on file   Highest education level: Not on file  Occupational History   Not on file  Tobacco Use   Smoking status: Former    Types: Cigarettes   Smokeless tobacco: Never  Vaping Use   Vaping Use: Never used  Substance and Sexual Activity   Alcohol use: Not Currently    Alcohol/week: 14.0 standard drinks    Types: 14 Glasses of wine per week   Drug use: No   Sexual activity: Not on file  Other Topics Concern   Not on file  Social History Narrative   Widow   1 son Lyanne Co) and 1 daughter (another son - twin boy, died in childhood)   Has helped granddaughter with alcoholism, daughter has had methamphetamine addiction   Former smoker   1 caffeine/day   No etOH   Social Determinants of Radio broadcast assistant  Strain: Not on file  Food Insecurity: Not on file  Transportation Needs: Not on file  Physical Activity: Not on file  Stress: Not on file  Social Connections: Not on file  Intimate Partner Violence: Not on file     Allergies  Allergen Reactions   Epinephrine Anaphylaxis   Ergonovine Other (See Comments)    Used in heart study Reaction: Throat closed up    Flagyl [Metronidazole] Hives    Large amounts   Demerol [Meperidine] Itching   Erythromycin Nausea And Vomiting   Percocet [Oxycodone-Acetaminophen] Other (See Comments)    nightmares     Outpatient Medications Prior to Visit  Medication Sig Dispense Refill   albuterol (PROVENTIL) (2.5 MG/3ML) 0.083% nebulizer solution Take 3 mLs (2.5 mg dose) by nebulization every 6 (six) hours as needed for Wheezing.     amoxicillin-clavulanate (AUGMENTIN) 875-125 MG tablet Take 1 tablet by mouth 2 (two) times daily. 14 tablet 0   aspirin 81 MG tablet Take 81 mg by mouth daily.     citalopram (CELEXA) 10 MG tablet Take 10 mg by mouth daily.     diazepam (VALIUM) 5 MG tablet Take 5 mg by mouth as needed for anxiety (when she takes nitroglycerin).      HYDROcodone-acetaminophen (NORCO) 7.5-325 MG tablet Take 1 tablet by mouth as needed for moderate pain. Pt takes 1 tablet by mouth 4(four) times a day as needed.     hydrocortisone 2.5 % cream Apply topically.     isosorbide mononitrate (IMDUR) 30 MG 24 hr tablet Take 0.5 tablets (15 mg total) by mouth daily. 45 tablet 3   methocarbamol (ROBAXIN) 500 MG tablet Take 1 tablet by mouth every six hours as needed for spasms     Misc. Devices MISC Nebulizer for home use with tubing and mouthpiece/pipe or mask Dx: wheezing, shortness of breath post COvid     nitroGLYCERIN (NITROSTAT) 0.4 MG SL tablet Place 1 tablet (0.4 mg total) under the tongue every 5 (five) minutes as needed for chest pain. 25 tablet 3   pantoprazole (PROTONIX) 40 MG tablet Take 1 tablet (40 mg total) by mouth daily. Take 30 minutes  before breakfast. 90 tablet 3   predniSONE (DELTASONE) 10 MG tablet 4 x 3 days, 3 x 3 days, 2 x 3 days, 1 x 3 days then stop 30 tablet 0   rOPINIRole (REQUIP) 1 MG tablet Take 1 mg  by mouth at bedtime.      rosuvastatin (CRESTOR) 40 MG tablet TAKE ONE TABLET BY MOUTH DAILY 90 tablet 0   fluticasone-salmeterol (ADVAIR HFA) 115-21 MCG/ACT inhaler Inhale 2 puffs into the lungs 2 (two) times daily. (Patient not taking: Reported on 11/15/2021) 1 each 12   No facility-administered medications prior to visit.       Objective:   Physical Exam:  General appearance: 80 y.o., female, NAD, conversant  Eyes: anicteric sclerae; PERRL, tracking appropriately HENT: NCAT; MMM Neck: Trachea midline; no lymphadenopathy, no JVD Lungs: faint expiratory wheeze bilaterally, with normal respiratory effort CV: RRR, no murmur  Abdomen: Soft, non-tender; non-distended, BS present  Extremities: No peripheral edema, warm Skin: Normal turgor and texture; no rash Psych: Appropriate affect Neuro: Alert and oriented to person and place, no focal deficit     Vitals:   11/15/21 1356  BP: 130/72  Pulse: (!) 106  Temp: 97.9 F (36.6 C)  TempSrc: Oral  SpO2: 94%  Weight: 172 lb 3.2 oz (78.1 kg)  Height: 4\' 10"  (1.473 m)    94% on RA BMI Readings from Last 3 Encounters:  11/15/21 35.99 kg/m  10/28/21 29.87 kg/m  10/12/21 29.97 kg/m   Wt Readings from Last 3 Encounters:  11/15/21 172 lb 3.2 oz (78.1 kg)  10/28/21 174 lb (78.9 kg)  10/12/21 174 lb 9.6 oz (79.2 kg)     CBC    Component Value Date/Time   WBC 12.0 (H) 10/09/2021 1750   RBC 4.39 10/09/2021 1750   HGB 12.4 10/09/2021 1750   HCT 40.9 10/09/2021 1750   PLT 241 10/09/2021 1750   MCV 93.2 10/09/2021 1750   MCH 28.2 10/09/2021 1750   MCHC 30.3 10/09/2021 1750   RDW 14.2 10/09/2021 1750   LYMPHSABS 1.3 10/09/2021 1750   MONOABS 0.3 10/09/2021 1750   EOSABS 0.1 10/09/2021 1750   BASOSABS 0.1 10/09/2021 1750      Chest  Imaging: CTA Chest 08/17/21 reviewed by me and remarkable for bronchial wall thickening, mosaicism, RV/LV of 1  CXR 09/19/21 reviewed by me and unchanged from prior  CXR 10/09/21 reviewed by me, unchanged from prior  Pulmonary Functions Testing Results: No flowsheet data found.  Echocardiogram:   TTE 09/2021 with EF 50-55%, G1DD, +small PCE  Stress echo 11/11 read as low risk        Assessment & Plan:   # Cough # Wheeze # Dyspnea Still concerned about possibility of asthma. Prominent post-nasal drainage since last visit despite addition of flonase. Diastolic dysfunction, long haul covid, deconditioning still may be contributory to her dyspnea as well.   Plan: - she is still completing application process for AZ&me for assistance with symbicort - will see if she can afford advair 100 diskus with goodrx at [facility] (should be about $61) in meantime - start singulair for rhinitis/asthma - SABA prn - if under good control from asthma standpoint (if that's what this represents) at next visit, then can consider stopping inhaler for a week, obtaining pre/post bronchodilator spirometry, lung volumes, and DLCO. (Lung volumes and DLCO as well due to recent history of covid)       Maryjane Hurter, MD [facility] 11/15/2021 3:23 PM

## 2021-11-15 NOTE — Patient Instructions (Addendum)
-   try to fill advair at [place] [facility] to check cost. It would be 2 puffs twice daily  - start taking singulair 10 mg daily and watch for any effect that it may have on mood - if feeling sad suddenly let me know and stop medication - if you can get symbicort through AZ&me, let me know and we'll switch to it

## 2021-11-21 ENCOUNTER — Telehealth: Payer: Self-pay | Admitting: Student

## 2021-11-21 NOTE — Telephone Encounter (Signed)
Called and spoke with Patient.  Patient stated she started montelukast 11/15/21.  Patient stated med helped with wheezing and cough, but Patient has stopped taking it.  Patient stated she has lymphedema and montelukast has sodium.  Patient stated montelukast has caused arthritis joint and leg pains.  Patient is requesting medication in place of montelukast. Patient prefers [facility].  LOV 11/15/21 Dr. Verlee Monte-  Instructions   Return in about 8 weeks (around 01/10/2022). - try to fill advair at [place] to check cost. It would be 2 puffs twice daily  - start taking singulair 10 mg daily and watch for any effect that it may have on mood - if feeling sad suddenly let me know and stop medication - if you can get symbicort through AZ&me, let me know and we'll switch to it       Message routed to Dr. Valeta Harms DOD-Dr. Verlee Monte off today

## 2021-11-21 NOTE — Telephone Encounter (Signed)
Garner Nash, DO to [facility] Triage Pinc[facility] Badder, Hortencia Conradi, MD     5:03 PM   NM can take a look when he gets back to the office.  Thanks  BLI   Pt notified of response per BI. Will await response from Dr Verlee Monte.

## 2021-11-24 NOTE — Telephone Encounter (Signed)
Worsening edema and arthralgias are rare with singulair but reported in post-marketing surveillance - ok to discontinue if she really feels like her edema and arthralgias are truly tied to when she started it. Did she pick up advair? If so would see how she's doing with that.  Thanks!

## 2021-11-25 NOTE — Telephone Encounter (Signed)
ATC LVMTCB x 1  

## 2021-11-28 NOTE — Telephone Encounter (Signed)
Attempted to call pt but unable to reach. Left message for her to return call. 

## 2021-11-29 NOTE — Telephone Encounter (Signed)
ATC LVMTCB closing encounter r/t office policy

## 2022-01-16 ENCOUNTER — Ambulatory Visit: Payer: Medicare Other | Admitting: Student

## 2022-01-17 ENCOUNTER — Ambulatory Visit: Payer: Medicare Other | Admitting: Student

## 2022-01-17 NOTE — Progress Notes (Deleted)
Synopsis: Referred for possible asthma by Jettie Booze, NP  Subjective:   PATIENT ID: Kristin Roach GENDER: female DOB: 27-Apr-1941, MRN: [id number]  No chief complaint on file.    80yF with history of nCAD, angina, covid in August 2022, OSA on CPAP who is referred for possible asthma.  Had covid-19 6-8 weeks ago. Not hospitalized for it. Has had productive cough more or less since then. Seen in [place] 09/19/21 for suspected asthma exacerbation and sent with prednisone taper. COugh has improved since starting taper. No fever. Hasn't used nebulizer today but recently it's been q6h. Only instance this year she's needed prednisone. Rare heartburn. She is taking zyrtec for postnasal drainage and sinonasal congestion.   Smoke may be a trigger  She is concerned about water damage to her apartment.   Does not appear to have gained any weight this year. No substantial orthopnea.   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 She has no pets. She has no hot tub. She has lived in [place], [place], born in [place] otherwise in [place].   Has only tried albuterol - no other inhalers.   Interval HPI:  Last seen by me 11/15/21 at which point she was still on symbicort and hadn't yet achieved good control of suspected asthma. Did have prominent postnasal drainage and flonase and singulair were started. She stopped singulair in December due to concern for adverse effect (arthritis/leg pain).  Otherwise pertinent review of systems is negative.    Past Medical History:  Diagnosis Date   Angina at rest [facility])    Anxiety    Arthritis    Asthma    bronchial asthma   CAD (coronary artery disease)    Cancer (HCC)    Cataracts, bilateral    Complication of anesthesia    woke up during back procedure   COVID    HOH (hard of hearing)    Lymphedema of both lower extremities    Pneumonia    hx of 2012   PONV  (postoperative nausea and vomiting)    with knee surgery in 11 2015   RLS (restless legs syndrome)    Sleep apnea    cpap     Family History  Problem Relation Age of Onset   Heart attack Mother        (died at 18 from MI)   Leukemia Father    Acute lymphoblastic leukemia Father    Breast cancer Sister 32   Breast cancer Paternal Grandmother      Past Surgical History:  Procedure Laterality Date   ABDOMINAL HYSTERECTOMY     BACK SURGERY     laminectomy   BELPHAROPTOSIS REPAIR     CARDIAC CATHETERIZATION     CESAREAN SECTION     CHOLECYSTECTOMY     COLONOSCOPY  2014   sigmoid hyperplastic polyps   HERNIA REPAIR     SHOULDER SURGERY     removal of bone spur   TONSILLECTOMY     TOTAL KNEE ARTHROPLASTY Left 10/26/2014   Procedure: LEFT TOTAL KNEE ARTHROPLASTY;  Surgeon: Kerin Salen, MD;  Location: [place];  Service: Orthopedics;  Laterality: Left;    Social History   Socioeconomic History   Marital status: Widowed    Spouse name: Not on file   Number of children: Not on file   Years of education: Not on file  Highest education level: Not on file  Occupational History   Not on file  Tobacco Use   Smoking status: Former    Types: Cigarettes   Smokeless tobacco: Never  Vaping Use   Vaping Use: Never used  Substance and Sexual Activity   Alcohol use: Not Currently    Alcohol/week: 14.0 standard drinks    Types: 14 Glasses of wine per week   Drug use: No   Sexual activity: Not on file  Other Topics Concern   Not on file  Social History Narrative   Widow   1 son Lyanne Co) and 1 daughter (another son - twin boy, died in childhood)   Has helped granddaughter with alcoholism, daughter has had methamphetamine addiction   Former smoker   1 caffeine/day   No etOH   Social Determinants of Radio broadcast assistant Strain: Not on file  Food Insecurity: Not on file  Transportation Needs: Not on file  Physical Activity: Not on file  Stress: Not on file   Social Connections: Not on file  Intimate Partner Violence: Not on file     Allergies  Allergen Reactions   Epinephrine Anaphylaxis   Ergonovine Other (See Comments)    Used in heart study Reaction: Throat closed up    Flagyl [Metronidazole] Hives    Large amounts   Demerol [Meperidine] Itching   Erythromycin Nausea And Vomiting   Percocet [Oxycodone-Acetaminophen] Other (See Comments)    nightmares     Outpatient Medications Prior to Visit  Medication Sig Dispense Refill   albuterol (PROVENTIL) (2.5 MG/3ML) 0.083% nebulizer solution Take 3 mLs (2.5 mg dose) by nebulization every 6 (six) hours as needed for Wheezing.     amoxicillin-clavulanate (AUGMENTIN) 875-125 MG tablet Take 1 tablet by mouth 2 (two) times daily. 14 tablet 0   aspirin 81 MG tablet Take 81 mg by mouth daily.     citalopram (CELEXA) 10 MG tablet Take 10 mg by mouth daily.     diazepam (VALIUM) 5 MG tablet Take 5 mg by mouth as needed for anxiety (when she takes nitroglycerin).      fluticasone (FLONASE) 50 MCG/ACT nasal spray Place 1 spray into both nostrils daily. 16 g 11   fluticasone-salmeterol (ADVAIR) 100-50 MCG/ACT AEPB Inhale 1 puff into the lungs 2 (two) times daily. 1 each 6   HYDROcodone-acetaminophen (NORCO) 7.5-325 MG tablet Take 1 tablet by mouth as needed for moderate pain. Pt takes 1 tablet by mouth 4(four) times a day as needed.     hydrocortisone 2.5 % cream Apply topically.     isosorbide mononitrate (IMDUR) 30 MG 24 hr tablet Take 0.5 tablets (15 mg total) by mouth daily. 45 tablet 3   methocarbamol (ROBAXIN) 500 MG tablet Take 1 tablet by mouth every six hours as needed for spasms     Misc. Devices MISC Nebulizer for home use with tubing and mouthpiece/pipe or mask Dx: wheezing, shortness of breath post COvid     montelukast (SINGULAIR) 10 MG tablet Take 1 tablet (10 mg total) by mouth at bedtime. 30 tablet 11   nitroGLYCERIN (NITROSTAT) 0.4 MG SL tablet Place 1 tablet (0.4 mg total) under  the tongue every 5 (five) minutes as needed for chest pain. 25 tablet 3   pantoprazole (PROTONIX) 40 MG tablet Take 1 tablet (40 mg total) by mouth daily. Take 30 minutes before breakfast. 90 tablet 3   predniSONE (DELTASONE) 10 MG tablet 4 x 3 days, 3 x 3 days, 2 x  3 days, 1 x 3 days then stop 30 tablet 0   rOPINIRole (REQUIP) 1 MG tablet Take 1 mg by mouth at bedtime.      rosuvastatin (CRESTOR) 40 MG tablet TAKE ONE TABLET BY MOUTH DAILY 90 tablet 0   No facility-administered medications prior to visit.       Objective:   Physical Exam:  General appearance: 81 y.o., female, NAD, conversant  Eyes: anicteric sclerae; PERRL, tracking appropriately HENT: NCAT; MMM Neck: Trachea midline; no lymphadenopathy, no JVD Lungs: faint expiratory wheeze bilaterally, with normal respiratory effort CV: RRR, no murmur  Abdomen: Soft, non-tender; non-distended, BS present  Extremities: No peripheral edema, warm Skin: Normal turgor and texture; no rash Psych: Appropriate affect Neuro: Alert and oriented to person and place, no focal deficit     There were no vitals filed for this visit.     on RA BMI Readings from Last 3 Encounters:  11/15/21 35.99 kg/m  10/28/21 29.87 kg/m  10/12/21 29.97 kg/m   Wt Readings from Last 3 Encounters:  11/15/21 172 lb 3.2 oz (78.1 kg)  10/28/21 174 lb (78.9 kg)  10/12/21 174 lb 9.6 oz (79.2 kg)     CBC    Component Value Date/Time   WBC 12.0 (H) 10/09/2021 1750   RBC 4.39 10/09/2021 1750   HGB 12.4 10/09/2021 1750   HCT 40.9 10/09/2021 1750   PLT 241 10/09/2021 1750   MCV 93.2 10/09/2021 1750   MCH 28.2 10/09/2021 1750   MCHC 30.3 10/09/2021 1750   RDW 14.2 10/09/2021 1750   LYMPHSABS 1.3 10/09/2021 1750   MONOABS 0.3 10/09/2021 1750   EOSABS 0.1 10/09/2021 1750   BASOSABS 0.1 10/09/2021 1750      Chest Imaging: CTA Chest 08/17/21 reviewed by me and remarkable for bronchial wall thickening, mosaicism, RV/LV of 1  CXR 09/19/21  reviewed by me and unchanged from prior  CXR 10/09/21 reviewed by me, unchanged from prior  Pulmonary Functions Testing Results: No flowsheet data found.  Echocardiogram:   TTE 09/2021 with EF 50-55%, G1DD, +small PCE  Stress echo 11/11 read as low risk        Assessment & Plan:   # Cough # Wheeze # Dyspnea Still concerned about possibility of asthma. Prominent post-nasal drainage since last visit despite addition of flonase. Diastolic dysfunction, long haul covid, deconditioning still may be contributory to her dyspnea as well.   Plan: - she is still completing application process for AZ&me for assistance with symbicort - will see if she can afford advair 100 diskus with goodrx at [facility] (should be about $61) in meantime - start singulair for rhinitis/asthma - SABA prn - if under good control from asthma standpoint (if that's what this represents) at next visit, then can consider stopping inhaler for a week, obtaining pre/post bronchodilator spirometry, lung volumes, and DLCO. (Lung volumes and DLCO as well due to recent history of covid)       Maryjane Hurter, MD [facility] 01/17/2022 9:10 AM

## 2022-01-19 ENCOUNTER — Ambulatory Visit (HOSPITAL_BASED_OUTPATIENT_CLINIC_OR_DEPARTMENT_OTHER): Payer: Medicare Other | Admitting: Cardiology

## 2022-01-19 ENCOUNTER — Encounter (HOSPITAL_BASED_OUTPATIENT_CLINIC_OR_DEPARTMENT_OTHER): Payer: Self-pay

## 2022-04-21 ENCOUNTER — Encounter (HOSPITAL_BASED_OUTPATIENT_CLINIC_OR_DEPARTMENT_OTHER): Payer: Self-pay

## 2022-04-24 NOTE — Telephone Encounter (Signed)
RN attempted to call patient, no answer, left message to call back.  ?

## 2022-05-26 ENCOUNTER — Ambulatory Visit (INDEPENDENT_AMBULATORY_CARE_PROVIDER_SITE_OTHER): Payer: Medicare Other | Admitting: Cardiology

## 2022-05-26 ENCOUNTER — Encounter (HOSPITAL_BASED_OUTPATIENT_CLINIC_OR_DEPARTMENT_OTHER): Payer: Self-pay | Admitting: Cardiology

## 2022-05-26 VITALS — BP 132/68 | HR 73 | Ht <= 58 in | Wt 165.2 lb

## 2022-05-26 DIAGNOSIS — I201 Angina pectoris with documented spasm: Secondary | ICD-10-CM

## 2022-05-26 DIAGNOSIS — R0989 Other specified symptoms and signs involving the circulatory and respiratory systems: Secondary | ICD-10-CM | POA: Diagnosis not present

## 2022-05-26 DIAGNOSIS — I2510 Atherosclerotic heart disease of native coronary artery without angina pectoris: Secondary | ICD-10-CM

## 2022-05-26 DIAGNOSIS — Z7189 Other specified counseling: Secondary | ICD-10-CM | POA: Diagnosis not present

## 2022-05-26 MED ORDER — ISOSORBIDE MONONITRATE ER 30 MG PO TB24: 15.0000 mg | ORAL_TABLET | Freq: Every day | ORAL | 3 refills | Status: DC

## 2022-05-26 NOTE — Patient Instructions (Signed)
Medication Instructions:  Your Physician recommend you continue on your current medication as directed.    We have sent in refills of your Imdur today!   *If you need a refill on your cardiac medications before your next appointment, please call your pharmacy*   Lab Work: None ordered today   Testing/Procedures: EKG looks good today!    Follow-Up: At [facility], you and your health needs are our priority.  As part of our continuing mission to provide you with exceptional heart care, we have created designated Provider Care Teams.  These Care Teams include your primary Cardiologist (physician) and Advanced Practice Providers (APPs -  Physician Assistants and Nurse Practitioners) who all work together to provide you with the care you need, when you need it.  We recommend signing up for the patient portal called "MyChart".  Sign up information is provided on this After Visit Summary.  MyChart is used to connect with patients for Virtual Visits (Telemedicine).  Patients are able to view lab/test results, encounter notes, upcoming appointments, etc.  Non-urgent messages can be sent to your provider as well.   To learn more about what you can do with MyChart, go to [email or web address].    Your next appointment:   1 year(s)  The format for your next appointment:   In Person  Provider:   Buford Dresser, MD{  Other Instructions Heart Healthy Diet Recommendations: A low-salt diet is recommended. Meats should be grilled, baked, or boiled. Avoid fried foods. Focus on lean protein sources like fish or chicken with vegetables and fruits. The American Heart Association is a Microbiologist!  American Heart Association Diet and Lifeystyle Recommendations   Exercise recommendations: The American Heart Association recommends 150 minutes of moderate intensity exercise weekly. Try 30 minutes of moderate intensity exercise 4-5 times per week. This could include walking, jogging, or  swimming.   Important Information About Sugar

## 2022-05-26 NOTE — Progress Notes (Signed)
Cardiology Office Note:    Date:  05/26/2022   ID:  Kristin Roach, DOB 1941-07-16, MRN [id number]  PCP:  April Manson, NP  Cardiologist:  Jodelle Red, MD PhD  Referring MD: April Manson, NP   CC: follow up  History of Present Illness:    Kristin Roach is a 81 y.o. female with a hx of OSA, asthma, arthritis, Prinzmetal's angina, being seen for follow-up. I initially met her 07/30/18 as a new consult.  Cardiac history: Saw Dr. Berline Chough in [place], [place] (Fax [phone]). Notes states: Heart murmur, mitral valve prolapse; sleep apnea, uses CPAP. Dr. Larina Bras at [facility] was her internal medicine physician. Thinks her last stress test was around 2012. Echo in 2017. Had prior heart caths in the early 1990s, did not require stents but does report that she had vasconstriction with ergotamine.  Admitted 09/07/19 for chest pain. Seen by Dr. Eldridge Dace in the hospital. Had CT coronary with nonobstructive disease.  Today:  On 04/21/22, the patient mentioned over mychart she was "slow at calling up words in speech."   The patient endorsed a stutter, and a "slowness" to find the right words, but her speech is not jumbled. It has only happened a couple of times, usually when she was conversing with a group of people. This was also noticeable to her son. She checked for drooping or other facial issues, but there were none. She also mentioned that she does have a hearing problem as well, which may be effecting her ability to communicate.  These episodes resolved on their own.  She said "I'm falling apart, as is to be expected at 9." She mentioned she is otherwise doing "as well as can be expected."  Her blood pressure has gone up recently. It was measured as 140/82 in the office. She does not often check her blood pressure at home. Upon recheck, her blood pressure was 132/68.  She takes Imdur every day, and has nitroglycerine for use when needed. She says she is doing well on these  medications, with no recurrent chest pain. She has not taken prednisone recently, but has taken cortisone injections.   She has had a lot of sinus issues because of allergies, as well as migraines for 3 days this week.   She has an upcoming mohs surgery.   She had 2 teeth fall out 2 weeks ago, and has upcoming dental work to fix it.  She denies any palpitations, chest pain, shortness of breath, or peripheral edema. No lightheadedness, syncope, orthopnea, or PND.   Past Medical History:  Diagnosis Date   Angina at rest [facility])    Anxiety    Arthritis    Asthma    bronchial asthma   CAD (coronary artery disease)    Cancer (HCC)    Cataracts, bilateral    Complication of anesthesia    woke up during back procedure   COVID    HOH (hard of hearing)    Lymphedema of both lower extremities    Pneumonia    hx of 2012   PONV (postoperative nausea and vomiting)    with knee surgery in 11 2015   RLS (restless legs syndrome)    Sleep apnea    cpap    Past Surgical History:  Procedure Laterality Date   ABDOMINAL HYSTERECTOMY     BACK SURGERY     laminectomy   BELPHAROPTOSIS REPAIR     CARDIAC CATHETERIZATION     CESAREAN SECTION  CHOLECYSTECTOMY     COLONOSCOPY  2014   sigmoid hyperplastic polyps   HERNIA REPAIR     SHOULDER SURGERY     removal of bone spur   TONSILLECTOMY     TOTAL KNEE ARTHROPLASTY Left 10/26/2014   Procedure: LEFT TOTAL KNEE ARTHROPLASTY;  Surgeon: Kerin Salen, MD;  Location: [facility];  Service: Orthopedics;  Laterality: Left;    Current Medications: Current Outpatient Medications on File Prior to Visit  Medication Sig   albuterol (PROVENTIL) (2.5 MG/3ML) 0.083% nebulizer solution Take 3 mLs (2.5 mg dose) by nebulization every 6 (six) hours as needed for Wheezing.   aspirin 81 MG tablet Take 81 mg by mouth daily.   citalopram (CELEXA) 10 MG tablet Take 10 mg by mouth daily.   diazepam (VALIUM) 5 MG tablet Take 5 mg by mouth as needed for anxiety (when  she takes nitroglycerin).    fluticasone (FLONASE) 50 MCG/ACT nasal spray Place 1 spray into both nostrils daily.   fluticasone-salmeterol (ADVAIR) 100-50 MCG/ACT AEPB Inhale 1 puff into the lungs 2 (two) times daily.   hydrocortisone 2.5 % cream Apply topically.   methocarbamol (ROBAXIN) 500 MG tablet Take 1 tablet by mouth every six hours as needed for spasms   Misc. Devices MISC Nebulizer for home use with tubing and mouthpiece/pipe or mask Dx: wheezing, shortness of breath post COvid   montelukast (SINGULAIR) 10 MG tablet Take 1 tablet (10 mg total) by mouth at bedtime.   nitroGLYCERIN (NITROSTAT) 0.4 MG SL tablet Place 1 tablet (0.4 mg total) under the tongue every 5 (five) minutes as needed for chest pain.   pantoprazole (PROTONIX) 40 MG tablet Take 1 tablet (40 mg total) by mouth daily. Take 30 minutes before breakfast.   rOPINIRole (REQUIP) 1 MG tablet Take 1 mg by mouth at bedtime.    rosuvastatin (CRESTOR) 40 MG tablet TAKE ONE TABLET BY MOUTH DAILY   HYDROcodone-acetaminophen (NORCO) 7.5-325 MG tablet Take 1 tablet by mouth as needed for moderate pain. Pt takes 1 tablet by mouth 4(four) times a day as needed. (Patient not taking: Reported on 05/26/2022)   No current facility-administered medications on file prior to visit.     Allergies:   Epinephrine, Ergonovine, Flagyl [metronidazole], Demerol [meperidine], Erythromycin, and Percocet [oxycodone-acetaminophen]   Social History   Tobacco Use   Smoking status: Former    Types: Cigarettes   Smokeless tobacco: Never  Vaping Use   Vaping Use: Never used  Substance Use Topics   Alcohol use: Not Currently    Alcohol/week: 14.0 standard drinks of alcohol    Types: 14 Glasses of wine per week   Drug use: No   Family history: mother passed from MI in age 62, father died of leukemia at age 81. Two grandparents died from MI. Sister just died of lung cancer at age 58. No other siblings. Son and daughter both have terrible migraines.  Daughter was a methamphetamine user, had been sober for 8 years but has some residual issues.   ROS:   Please see the history of present illness.    (+)Migraines (+)Slowness to find words when speaking (+)Seasonal allergies  Additional pertinent ROS otherwise unremarkable.    EKGs/Labs/Other Studies Reviewed:    The following studies were reviewed today:  Exercise Stress Test 10/28/21:    The study is normal. The study is low risk.   No ST deviation was noted. Left bundle branch block was seen at baseline.   LV perfusion is normal. There is  no evidence of ischemia. There is no evidence of infarction.   Left ventricular function is normal. Nuclear stress EF: 50 %. The left ventricular ejection fraction is mildly decreased (45-54%). End diastolic cavity size is normal. End systolic cavity size is normal.   Prior study not available for comparison.  Echocardiogram 10/05/21:  IMPRESSIONS     1. Left ventricular ejection fraction, by estimation, is 50 to 55%. The  left ventricle has low normal function. The left ventricle has no regional  wall motion abnormalities. Left ventricular diastolic parameters are  consistent with Grade I diastolic  dysfunction (impaired relaxation).   2. Right ventricular systolic function is normal. The right ventricular  size is normal.   3. The pericardial effusion is localized near the right atrium. There is  no evidence of cardiac tamponade.   4. The mitral valve is normal in structure. No evidence of mitral valve  regurgitation. No evidence of mitral stenosis.   5. The aortic valve is normal in structure. Aortic valve regurgitation is  not visualized. No aortic stenosis is present.   6. The inferior vena cava is normal in size with greater than 50%  respiratory variability, suggesting right atrial pressure of 3 mmHg.    CT Angio Chest 08/17/21:  IMPRESSION: No evidence of pulmonary embolism.   No evidence of acute cardiopulmonary  disease.   Aortic Atherosclerosis (ICD10-I70.0).   CT 09/09/19 Aorta:  Normal size.  Mild diffuse calcifications.  No dissection.   Aortic Valve:  Trileaflet.  Mild calcifications.   Coronary Arteries:  Normal coronary origin.  Right dominance.   RCA is a large dominant artery that gives rise to PDA and PLA. There are only minimal irregularities.   Left main is a large artery that gives rise to LAD and LCX arteries.   LAD is a large vessel that has mild calcified plaque in the proximal and mid portion with stenosis 25-49%.   LCX is a large non-dominant artery that gives rise to one large OM1 branch. There are only minimal irregularities.   OM1 is a very large branch that has no plaque.   Other findings:   Normal pulmonary vein drainage into the left atrium.   Normal left atrial appendage without a thrombus.   IMPRESSION: 1. Coronary calcium score of 208. This was 29 percentile for age and sex matched control.   2. Normal coronary origin with right dominance.   3. CAD-RADS 2. Mild non-obstructive CAD (25-49%). Consider non-atherosclerotic causes of chest pain. Consider preventive therapy and risk factor modification.   4. Mildly dilated pulmonary artery measuring 32 mm.  Echo 09/08/19  1. Left ventricular ejection fraction, by visual estimation, is 40 to 45%. The left ventricle has normal function. Normal left ventricular size. There is no left ventricular hypertrophy.  2. Left ventricular diastolic Doppler parameters are consistent with impaired relaxation pattern of LV diastolic filling.  3. Global right ventricle has normal systolic function.The right ventricular size is normal. No increase in right ventricular wall thickness.  4. Left atrial size was normal.  5. Right atrial size was normal.  6. The mitral valve is normal in structure. Mild mitral valve regurgitation. No evidence of mitral stenosis.  7. The tricuspid valve is normal in structure. Tricuspid valve  regurgitation is trivial.  8. The aortic valve is normal in structure. Aortic valve regurgitation was not visualized by color flow Doppler. Structurally normal aortic valve, with no evidence of sclerosis or stenosis.  9. The pulmonic valve was normal in  structure. Pulmonic valve regurgitation is not visualized by color flow Doppler. 10. Normal pulmonary artery systolic pressure. 11. The inferior vena cava is normal in size with greater than 50% respiratory variability, suggesting right atrial pressure of 3 mmHg.   EKG:  EKG is personally reviewed today. 05/26/22: NSR at 73 bpm, LBBB 05/25/2021: EKG is not ordered today. 11/01/2020: normal sinus rhythm, LBBB at 62 bpm  Recent Labs: 08/17/2021: B Natriuretic Peptide 145.6 10/09/2021: ALT 28; BUN 18; Creatinine, Ser 0.77; Hemoglobin 12.4; Platelets 241; Potassium 4.0; Sodium 138  Recent Lipid Panel    Component Value Date/Time   CHOL 131 04/26/2020 1610   TRIG 94 04/26/2020 1610   HDL 59 04/26/2020 1610   CHOLHDL 2.2 04/26/2020 1610   CHOLHDL 4.7 09/09/2019 0329   VLDL 18 09/09/2019 0329   LDLCALC 54 04/26/2020 1610    Physical Exam:    VS:  BP 132/68 (BP Location: Right Arm, Patient Position: Sitting, Cuff Size: Normal)   Pulse 73   Ht $R'4\' 10"'GX$  (1.473 m)   Wt 165 lb 3.2 oz (74.9 kg)   SpO2 97%   BMI 34.53 kg/m     Wt Readings from Last 3 Encounters:  05/26/22 165 lb 3.2 oz (74.9 kg)  11/15/21 172 lb 3.2 oz (78.1 kg)  10/28/21 174 lb (78.9 kg)    GEN: Well nourished, well developed in no acute distress HEENT: Normal, moist mucous membranes NECK: No JVD CARDIAC: regular rhythm, normal S1 and S2, no rubs or gallops. No murmur. VASCULAR: Radial and DP pulses 2+ bilaterally. No carotid bruits RESPIRATORY:  Clear to auscultation without rales or wheezing ABDOMEN: Soft, non-tender, non-distended MUSCULOSKELETAL:  Ambulates independently SKIN: Warm and dry, no significant edema NEUROLOGIC:  Alert and oriented x 3. No focal neuro  deficits noted. PSYCHIATRIC:  Normal affect   ASSESSMENT:    1. Prinzmetal angina (HCC)     PLAN:    Nonobstructive CAD, history of Prinzmetal's angina, prior admission for chest pain:  -CT coronary with nonobstructive CAD -feels much better since starting imdur, tolerating well, no recent chest pain -on aspirin 81 mg daily, tolerating -continue rosuvastatin 40 mg daily, tolerating -we have previously discussed recommended avoidance of NSAIDs in CAD -has SL NG, no recent use -counseled on red flag warning signs that need immediate medical attention -no longer on beta blocker due to low blood pressure  Systolic dysfunction without clinical heart failure -EF 40-45% in 202, most recent 50-55% 09/2021 -nonobstructive CAD on CT coronary -no longer on beta blocker due to hypotension, also prevents ACEi/ARB/ARNI.   Labile BP: -has been variable, but largely normal range on imdur only. Has history of hypotension in the past.  Cardiovascular risk counseling and prevention: -recommend heart healthy/Mediterranean diet, with whole grains, fruits, vegetable, fish, lean meats, nuts, and olive oil. Limit salt. -recommend moderate walking, 3-5 times/week for 30-50 minutes each session. Aim for at least 150 minutes.week. Goal should be pace of 3 miles/hours, or walking 1.5 miles in 30 minutes -recommend avoidance of tobacco products. Avoid excess alcohol.  Plan for follow up: 1 year or sooner PRN.  Medication Adjustments/Labs and Tests Ordered: Current medicines are reviewed at length with the patient today.  Concerns regarding medicines are outlined above.  Orders Placed This Encounter  Procedures   EKG 12-Lead   Meds ordered this encounter  Medications   isosorbide mononitrate (IMDUR) 30 MG 24 hr tablet    Sig: Take 0.5 tablets (15 mg total) by mouth daily.    Dispense:  45 tablet    Refill:  3    Patient Instructions  Medication Instructions:  Your Physician recommend you  continue on your current medication as directed.    We have sent in refills of your Imdur today!   *If you need a refill on your cardiac medications before your next appointment, please call your pharmacy*   Lab Work: None ordered today   Testing/Procedures: EKG looks good today!    Follow-Up: At [facility], you and your health needs are our priority.  As part of our continuing mission to provide you with exceptional heart care, we have created designated Provider Care Teams.  These Care Teams include your primary Cardiologist (physician) and Advanced Practice Providers (APPs -  Physician Assistants and Nurse Practitioners) who all work together to provide you with the care you need, when you need it.  We recommend signing up for the patient portal called "MyChart".  Sign up information is provided on this After Visit Summary.  MyChart is used to connect with patients for Virtual Visits (Telemedicine).  Patients are able to view lab/test results, encounter notes, upcoming appointments, etc.  Non-urgent messages can be sent to your provider as well.   To learn more about what you can do with MyChart, go to [email or web address].    Your next appointment:   1 year(s)  The format for your next appointment:   In Person  Provider:   Buford Dresser, MD{  Other Instructions Heart Healthy Diet Recommendations: A low-salt diet is recommended. Meats should be grilled, baked, or boiled. Avoid fried foods. Focus on lean protein sources like fish or chicken with vegetables and fruits. The American Heart Association is a Microbiologist!  American Heart Association Diet and Lifeystyle Recommendations   Exercise recommendations: The American Heart Association recommends 150 minutes of moderate intensity exercise weekly. Try 30 minutes of moderate intensity exercise 4-5 times per week. This could include walking, jogging, or swimming.   Important Information About Sugar          I,Mathew Stumpf,acting as a scribe for [employer], MD.,have documented all relevant documentation on the behalf of Buford Dresser, MD,as directed by  Buford Dresser, MD while in the presence of Buford Dresser, MD.  I, Buford Dresser, MD, have reviewed all documentation for this visit. The documentation on 05/26/22 for the exam, diagnosis, procedures, and orders are all accurate and complete.  Signed, Buford Dresser, MD PhD 05/26/2022   [place]

## 2022-09-15 ENCOUNTER — Other Ambulatory Visit (HOSPITAL_BASED_OUTPATIENT_CLINIC_OR_DEPARTMENT_OTHER): Payer: Self-pay | Admitting: Cardiology

## 2022-09-19 ENCOUNTER — Emergency Department (HOSPITAL_BASED_OUTPATIENT_CLINIC_OR_DEPARTMENT_OTHER)
Admission: EM | Admit: 2022-09-19 | Discharge: 2022-09-19 | Disposition: A | Discharge status: Home / Self Care | Payer: Medicare Other | Attending: Emergency Medicine | Admitting: Emergency Medicine

## 2022-09-19 ENCOUNTER — Telehealth: Payer: Self-pay | Admitting: Cardiology

## 2022-09-19 ENCOUNTER — Encounter (HOSPITAL_BASED_OUTPATIENT_CLINIC_OR_DEPARTMENT_OTHER): Payer: Self-pay

## 2022-09-19 ENCOUNTER — Emergency Department (HOSPITAL_BASED_OUTPATIENT_CLINIC_OR_DEPARTMENT_OTHER): Payer: Medicare Other

## 2022-09-19 ENCOUNTER — Other Ambulatory Visit: Payer: Self-pay

## 2022-09-19 ENCOUNTER — Emergency Department (HOSPITAL_BASED_OUTPATIENT_CLINIC_OR_DEPARTMENT_OTHER): Payer: Medicare Other | Admitting: Radiology

## 2022-09-19 DIAGNOSIS — R000 Tachycardia, unspecified: Secondary | ICD-10-CM | POA: Diagnosis not present

## 2022-09-19 DIAGNOSIS — R002 Palpitations: Secondary | ICD-10-CM | POA: Insufficient documentation

## 2022-09-19 DIAGNOSIS — R0789 Other chest pain: Secondary | ICD-10-CM | POA: Diagnosis present

## 2022-09-19 DIAGNOSIS — R079 Chest pain, unspecified: Secondary | ICD-10-CM

## 2022-09-19 DIAGNOSIS — R600 Localized edema: Secondary | ICD-10-CM | POA: Insufficient documentation

## 2022-09-19 DIAGNOSIS — Z7982 Long term (current) use of aspirin: Secondary | ICD-10-CM | POA: Diagnosis not present

## 2022-09-19 DIAGNOSIS — I10 Essential (primary) hypertension: Secondary | ICD-10-CM | POA: Diagnosis not present

## 2022-09-19 LAB — TROPONIN I (HIGH SENSITIVITY)
Troponin I (High Sensitivity): 14 ng/L (ref <18)
Troponin I (High Sensitivity): 12 ng/L (ref <18)

## 2022-09-19 LAB — BASIC METABOLIC PANEL
Anion gap: 13 (ref 5–15)
BUN: 10 mg/dL (ref 8–23)
CO2: 27 mmol/L (ref 22–32)
Calcium: 9.6 mg/dL (ref 8.9–10.3)
Chloride: 102 mmol/L (ref 98–111)
Creatinine, Ser: 0.62 mg/dL (ref 0.44–1.00)
GFR, Estimated: >60 mL/min (ref >60)
Glucose, Bld: 165 mg/dL — ABNORMAL HIGH (ref 70–99)
Potassium: 3.8 mmol/L (ref 3.5–5.1)
Sodium: 142 mmol/L (ref 135–145)

## 2022-09-19 LAB — CBC
HCT: 41.8 % (ref 36.0–46.0)
Hemoglobin: 13.3 g/dL (ref 12.0–15.0)
MCH: 28.9 pg (ref 26.0–34.0)
MCHC: 31.8 g/dL (ref 30.0–36.0)
MCV: 90.7 fL (ref 80.0–100.0)
Platelets: 235 10*3/uL (ref 150–400)
RBC: 4.61 MIL/uL (ref 3.87–5.11)
RDW: 13.1 % (ref 11.5–15.5)
WBC: 7.7 10*3/uL (ref 4.0–10.5)
nRBC: 0 % (ref 0.0–0.2)

## 2022-09-19 MED ORDER — NITROGLYCERIN 0.4 MG SL SUBL
0.4000 mg | SUBLINGUAL_TABLET | SUBLINGUAL | Status: DC | PRN
  Administered 2022-09-19: 0.4 mg via SUBLINGUAL
  Filled 2022-09-19 (×2): qty 1

## 2022-09-19 MED ORDER — ASPIRIN 325 MG PO TABS
325.0000 mg | ORAL_TABLET | Freq: Every day | ORAL | Status: DC
  Administered 2022-09-19: 325 mg via ORAL
  Filled 2022-09-19: qty 1

## 2022-09-19 MED ORDER — ACETAMINOPHEN 500 MG PO TABS
1000.0000 mg | ORAL_TABLET | Freq: Once | ORAL | Status: AC
  Administered 2022-09-19: 1000 mg via ORAL
  Filled 2022-09-19: qty 2

## 2022-09-19 MED ORDER — IOHEXOL 350 MG/ML SOLN
100.0000 mL | Freq: Once | INTRAVENOUS | Status: AC | PRN
  Administered 2022-09-19: 80 mL via INTRAVENOUS

## 2022-09-19 NOTE — Telephone Encounter (Signed)
Agree with plan as provided.  To prevent palpitations: Make sure you are adequately hydrated.  Avoid and/or limit caffeine containing beverages like soda or tea. Exercise regularly.  Manage stress well. Some over the counter medications can cause palpitations such as Benadryl, AdvilPM, TylenolPM. Regular Advil or Tylenol do not cause palpitations.    Loel Dubonnet, NP

## 2022-09-19 NOTE — Telephone Encounter (Signed)
Patient returned call to the office. Patient states that she woke up with Tachycardia- which she states that she has never had before, and nauseated for 2 days and real tired. Patient unable to find her blood pressure cuff to check her pressure. She states it woke her up but has now stopped. Patient scheduled for 10/6 2:45pm with Laurann Montana, NP. Advised patient to call back if it happened again, but if happened again and wouldn't stop to be seen in the ED

## 2022-09-19 NOTE — Telephone Encounter (Signed)
Left message for patient to call back  

## 2022-09-19 NOTE — ED Notes (Signed)
Reviewed AVS/discharge instruction with patient. Time allotted for and all questions answered. Patient is agreeable for d/c and escorted to ed exit by staff.  

## 2022-09-19 NOTE — ED Triage Notes (Signed)
Pt states that she felt like her heart was racing this morning. This has since subsided, but she is having angina. Pt took 3 nitro, which "helped a little bit".  Pt states she called her cardiologist, who set her up with an appt Friday.

## 2022-09-19 NOTE — ED Provider Notes (Signed)
[facility] DEPT Provider Note   CSN: [id number] Arrival date & time: 09/19/22  1655     History  Chief Complaint  Patient presents with   Chest Pain   Palpitations    Kristin Roach is a 81 y.o. female.  Patient is a 81 year old female who presents with chest pain.  She has a history of Prinzmetal's angina, chronic lymphedema, hypertension.  She is on Imdur for her angina and also takes nitroglycerin.  She said that this morning she had an episode of tachycardia where she felt like her heart was racing.  She is not sure how long it lasted but eventually subsided.  Then about 1:00 she noticed some angina symptoms.  She describes a tightness in her left chest going to her left back and left arm.  She says it felt similar to her prior anginal symptoms.  No shortness of breath.  No exertional symptoms.  She has chronic leg swelling that appears to be unchanged.  She contacted her cardiologist and she has an appointment on Friday which is 3 days from now.  However given her ongoing symptoms, she came here for further evaluation.       Home Medications Prior to Admission medications   Medication Sig Start Date End Date Taking? Authorizing Provider  aspirin 81 MG tablet Take 81 mg by mouth daily.    [provider]  citalopram (CELEXA) 10 MG tablet Take 10 mg by mouth daily. 10/14/20   [provider]  diazepam (VALIUM) 5 MG tablet Take 5 mg by mouth as needed for anxiety (when she takes nitroglycerin).  06/13/17   [provider]  fluticasone (FLONASE) 50 MCG/ACT nasal spray Place 1 spray into both nostrils daily. 11/15/21   Maryjane Hurter, MD  fluticasone-salmeterol (ADVAIR) 100-50 MCG/ACT AEPB Inhale 1 puff into the lungs 2 (two) times daily. 11/15/21   Maryjane Hurter, MD  HYDROcodone-acetaminophen (NORCO) 7.5-325 MG tablet Take 1 tablet by mouth as needed for moderate pain. Pt takes 1 tablet by mouth 4(four) times a day as  needed. Patient not taking: Reported on 05/26/2022    [provider]  hydrocortisone 2.5 % cream Apply topically. 04/15/21   [provider]  isosorbide mononitrate (IMDUR) 30 MG 24 hr tablet Take 0.5 tablets (15 mg total) by mouth daily. 05/26/22 05/21/23  Buford Dresser, MD  methocarbamol (ROBAXIN) 500 MG tablet Take 1 tablet by mouth every six hours as needed for spasms 01/17/21   [provider]  Misc. Devices MISC Nebulizer for home use with tubing and mouthpiece/pipe or mask Dx: wheezing, shortness of breath post COvid 09/12/21   [provider]  montelukast (SINGULAIR) 10 MG tablet Take 1 tablet (10 mg total) by mouth at bedtime. 11/15/21   Maryjane Hurter, MD  nitroGLYCERIN (NITROSTAT) 0.4 MG SL tablet Place 1 tablet (0.4 mg total) under the tongue every 5 (five) minutes as needed for chest pain. 02/09/21   Buford Dresser, MD  pantoprazole (PROTONIX) 40 MG tablet Take 1 tablet (40 mg total) by mouth daily. Take 30 minutes before breakfast. 03/16/21   Gatha Mayer, MD  rOPINIRole (REQUIP) 1 MG tablet Take 1 mg by mouth at bedtime.  09/23/19   [provider]  rosuvastatin (CRESTOR) 40 MG tablet TAKE ONE TABLET BY MOUTH DAILY 09/15/22   Buford Dresser, MD      Allergies    Epinephrine, Ergonovine, Flagyl [metronidazole], Demerol [meperidine], Erythromycin, and Percocet [oxycodone-acetaminophen]    Review of  Systems   Review of Systems  Constitutional:  Negative for chills, diaphoresis, fatigue and fever.  HENT:  Negative for congestion, rhinorrhea and sneezing.   Eyes: Negative.   Respiratory:  Positive for chest tightness. Negative for cough and shortness of breath.   Cardiovascular:  Positive for palpitations. Negative for chest pain and leg swelling.  Gastrointestinal:  Negative for abdominal pain, blood in stool, diarrhea, nausea and vomiting.  Genitourinary:  Negative for difficulty urinating, flank pain, frequency and  hematuria.  Musculoskeletal:  Positive for back pain. Negative for arthralgias.  Skin:  Negative for rash.  Neurological:  Negative for dizziness, speech difficulty, weakness, numbness and headaches.    Physical Exam Updated Vital Signs BP (!) 146/81   Pulse 77   Temp 98.2 F (36.8 C) (Oral)   Resp 16   Ht '4\' 10"'$  (1.473 m)   Wt 74.8 kg   SpO2 96%   BMI 34.49 kg/m  Physical Exam Constitutional:      Appearance: She is well-developed.  HENT:     Head: Normocephalic and atraumatic.  Eyes:     Pupils: Pupils are equal, round, and reactive to light.  Cardiovascular:     Rate and Rhythm: Normal rate and regular rhythm.     Heart sounds: Normal heart sounds.  Pulmonary:     Effort: Pulmonary effort is normal. No respiratory distress.     Breath sounds: Normal breath sounds. No wheezing or rales.  Chest:     Chest wall: Tenderness (Positive tenderness to the left chest wall, no crepitus or deformity, no rashes) present.  Abdominal:     General: Bowel sounds are normal.     Palpations: Abdomen is soft.     Tenderness: There is no abdominal tenderness. There is no guarding or rebound.  Musculoskeletal:        General: Normal range of motion.     Cervical back: Normal range of motion and neck supple.     Comments: 2+ pitting edema to lower extremities bilaterally, no calf tenderness  Lymphadenopathy:     Cervical: No cervical adenopathy.  Skin:    General: Skin is warm and dry.     Findings: No rash.  Neurological:     Mental Status: She is alert and oriented to person, place, and time.     ED Results / Procedures / Treatments   Labs (all labs ordered are listed, but only abnormal results are displayed) Labs Reviewed  BASIC METABOLIC PANEL - Abnormal; Notable for the following components:      Result Value   Glucose, Bld 165 (*)    All other components within normal limits  CBC  TROPONIN I (HIGH SENSITIVITY)  TROPONIN I (HIGH SENSITIVITY)    EKG EKG  Interpretation  Date/Time:  Tuesday September 19 2022 17:25:01 EDT Ventricular Rate:  77 PR Interval:  168 QRS Duration: 132 QT Interval:  422 QTC Calculation: 477 R Axis:   182 Text Interpretation: Sinus rhythm with Premature atrial complexes Right superior axis deviation Non-specific intra-ventricular conduction block Abnormal ECG When compared with ECG of 09-Oct-2021 17:34, Premature atrial complexes are now Present QRS axis Shifted left since last tracing no significant change Confirmed by Malvin Johns [phone]) on 09/19/2022 6:35:38 PM  Radiology CT Angio Chest Aorta w/CM &/OR wo/CM  Result Date: 09/19/2022 CLINICAL DATA:  Chest and abdominal pain, initial encounter EXAM: CT ANGIOGRAPHY CHEST WITH CONTRAST TECHNIQUE: Multidetector CT imaging of the chest was performed using the standard protocol during bolus administration of  intravenous contrast. Multiplanar CT image reconstructions and MIPs were obtained to evaluate the vascular anatomy. RADIATION DOSE REDUCTION: This exam was performed according to the departmental dose-optimization program which includes automated exposure control, adjustment of the mA and/or kV according to patient size and/or use of iterative reconstruction technique. CONTRAST:  59m OMNIPAQUE IOHEXOL 350 MG/ML SOLN COMPARISON:  Chest x-ray from earlier in the same day, CT from 08/17/2021 FINDINGS: Cardiovascular: Thoracic aorta and its branches demonstrate mild atherosclerotic calcifications. No aneurysmal dilatation is noted. The heart is at the upper limits of normal in size. No aortic dissection is seen. The pulmonary artery shows a normal branching pattern bilaterally. No intraluminal filling defect to suggest pulmonary embolism is seen. Mild coronary calcifications are noted. Mediastinum/Nodes: Thoracic inlet demonstrates heterogeneity of the thyroid with mild enlargement without definitive nodule. These changes are stable from the prior exam. No hilar or mediastinal  adenopathy is noted. The esophagus is within normal limits. Lungs/Pleura: Minimal atelectatic changes are noted in the bases. No focal confluent infiltrate or sizable effusion is seen. No parenchymal nodules are noted. No pneumothorax is seen. Upper Abdomen: No acute abnormality. Musculoskeletal: Degenerative changes of the thoracic spine are noted. No acute bony abnormality is seen. Review of the MIP images confirms the above findings. IMPRESSION: No evidence of acute aortic abnormality. No pulmonary emboli are seen. Diffuse heterogeneity of the thyroid without discrete nodule. Aortic Atherosclerosis (ICD10-I70.0). Electronically Signed   By: MInez CatalinaM.D.   On: 09/19/2022 20:44   DG Chest 2 View  Result Date: 09/19/2022 CLINICAL DATA:  Chest pain. EXAM: CHEST - 2 VIEW COMPARISON:  October 09, 2021. FINDINGS: The heart size and mediastinal contours are within normal limits. Both lungs are clear. The visualized skeletal structures are unremarkable. IMPRESSION: No active cardiopulmonary disease. Electronically Signed   By: JMarijo ConceptionM.D.   On: 09/19/2022 17:51    Procedures Procedures    Medications Ordered in ED Medications  aspirin tablet 325 mg (325 mg Oral Given 09/19/22 2010)  nitroGLYCERIN (NITROSTAT) SL tablet 0.4 mg (0.4 mg Sublingual Given 09/19/22 2214)  acetaminophen (TYLENOL) tablet 1,000 mg (1,000 mg Oral Given 09/19/22 2009)  iohexol (OMNIPAQUE) 350 MG/ML injection 100 mL (80 mLs Intravenous Contrast Given 09/19/22 2026)    ED Course/ Medical Decision Making/ A&P                           Medical Decision Making Amount and/or Complexity of Data Reviewed Labs: ordered. Radiology: ordered.  Risk OTC drugs. Prescription drug management.   Patient is a 81year old who presents with chest pain.  She has a history of Prinzmetal's angina.  She takes Imdur at home although she says she has not taken it for the last 2 days because she forgot.  She had an episode of  tachycardia this morning and then angina that started later this afternoon.  She said it felt like her typical angina although it lasted longer than it normally does.  Given that she had pain going around to her back, a CT scan of her chest was performed which shows no evidence of aortic injury, no dissection or PE.  Chest x-ray had previously been done which shows no evidence of pulmonary edema, pneumonia or other acute abnormality.  This was interpreted by me and confirmed by the radiologist.  Her EKG does not show any ischemic changes.  It was compared to old EKGs and does not appear change.  She has  had 2 negative troponins.  Her other labs are nonconcerning.  She was given aspirin and 1 nitroglycerin in the ED.  Her pain completely resolved after the nitroglycerin.  She says her symptoms are consistent with her prior anginal type symptoms.  She also has somewhat reproducible pain to her chest wall.  She is fairly adamant about going home at this point.  She does not want to stay for any further evaluation.  Her son is waiting for her and she needs a ride home.  She has an appointment in 3 days to follow-up with her cardiologist.  Return precautions were given.  Final Clinical Impression(s) / ED Diagnoses Final diagnoses:  Chest pain, unspecified type    Rx / DC Orders ED Discharge Orders     None         Malvin Johns, MD 09/19/22 2307

## 2022-09-19 NOTE — Telephone Encounter (Signed)
STAT if HR is under 50 or over 120 (normal HR is 60-100 beats per minute)  What is your heart rate? N/A  Do you have a log of your heart rate readings (document readings)? N/A  Do you have any other symptoms? Nausea, fatigue

## 2022-09-22 ENCOUNTER — Ambulatory Visit (INDEPENDENT_AMBULATORY_CARE_PROVIDER_SITE_OTHER): Payer: Medicare Other | Admitting: Nurse Practitioner

## 2022-09-22 VITALS — BP 124/70 | HR 95 | Ht <= 58 in | Wt 168.8 lb

## 2022-09-22 DIAGNOSIS — F439 Reaction to severe stress, unspecified: Secondary | ICD-10-CM | POA: Diagnosis not present

## 2022-09-22 DIAGNOSIS — F419 Anxiety disorder, unspecified: Secondary | ICD-10-CM | POA: Diagnosis not present

## 2022-09-22 DIAGNOSIS — I25111 Atherosclerotic heart disease of native coronary artery with angina pectoris with documented spasm: Secondary | ICD-10-CM

## 2022-09-22 DIAGNOSIS — I700 Atherosclerosis of aorta: Secondary | ICD-10-CM

## 2022-09-22 DIAGNOSIS — R7303 Prediabetes: Secondary | ICD-10-CM

## 2022-09-22 DIAGNOSIS — R002 Palpitations: Secondary | ICD-10-CM

## 2022-09-22 DIAGNOSIS — E669 Obesity, unspecified: Secondary | ICD-10-CM

## 2022-09-22 DIAGNOSIS — I201 Angina pectoris with documented spasm: Secondary | ICD-10-CM

## 2022-09-22 DIAGNOSIS — I447 Left bundle-branch block, unspecified: Secondary | ICD-10-CM

## 2022-09-22 MED ORDER — NITROGLYCERIN 0.4 MG SL SUBL: 0.4000 mg | SUBLINGUAL_TABLET | SUBLINGUAL | 4 refills | Status: DC | PRN

## 2022-09-22 MED ORDER — ISOSORBIDE MONONITRATE ER 30 MG PO TB24: 30.0000 mg | ORAL_TABLET | Freq: Every day | ORAL | 3 refills | Status: DC
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
## 2022-09-22 NOTE — Progress Notes (Signed)
Cardiology Office Note:    Date:  09/23/2022   ID:  Kristin Roach, DOB [birth year], MRN [id number]  PCP:  Jettie Booze, NP   [place] Providers Cardiologist:  Buford Dresser, MD     Referring MD: Jettie Booze, NP   CC: Palpitations and chest pain  History of Present Illness:    Kristin Roach is a 81 y.o. female with a hx of the following:  Prinzmetal's angina CAD Asthma OSA BPPV Anxiety Prediabetes Lymphedema  She was initially seen for cardiology services in 2019.  Previously has seen Dr. Jonne Ply in [place], [place].  It was reported that she had a mitral valve prolapse, heart murmur as well as sleep apnea and used CPAP.  Previously, she had heart cath in the early 90s that did not require stents however it was reported that she had vasoconstriction with ergotamine.   Was admitted in 2020 for chest pain and saw Dr. Irish Lack in the hospital.  Coronary CTA revealed nonobstructive CAD.  Last seen by Dr. Harrell Gave on May 26, 2022.  She noted that in May of this year, patient mentioned on MyChart that she was slow with her words, speech was not jumbled, happened a few times.  These episodes resolved, she denied any other strokelike symptoms.  Denied any chest pain and overall denied any cardiac complaints or issues.  Presented to [facility] ED on September 19, 2022 complaining of palpitations and chest pain.  Says that morning she had an episode of tachycardia, felt like heart was racing.  Not sure how long it lasted.  Reported chest tightness along left side, radiating to left back and left arm.  Symptoms similar to previous anginal symptoms, denied any exertional symptoms.  EKG revealed sinus rhythm with PACs, right superior axis deviation, nonspecific intraventricular conduction block, no ischemic changes. Troponins negative x2.  Other labs unremarkable.  CTA chest revealed no acute abnormality and no PE.  Diffuse heterogeneity of the thyroid without discrete nodule.   2 view chest x-ray no active cardiopulmonary disease.  Had some reproducible pain to chest wall on exam.  After this, she did not want further evaluation wanted to go home.  Today she presents for follow-up.  She states the monrnig before ED arrival, she woke up and felt her heart racing, said she didn't sleep well that evening and thought she saw a spider on the ceiling of her bedroom when she woke up and that is when the palpitations started. Palpitations went away within about an hour. Has never had palpitations before and has not had recurrence of tachycardia or palpitations. Later that afternoon around 4 PM is when her left-sided angina started, 7/10 in intensity, with radiation to left shoulder blade, states is "feels like a knife." She knew she had to go to the ED as these were similar symptoms she had with her previous episode of Prinzmetal's angina. Said she did not take Imdur for one day, so she wonders if this contributed to it. She took Nitroglycerin Wednesday night after returning home from the ED and she had some improvement in symptoms. She states she still has the chest pain but it has improved and currently rates it as a 4-5/10 in intensity. She says this is the longest it has ever lasted.  Associated symptoms include nausea for 2 to 3 days.  Felt lightheaded yesterday but this has resolved.  Continues to feel very fatigued. When I asked her about stress in her life, she became a  little tearful and admitted to "significant stress" recently. Denies any recurrent palpitations, shortness of breath, dizziness, syncope, presyncope, orthopnea, PND, bleeding, or claudication. Has chronic lymphedema. Denies any other questions or concerns.    Past Medical History:  Diagnosis Date   Angina at rest    Anxiety    Arthritis    Asthma    bronchial asthma   CAD (coronary artery disease)    Cancer (HCC)    Cataracts, bilateral    Chest pain    Complication of anesthesia    woke up during back  procedure   COVID    HOH (hard of hearing)    Lymphedema of both lower extremities    Pneumonia    hx of 2012   PONV (postoperative nausea and vomiting)    with knee surgery in 11 2015   RLS (restless legs syndrome)    Sleep apnea    cpap    Past Surgical History:  Procedure Laterality Date   ABDOMINAL HYSTERECTOMY     BACK SURGERY     laminectomy   BELPHAROPTOSIS REPAIR     CARDIAC CATHETERIZATION     CESAREAN SECTION     CHOLECYSTECTOMY     COLONOSCOPY  2014   sigmoid hyperplastic polyps   HERNIA REPAIR     SHOULDER SURGERY     removal of bone spur   TONSILLECTOMY     TOTAL KNEE ARTHROPLASTY Left 10/26/2014   Procedure: LEFT TOTAL KNEE ARTHROPLASTY;  Surgeon: Kerin Salen, MD;  Location: [facility];  Service: Orthopedics;  Laterality: Left;    Current Medications: Current Meds  Medication Sig   aspirin 81 MG tablet Take 81 mg by mouth daily.   citalopram (CELEXA) 10 MG tablet Take 10 mg by mouth daily.   diazepam (VALIUM) 5 MG tablet Take 5 mg by mouth as needed for anxiety (when she takes nitroglycerin).    fluticasone (FLONASE) 50 MCG/ACT nasal spray Place 1 spray into both nostrils daily.   fluticasone-salmeterol (ADVAIR) 100-50 MCG/ACT AEPB Inhale 1 puff into the lungs 2 (two) times daily.   hydrochlorothiazide (HYDRODIURIL) 12.5 MG tablet Take 12.5 mg by mouth as needed.   HYDROcodone-acetaminophen (NORCO) 7.5-325 MG tablet Take 1 tablet by mouth as needed for moderate pain. Pt takes 1 tablet by mouth 4(four) times a day as needed.   hydrocortisone 2.5 % cream Apply topically.   methocarbamol (ROBAXIN) 500 MG tablet Take 1 tablet by mouth every six hours as needed for spasms   Misc. Devices MISC Nebulizer for home use with tubing and mouthpiece/pipe or mask Dx: wheezing, shortness of breath post COvid   montelukast (SINGULAIR) 10 MG tablet Take 1 tablet (10 mg total) by mouth at bedtime.   pantoprazole (PROTONIX) 40 MG tablet Take 1 tablet (40 mg total) by mouth daily.  Take 30 minutes before breakfast.   rOPINIRole (REQUIP) 1 MG tablet Take 1 mg by mouth at bedtime.    rosuvastatin (CRESTOR) 40 MG tablet TAKE ONE TABLET BY MOUTH DAILY   isosorbide mononitrate (IMDUR) 30 MG 24 hr tablet Take 0.5 tablets (15 mg total) by mouth daily.   nitroGLYCERIN (NITROSTAT) 0.4 MG SL tablet Place 1 tablet (0.4 mg total) under the tongue every 5 (five) minutes as needed for chest pain.     Allergies:   Epinephrine, Ergonovine, Flagyl [metronidazole], Demerol [meperidine], Erythromycin, and Percocet [oxycodone-acetaminophen]   Social History   Socioeconomic History   Marital status: Widowed    Spouse name: Not on file  Number of children: Not on file   Years of education: Not on file   Highest education level: Not on file  Occupational History   Not on file  Tobacco Use   Smoking status: Former    Types: Cigarettes   Smokeless tobacco: Never  Vaping Use   Vaping Use: Never used  Substance and Sexual Activity   Alcohol use: Not Currently    Alcohol/week: 14.0 standard drinks of alcohol    Types: 14 Glasses of wine per week   Drug use: No   Sexual activity: Not on file  Other Topics Concern   Not on file  Social History Narrative   Widow   1 son Lyanne Co) and 1 daughter (another son - twin boy, died in childhood)   Has helped granddaughter with alcoholism, daughter has had methamphetamine addiction   Former smoker   1 caffeine/day   No etOH   Social Determinants of Radio broadcast assistant Strain: Not on file  Food Insecurity: Not on file  Transportation Needs: Not on file  Physical Activity: Not on file  Stress: Not on file  Social Connections: Not on file     Family History: The patient's family history includes Acute lymphoblastic leukemia in her father; Breast cancer in her paternal grandmother; Breast cancer (age of onset: 24) in her sister; Heart attack in her mother; Leukemia in her father.  ROS:   Review of Systems   Constitutional:  Positive for malaise/fatigue. Negative for chills, diaphoresis, fever and weight loss.  HENT: Negative.    Eyes: Negative.   Respiratory: Negative.    Cardiovascular:  Positive for chest pain, palpitations and leg swelling. Negative for orthopnea, claudication and PND.       Chronic leg swelling (hx of lymphedema of BLE), see HPI regarding palpitations and CP.   Gastrointestinal: Negative.   Genitourinary: Negative.   Musculoskeletal: Negative.   Skin: Negative.   Neurological: Negative.   Endo/Heme/Allergies: Negative.   Psychiatric/Behavioral:  Negative for depression, hallucinations, memory loss, substance abuse and suicidal ideas. The patient is nervous/anxious. The patient does not have insomnia.        See HPI.     Please see the history of present illness.    All other systems reviewed and are negative.  EKGs/Labs/Other Studies Reviewed:    The following studies were reviewed today:   EKG:  EKG is ordered today.  The ekg ordered today demonstrates NSR with SA, 95 bpm, LBBB seen on previous EKGs, otherwise nothin acute.    Nuclear medicine stress test on October 28, 2021:   The study is normal. The study is low risk.   No ST deviation was noted. Left bundle branch block was seen at baseline.   LV perfusion is normal. There is no evidence of ischemia. There is no evidence of infarction.   Left ventricular function is normal. Nuclear stress EF: 50 %. The left ventricular ejection fraction is mildly decreased (45-54%). End diastolic cavity size is normal. End systolic cavity size is normal.   Prior study not available for comparison.   2D complete echo on October 05, 2021:  1. Left ventricular ejection fraction, by estimation, is 50 to 55%. The  left ventricle has low normal function. The left ventricle has no regional  wall motion abnormalities. Left ventricular diastolic parameters are  consistent with Grade I diastolic  dysfunction (impaired  relaxation).   2. Right ventricular systolic function is normal. The right ventricular  size is normal.  3. The pericardial effusion is localized near the right atrium. There is  no evidence of cardiac tamponade.   4. The mitral valve is normal in structure. No evidence of mitral valve  regurgitation. No evidence of mitral stenosis.   5. The aortic valve is normal in structure. Aortic valve regurgitation is  not visualized. No aortic stenosis is present.   6. The inferior vena cava is normal in size with greater than 50%  respiratory variability, suggesting right atrial pressure of 3 mmHg.    Coronary CTA on September 08, 2019: 1. Coronary calcium score of 208. This was 30 percentile for age and sex matched control.   2. Normal coronary origin with right dominance.   3. CAD-RADS 2. Mild non-obstructive CAD (25-49%). Consider non-atherosclerotic causes of chest pain. Consider preventive therapy and risk factor modification.   4. Mildly dilated pulmonary artery measuring 32 mm. No significant extracardiac findings.    Recent Labs: 10/09/2021: ALT 28 09/19/2022: BUN 10; Creatinine, Ser 0.62; Hemoglobin 13.3; Platelets 235; Potassium 3.8; Sodium 142  Recent Lipid Panel    Component Value Date/Time   CHOL 131 04/26/2020 1610   TRIG 94 04/26/2020 1610   HDL 59 04/26/2020 1610   CHOLHDL 2.2 04/26/2020 1610   CHOLHDL 4.7 09/09/2019 0329   VLDL 18 09/09/2019 0329   LDLCALC 54 04/26/2020 1610     Physical Exam:    VS:  BP 124/70 (BP Location: Left Arm, Patient Position: Sitting, Cuff Size: Normal)   Pulse 95   Ht '4\' 10"'$  (1.473 m)   Wt 168 lb 12.8 oz (76.6 kg)   BMI 35.28 kg/m     Wt Readings from Last 3 Encounters:  09/22/22 168 lb 12.8 oz (76.6 kg)  09/19/22 165 lb (74.8 kg)  05/26/22 165 lb 3.2 oz (74.9 kg)     GEN: Obese, 81 y.o. Caucasian female in NAD HEENT: Normal NECK: No JVD; No carotid bruits CARDIAC: S1/S2, RRR, no murmurs, rubs, gallops; 2+ radial pulses,  strong and equal bilaterally, 1+ PT pulses, strong and equal bilaterally RESPIRATORY:  Clear and diminished to auscultation without rales, wheezing or rhonchi  MUSCULOSKELETAL:  2+ edema along BLE; No deformity  SKIN: Thin skin, no bruising, skin tears, injuries or skin abnormalities, Warm and dry NEUROLOGIC:  Alert and oriented x 3 PSYCHIATRIC:  Normal, pleasant affect, tearful at times  ASSESSMENT:    1. Palpitations   2. Coronary artery disease involving native heart with angina pectoris and documented spasm, unspecified vessel or lesion type ([place])   3. Prinzmetal angina ([place])   4. Aortic atherosclerosis (HCC)   5. Stress   6. Anxiety   7. Prediabetes   8. Obesity (BMI 30-39.9)    PLAN:    In order of problems listed above:  Palpitations Said this occurred upon awakening Tuesday morning.  Did resolve and went away within about an hour.  Denies any recurrent palpitations or tachycardia since, has never happened before.  EKG reveals normal sinus rhythm with sinus arrhythmia, 95 bpm.  Lab work from ED unremarkable.  Will obtain TSH and magnesium within the following week.  At next follow-up visit, if she still is having palpitations or fast heart rates, will consider heart monitor.  ED precautions discussed.  CAD, history pf Prinzmetal's angina, aortic atherosclerosis Previous CT coronary showed nonobstructive CAD. Recent thorough work-up in ED unremarkable.  Troponins negative x 2.  Recent CT angio of chest did not show aortic injury, PE, or dissection.  Aortic atherosclerosis noted and  mild coronary calcification seen.  Chest x-ray negative for any acute abnormality.  EKG in ED and EKG today did not show ischemic changes.  Symptoms similar to previous episode of Prinzmetal's angina.  Discussed further ischemic evaluation including nuclear stress test or discussing cardiac cath, however she defers any further ischemic evaluation and is requesting medical therapy at this time.  We discussed  that etiology at this time is most likely due to recent significant stress that she reports to me, which could be causing vasospasms.  We will increase Imdur from 15 mg to 30 mg daily and refill her nitroglycerin.  Last lipid profile on record shows LDL 135 in 2020, total cholesterol 131 in 2021.  Will obtain FLP and LFT next week when she is fasting along with labs mentioned above. Heart healthy diet and regular cardiovascular exercise encouraged.  ED precautions discussed. Continue ASA and Crestor, not on BB d/t low blood pressure.   Stress, anxiety Tearful during interview at times. Significant recent stress reported by her that is most likely causing recent episode of chest pain.  Increasing Imdur as mentioned above.  Encouraged her to practice mindfulness and ways to relieve stress.  Ambulatory referral to psychology placed and have referred to social worker today.   Prediabetes A1c in 2020 was 5.9%.  She is due for this to be repeated.  PCP to manage. Heart healthy diet and regular cardiovascular exercise encouraged.   5. Obesity  BMI 35.28. Weight loss via diet and exercise encouraged. Discussed the impact being overweight would have on cardiovascular risk.  5.  Disposition: Follow-up with Dr. Harrell Gave or Laurann Montana, NP in 3 to 4 weeks or sooner if anything changes.  Medication Adjustments/Labs and Tests Ordered: Current medicines are reviewed at length with the patient today.  Concerns regarding medicines are outlined above.  Orders Placed This Encounter  Procedures   Lipid panel   Hepatic function panel   Magnesium   TSH   Ambulatory referral to Psychology   EKG 12-Lead   Meds ordered this encounter  Medications   isosorbide mononitrate (IMDUR) 30 MG 24 hr tablet    Sig: Take 1 tablet (30 mg total) by mouth daily.    Dispense:  90 tablet    Refill:  3   nitroGLYCERIN (NITROSTAT) 0.4 MG SL tablet    Sig: Place 1 tablet (0.4 mg total) under the tongue every 5 (five)  minutes as needed for chest pain.    Dispense:  25 tablet    Refill:  4    Patient Instructions  Medication Instructions:  Your physician has recommended you make the following change in your medication:   Change: Imdur '30mg'$  daily   We have sent refills of your nitroglycerin today.  For as needed Nitroglycerin, if you develop chest pain: Sit and rest 5 minutes. If chest pain does not resolve place 1 nitroglycerin under your tongue and wait 5 minutes. If chest pain does not resolve, place a 2nd nitroglycerin under your tongue and wait 5 more minutes. If chest pain does not resolve, place a 3rd nitroglycerin under your tongue and seek emergency services.   *If you need a refill on your cardiac medications before your next appointment, please call your pharmacy*   Lab Work: Please return for Lab work within the next week or two for fasting Lipid panel, liver function tests, TSH, and Magnesium.. You may come to the...   [facility] (3rd floor) [address] [place], [place] [address]  Open: 8am-Noon  and 1pm-4:30pm  Please ring the doorbell on the small table when you exit the elevator and the Lab Tech will come get you  [place] at [facility] [address] [place], [place], [place] [address] Open: 8am-1pm, then 2pm-4:30pm   [place]- Please see attached locations sheet stapled to your lab work with address and hours.   If you have labs (blood work) drawn today and your tests are completely normal, you will receive your results only by: [place] (if you have MyChart) OR A paper copy in the mail If you have any lab test that is abnormal or we need to change your treatment, we will call you to review the results.  Follow-Up: At [facility], you and your health needs are our priority.  As part of our continuing mission to provide you with exceptional heart care, we have created designated Provider Care Teams.  These Care  Teams include your primary Cardiologist (physician) and Advanced Practice Providers (APPs -  Physician Assistants and Nurse Practitioners) who all work together to provide you with the care you need, when you need it.  We recommend signing up for the patient portal called "MyChart".  Sign up information is provided on this After Visit Summary.  MyChart is used to connect with patients for Virtual Visits (Telemedicine).  Patients are able to view lab/test results, encounter notes, upcoming appointments, etc.  Non-urgent messages can be sent to your provider as well.   To learn more about what you can do with MyChart, go to [email or web address].    Your next appointment:   3-4 week(s)  The format for your next appointment:   In Person  Provider:   Buford Dresser, MD or Laurann Montana, NP   Other Instructions Heart Healthy Diet Recommendations: A low-salt diet is recommended. Meats should be grilled, baked, or boiled. Avoid fried foods. Focus on lean protein sources like fish or chicken with vegetables and fruits. The American Heart Association is a Microbiologist!  American Heart Association Diet and Lifeystyle Recommendations   Exercise recommendations: The American Heart Association recommends 150 minutes of moderate intensity exercise weekly. Try 30 minutes of moderate intensity exercise 4-5 times per week. This could include walking, jogging, or swimming.          Signed, Finis Bud, NP  09/23/2022 4:42 PM    [place]

## 2022-09-22 NOTE — Patient Instructions (Signed)
Medication Instructions:  Your physician has recommended you make the following change in your medication:   Change: Imdur '30mg'$  daily   We have sent refills of your nitroglycerin today.  For as needed Nitroglycerin, if you develop chest pain: Sit and rest 5 minutes. If chest pain does not resolve place 1 nitroglycerin under your tongue and wait 5 minutes. If chest pain does not resolve, place a 2nd nitroglycerin under your tongue and wait 5 more minutes. If chest pain does not resolve, place a 3rd nitroglycerin under your tongue and seek emergency services.   *If you need a refill on your cardiac medications before your next appointment, please call your pharmacy*   Lab Work: Please return for Lab work within the next week or two for fasting Lipid panel, liver function tests, TSH, and Magnesium.. You may come to the...   [facility] (3rd floor) [address] [place], [place] [address]  Open: 8am-Noon and 1pm-4:30pm  Please ring the doorbell on the small table when you exit the elevator and the Lab Tech will come get you  [place] at [facility] [address] [place], [place], [place] [address] Open: 8am-1pm, then 2pm-4:30pm   [place]- Please see attached locations sheet stapled to your lab work with address and hours.   If you have labs (blood work) drawn today and your tests are completely normal, you will receive your results only by: [place] (if you have MyChart) OR A paper copy in the mail If you have any lab test that is abnormal or we need to change your treatment, we will call you to review the results.  Follow-Up: At [facility], you and your health needs are our priority.  As part of our continuing mission to provide you with exceptional heart care, we have created designated Provider Care Teams.  These Care Teams include your primary Cardiologist (physician) and Advanced Practice Providers (APPs -  Physician  Assistants and Nurse Practitioners) who all work together to provide you with the care you need, when you need it.  We recommend signing up for the patient portal called "MyChart".  Sign up information is provided on this After Visit Summary.  MyChart is used to connect with patients for Virtual Visits (Telemedicine).  Patients are able to view lab/test results, encounter notes, upcoming appointments, etc.  Non-urgent messages can be sent to your provider as well.   To learn more about what you can do with MyChart, go to [email or web address].    Your next appointment:   3-4 week(s)  The format for your next appointment:   In Person  Provider:   Buford Dresser, MD or Laurann Montana, NP   Other Instructions Heart Healthy Diet Recommendations: A low-salt diet is recommended. Meats should be grilled, baked, or boiled. Avoid fried foods. Focus on lean protein sources like fish or chicken with vegetables and fruits. The American Heart Association is a Microbiologist!  American Heart Association Diet and Lifeystyle Recommendations   Exercise recommendations: The American Heart Association recommends 150 minutes of moderate intensity exercise weekly. Try 30 minutes of moderate intensity exercise 4-5 times per week. This could include walking, jogging, or swimming.

## 2022-09-23 DIAGNOSIS — R002 Palpitations: Secondary | ICD-10-CM | POA: Insufficient documentation

## 2022-09-23 DIAGNOSIS — I700 Atherosclerosis of aorta: Secondary | ICD-10-CM | POA: Insufficient documentation

## 2022-09-23 DIAGNOSIS — I201 Angina pectoris with documented spasm: Secondary | ICD-10-CM | POA: Insufficient documentation

## 2022-09-23 DIAGNOSIS — I447 Left bundle-branch block, unspecified: Secondary | ICD-10-CM | POA: Insufficient documentation

## 2022-09-23 DIAGNOSIS — E669 Obesity, unspecified: Secondary | ICD-10-CM | POA: Insufficient documentation

## 2022-09-23 DIAGNOSIS — F439 Reaction to severe stress, unspecified: Secondary | ICD-10-CM | POA: Insufficient documentation

## 2022-09-25 ENCOUNTER — Telehealth (HOSPITAL_COMMUNITY): Payer: Self-pay | Admitting: Licensed Clinical Social Worker

## 2022-09-25 NOTE — Telephone Encounter (Signed)
CSW consulted to speak with pt regarding some concerns expressed during clinic visit- CSW reached out to pt to discuss- unable to reach- left VM requesting return call  Jorge Ny, [place] Worker [facility] Desk#: [phone] Cell#: [phone]

## 2022-09-27 ENCOUNTER — Telehealth (HOSPITAL_COMMUNITY): Payer: Self-pay | Admitting: Licensed Clinical Social Worker

## 2022-09-27 NOTE — Telephone Encounter (Signed)
CSW attempted to reach patient to discuss concerns expressed during clinic visit last week- unable to reach- phone went straight to VM so left message requesting return call.  Jorge Ny, LCSW Clinical Social Worker [facility] Desk#: [phone] Cell#: [phone]

## 2022-09-28 NOTE — Telephone Encounter (Signed)
Pt returned call and left a message stating that she does not think she needs assistance at this time but has my phone number if things change and she needs further support.  Jorge Ny, LCSW Clinical Social Worker [facility] Desk#: [phone] Cell#: [phone]

## 2022-09-30 LAB — HEPATIC FUNCTION PANEL
ALT: 12 IU/L (ref 0–32)
AST: 18 IU/L (ref 0–40)
Albumin: 4.2 g/dL (ref 3.7–4.7)
Alkaline Phosphatase: 83 IU/L (ref 44–121)
Bilirubin Total: 0.3 mg/dL (ref 0.0–1.2)
Bilirubin, Direct: <0.1 mg/dL (ref 0.00–0.40)
Total Protein: 6.2 g/dL (ref 6.0–8.5)

## 2022-09-30 LAB — LIPID PANEL
Chol/HDL Ratio: 2 ratio (ref 0.0–4.4)
Cholesterol, Total: 146 mg/dL (ref 100–199)
HDL: 73 mg/dL (ref >39)
LDL Chol Calc (NIH): 55 mg/dL (ref 0–99)
Triglycerides: 96 mg/dL (ref 0–149)
VLDL Cholesterol Cal: 18 mg/dL (ref 5–40)

## 2022-09-30 LAB — TSH: TSH: 1.83 u[IU]/mL (ref 0.450–4.500)

## 2022-09-30 LAB — MAGNESIUM: Magnesium: 2.3 mg/dL (ref 1.6–2.3)

## 2022-10-16 ENCOUNTER — Ambulatory Visit (HOSPITAL_BASED_OUTPATIENT_CLINIC_OR_DEPARTMENT_OTHER): Payer: Medicare Other | Admitting: Cardiology

## 2022-11-01 ENCOUNTER — Ambulatory Visit: Payer: Medicare Other | Admitting: Psychologist

## 2022-11-15 ENCOUNTER — Ambulatory Visit (INDEPENDENT_AMBULATORY_CARE_PROVIDER_SITE_OTHER): Payer: Medicare Other | Admitting: Cardiology

## 2022-11-15 ENCOUNTER — Encounter (HOSPITAL_BASED_OUTPATIENT_CLINIC_OR_DEPARTMENT_OTHER): Payer: Self-pay | Admitting: Cardiology

## 2022-11-15 VITALS — BP 118/64 | HR 76 | Ht <= 58 in | Wt 175.7 lb

## 2022-11-15 DIAGNOSIS — R0989 Other specified symptoms and signs involving the circulatory and respiratory systems: Secondary | ICD-10-CM

## 2022-11-15 DIAGNOSIS — I5189 Other ill-defined heart diseases: Secondary | ICD-10-CM

## 2022-11-15 DIAGNOSIS — I2510 Atherosclerotic heart disease of native coronary artery without angina pectoris: Secondary | ICD-10-CM

## 2022-11-15 DIAGNOSIS — I201 Angina pectoris with documented spasm: Secondary | ICD-10-CM

## 2022-11-15 DIAGNOSIS — Z7189 Other specified counseling: Secondary | ICD-10-CM

## 2022-11-15 NOTE — Patient Instructions (Signed)
Medication Instructions:  Your physician recommends that you continue on your current medications as directed. Please refer to the Current Medication list given to you today.  *If you need a refill on your cardiac medications before your next appointment, please call your pharmacy*  Follow-Up: At [facility], you and your health needs are our priority.  As part of our continuing mission to provide you with exceptional heart care, we have created designated Provider Care Teams.  These Care Teams include your primary Cardiologist (physician) and Advanced Practice Providers (APPs -  Physician Assistants and Nurse Practitioners) who all work together to provide you with the care you need, when you need it.  We recommend signing up for the patient portal called "MyChart".  Sign up information is provided on this After Visit Summary.  MyChart is used to connect with patients for Virtual Visits (Telemedicine).  Patients are able to view lab/test results, encounter notes, upcoming appointments, etc.  Non-urgent messages can be sent to your provider as well.   To learn more about what you can do with MyChart, go to [email or web address].    Your next appointment:   1 year(s)  The format for your next appointment:   In Person  Provider:   Buford Dresser, MD

## 2022-11-15 NOTE — Progress Notes (Signed)
Cardiology Office Note:    Date:  11/15/2022   ID:  Kristin Roach, DOB [birth year], MRN [id number]  PCP:  Jettie Booze, NP  Cardiologist:  Buford Dresser, MD PhD  Referring MD: Jettie Booze, NP   CC: follow up  History of Present Illness:    Kristin Roach is a 81 y.o. female with a hx of OSA, asthma, arthritis, Prinzmetal's angina, being seen for follow-up. I initially met her 07/30/18 as a new consult.  Cardiac history: Saw Dr. Bayard Hugger in [place], [place] (Fax [phone]). Notes states: Heart murmur, mitral valve prolapse; sleep apnea, uses CPAP. Dr. Joaquim Lai at [facility] was her internal medicine physician. Thinks her last stress test was around 2012. Echo in 2017. Had prior heart caths in the early 1990s, did not require stents but does report that she had vasconstriction with ergotamine.  Admitted 09/07/19 for chest pain. Seen by Dr. Irish Lack in the hospital. Had CT coronary with nonobstructive disease.  At her last visit, she had endorsed a stutter and a "slowness" to find the right words. She also has a hearing problem. Her blood pressure had increased during that time. She also had allergy-related sinus issues and migraines.  Today, she is feeling much better since her last appointment. She has not had any recurring chest pain. There was only her previous isolated episode. Currently she is on long acting Imdur which is helping.  Additionally she complains of a chronic cough which she feels is due to allergies.  She denies any palpitations, shortness of breath, or peripheral edema. No lightheadedness, headaches, syncope, orthopnea, or PND.    Past Medical History:  Diagnosis Date   Angina at rest    Anxiety    Arthritis    Asthma    bronchial asthma   CAD (coronary artery disease)    Cancer (HCC)    Cataracts, bilateral    Chest pain    Complication of anesthesia    woke up during back procedure   COVID    HOH (hard of hearing)    Lymphedema of both  lower extremities    Pneumonia    hx of 2012   PONV (postoperative nausea and vomiting)    with knee surgery in 11 2015   RLS (restless legs syndrome)    Sleep apnea    cpap    Past Surgical History:  Procedure Laterality Date   ABDOMINAL HYSTERECTOMY     BACK SURGERY     laminectomy   BELPHAROPTOSIS REPAIR     CARDIAC CATHETERIZATION     CESAREAN SECTION     CHOLECYSTECTOMY     COLONOSCOPY  2014   sigmoid hyperplastic polyps   HERNIA REPAIR     SHOULDER SURGERY     removal of bone spur   TONSILLECTOMY     TOTAL KNEE ARTHROPLASTY Left 10/26/2014   Procedure: LEFT TOTAL KNEE ARTHROPLASTY;  Surgeon: Kerin Salen, MD;  Location: [facility];  Service: Orthopedics;  Laterality: Left;    Current Medications: Current Outpatient Medications on File Prior to Visit  Medication Sig   aspirin 81 MG tablet Take 81 mg by mouth daily.   citalopram (CELEXA) 10 MG tablet Take 10 mg by mouth daily.   diazepam (VALIUM) 5 MG tablet Take 5 mg by mouth as needed for anxiety (when she takes nitroglycerin).    fluticasone (FLONASE) 50 MCG/ACT nasal spray Place 1 spray into both nostrils daily.   fluticasone-salmeterol (ADVAIR) 100-50 MCG/ACT AEPB Inhale 1  puff into the lungs 2 (two) times daily.   hydrochlorothiazide (HYDRODIURIL) 12.5 MG tablet Take 12.5 mg by mouth as needed.   HYDROcodone-acetaminophen (NORCO) 7.5-325 MG tablet Take 1 tablet by mouth as needed for moderate pain. Pt takes 1 tablet by mouth 4(four) times a day as needed.   hydrocortisone 2.5 % cream Apply topically.   isosorbide mononitrate (IMDUR) 30 MG 24 hr tablet Take 1 tablet (30 mg total) by mouth daily.   methocarbamol (ROBAXIN) 500 MG tablet Take 1 tablet by mouth every six hours as needed for spasms   Misc. Devices MISC Nebulizer for home use with tubing and mouthpiece/pipe or mask Dx: wheezing, shortness of breath post COvid   montelukast (SINGULAIR) 10 MG tablet Take 1 tablet (10 mg total) by mouth at bedtime.    nitroGLYCERIN (NITROSTAT) 0.4 MG SL tablet Place 1 tablet (0.4 mg total) under the tongue every 5 (five) minutes as needed for chest pain.   pantoprazole (PROTONIX) 40 MG tablet Take 1 tablet (40 mg total) by mouth daily. Take 30 minutes before breakfast.   rOPINIRole (REQUIP) 1 MG tablet Take 1 mg by mouth at bedtime.    rosuvastatin (CRESTOR) 40 MG tablet TAKE ONE TABLET BY MOUTH DAILY   No current facility-administered medications on file prior to visit.     Allergies:   Epinephrine, Ergonovine, Flagyl [metronidazole], Demerol [meperidine], Erythromycin, and Percocet [oxycodone-acetaminophen]   Social History   Tobacco Use   Smoking status: Former    Types: Cigarettes   Smokeless tobacco: Never  Vaping Use   Vaping Use: Never used  Substance Use Topics   Alcohol use: Not Currently    Alcohol/week: 14.0 standard drinks of alcohol    Types: 14 Glasses of wine per week   Drug use: No   Family history: mother passed from MI in age 62, father died of leukemia at age 5. Two grandparents died from MI. Sister just died of lung cancer at age 17. No other siblings. Son and daughter both have terrible migraines. Daughter was a methamphetamine user, had been sober for 8 years but has some residual issues.   ROS:   Please see the history of present illness.   (+) Cough (+) Seasonal allergies Additional pertinent ROS otherwise unremarkable.    EKGs/Labs/Other Studies Reviewed:    The following studies were reviewed today:  Exercise Stress Test 10/28/21:    The study is normal. The study is low risk.   No ST deviation was noted. Left bundle branch block was seen at baseline.   LV perfusion is normal. There is no evidence of ischemia. There is no evidence of infarction.   Left ventricular function is normal. Nuclear stress EF: 50 %. The left ventricular ejection fraction is mildly decreased (45-54%). End diastolic cavity size is normal. End systolic cavity size is normal.   Prior  study not available for comparison.  Echocardiogram 10/05/21:  IMPRESSIONS     1. Left ventricular ejection fraction, by estimation, is 50 to 55%. The  left ventricle has low normal function. The left ventricle has no regional  wall motion abnormalities. Left ventricular diastolic parameters are  consistent with Grade I diastolic  dysfunction (impaired relaxation).   2. Right ventricular systolic function is normal. The right ventricular  size is normal.   3. The pericardial effusion is localized near the right atrium. There is  no evidence of cardiac tamponade.   4. The mitral valve is normal in structure. No evidence of mitral valve  regurgitation. No evidence of mitral stenosis.   5. The aortic valve is normal in structure. Aortic valve regurgitation is  not visualized. No aortic stenosis is present.   6. The inferior vena cava is normal in size with greater than 50%  respiratory variability, suggesting right atrial pressure of 3 mmHg.   CT Angio Chest 09/19/2022: IMPRESSION: No evidence of acute aortic abnormality. No pulmonary emboli are seen.   Diffuse heterogeneity of the thyroid without discrete nodule.   Aortic Atherosclerosis (ICD10-I70.0).   CT Angio Chest 08/17/21:  IMPRESSION: No evidence of pulmonary embolism.   No evidence of acute cardiopulmonary disease.   Aortic Atherosclerosis (ICD10-I70.0).   CT 09/09/19 Aorta:  Normal size.  Mild diffuse calcifications.  No dissection.   Aortic Valve:  Trileaflet.  Mild calcifications.   Coronary Arteries:  Normal coronary origin.  Right dominance.   RCA is a large dominant artery that gives rise to PDA and PLA. There are only minimal irregularities.   Left main is a large artery that gives rise to LAD and LCX arteries.   LAD is a large vessel that has mild calcified plaque in the proximal and mid portion with stenosis 25-49%.   LCX is a large non-dominant artery that gives rise to one large OM1 branch.  There are only minimal irregularities.   OM1 is a very large branch that has no plaque.   Other findings:   Normal pulmonary vein drainage into the left atrium.   Normal left atrial appendage without a thrombus.   IMPRESSION: 1. Coronary calcium score of 208. This was 32 percentile for age and sex matched control.   2. Normal coronary origin with right dominance.   3. CAD-RADS 2. Mild non-obstructive CAD (25-49%). Consider non-atherosclerotic causes of chest pain. Consider preventive therapy and risk factor modification.   4. Mildly dilated pulmonary artery measuring 32 mm.  Echo 09/08/19  1. Left ventricular ejection fraction, by visual estimation, is 40 to 45%. The left ventricle has normal function. Normal left ventricular size. There is no left ventricular hypertrophy.  2. Left ventricular diastolic Doppler parameters are consistent with impaired relaxation pattern of LV diastolic filling.  3. Global right ventricle has normal systolic function.The right ventricular size is normal. No increase in right ventricular wall thickness.  4. Left atrial size was normal.  5. Right atrial size was normal.  6. The mitral valve is normal in structure. Mild mitral valve regurgitation. No evidence of mitral stenosis.  7. The tricuspid valve is normal in structure. Tricuspid valve regurgitation is trivial.  8. The aortic valve is normal in structure. Aortic valve regurgitation was not visualized by color flow Doppler. Structurally normal aortic valve, with no evidence of sclerosis or stenosis.  9. The pulmonic valve was normal in structure. Pulmonic valve regurgitation is not visualized by color flow Doppler. 10. Normal pulmonary artery systolic pressure. 11. The inferior vena cava is normal in size with greater than 50% respiratory variability, suggesting right atrial pressure of 3 mmHg.   EKG:  EKG is personally reviewed today. 11/15/2022: EKG was not ordered. 05/26/22: NSR at 73 bpm,  LBBB 05/25/2021: EKG is not ordered today. 11/01/2020: normal sinus rhythm, LBBB at 62 bpm  Recent Labs: 09/19/2022: BUN 10; Creatinine, Ser 0.62; Hemoglobin 13.3; Platelets 235; Potassium 3.8; Sodium 142 09/29/2022: ALT 12; Magnesium 2.3; TSH 1.830  Recent Lipid Panel    Component Value Date/Time   CHOL 146 09/29/2022 1128   TRIG 96 09/29/2022 1128   HDL 73 09/29/2022  1128   CHOLHDL 2.0 09/29/2022 1128   CHOLHDL 4.7 09/09/2019 0329   VLDL 18 09/09/2019 0329   LDLCALC 55 09/29/2022 1128    Physical Exam:    VS:  BP 118/64 (BP Location: Left Arm, Patient Position: Sitting, Cuff Size: Normal)   Pulse 76   Ht $R'4\' 10"'mM$  (1.473 m)   Wt 175 lb 11.2 oz (79.7 kg)   BMI 36.72 kg/m     Wt Readings from Last 3 Encounters:  11/15/22 175 lb 11.2 oz (79.7 kg)  09/22/22 168 lb 12.8 oz (76.6 kg)  09/19/22 165 lb (74.8 kg)    GEN: Well nourished, well developed in no acute distress HEENT: Normal, moist mucous membranes NECK: No JVD CARDIAC: regular rhythm, normal S1 and S2, no rubs or gallops. No murmur. VASCULAR: Radial and DP pulses 2+ bilaterally. No carotid bruits RESPIRATORY:  Clear to auscultation without rales or wheezing ABDOMEN: Soft, non-tender, non-distended MUSCULOSKELETAL:  Ambulates independently SKIN: Warm and dry, no significant edema NEUROLOGIC:  Alert and oriented x 3. No focal neuro deficits noted. PSYCHIATRIC:  Normal affect   ASSESSMENT:    1. Nonocclusive coronary atherosclerosis of native coronary artery   2. Prinzmetal angina ([place])   3. Systolic dysfunction without heart failure   4. Labile blood pressure   5. Counseling on health promotion and disease prevention    PLAN:    Nonobstructive CAD, history of Prinzmetal's angina, prior admission for chest pain:  -CT coronary with nonobstructive CAD -feels much better since starting imdur, tolerating well, no recent chest pain -on aspirin 81 mg daily, tolerating -continue rosuvastatin 40 mg daily,  tolerating -we have previously discussed recommended avoidance of NSAIDs in CAD -has SL NG, no recent use -counseled on red flag warning signs that need immediate medical attention -no longer on beta blocker due to low blood pressure  Systolic dysfunction without clinical heart failure -EF 40-45% in 202, most recent 50-55% 09/2021 -nonobstructive CAD on CT coronary -no longer on beta blocker due to hypotension, also prevents ACEi/ARB/ARNI.   Labile BP: -has been variable, but largely normal range on imdur only. Has history of hypotension in the past.  Cardiovascular risk counseling and prevention: -recommend heart healthy/Mediterranean diet, with whole grains, fruits, vegetable, fish, lean meats, nuts, and olive oil. Limit salt. -recommend moderate walking, 3-5 times/week for 30-50 minutes each session. Aim for at least 150 minutes.week. Goal should be pace of 3 miles/hours, or walking 1.5 miles in 30 minutes -recommend avoidance of tobacco products. Avoid excess alcohol.  Plan for follow up: 1 year or sooner PRN.  Medication Adjustments/Labs and Tests Ordered: Current medicines are reviewed at length with the patient today.  Concerns regarding medicines are outlined above.  No orders of the defined types were placed in this encounter.  No orders of the defined types were placed in this encounter.   Patient Instructions  Medication Instructions:  Your physician recommends that you continue on your current medications as directed. Please refer to the Current Medication list given to you today.  *If you need a refill on your cardiac medications before your next appointment, please call your pharmacy*  Follow-Up: At [facility], you and your health needs are our priority.  As part of our continuing mission to provide you with exceptional heart care, we have created designated Provider Care Teams.  These Care Teams include your primary Cardiologist (physician) and Advanced  Practice Providers (APPs -  Physician Assistants and Nurse Practitioners) who all work together to provide  you with the care you need, when you need it.  We recommend signing up for the patient portal called "MyChart".  Sign up information is provided on this After Visit Summary.  MyChart is used to connect with patients for Virtual Visits (Telemedicine).  Patients are able to view lab/test results, encounter notes, upcoming appointments, etc.  Non-urgent messages can be sent to your provider as well.   To learn more about what you can do with MyChart, go to [email or web address].    Your next appointment:   1 year(s)  The format for your next appointment:   In Person  Provider:   Buford Dresser, MD        I,Mitra Faeizi,acting as a scribe for Buford Dresser, MD.,have documented all relevant documentation on the behalf of Buford Dresser, MD,as directed by  Buford Dresser, MD while in the presence of Buford Dresser, MD.   I, Buford Dresser, MD, have reviewed all documentation for this visit. The documentation on 11/15/22 for the exam, diagnosis, procedures, and orders are all accurate and complete.  Signed, Buford Dresser, MD PhD 11/15/2022   [place]

## 2023-01-21 ENCOUNTER — Encounter (HOSPITAL_BASED_OUTPATIENT_CLINIC_OR_DEPARTMENT_OTHER): Payer: Self-pay | Admitting: Cardiology

## 2023-06-27 ENCOUNTER — Other Ambulatory Visit (HOSPITAL_BASED_OUTPATIENT_CLINIC_OR_DEPARTMENT_OTHER): Payer: Self-pay | Admitting: Cardiology

## 2023-06-27 DIAGNOSIS — F419 Anxiety disorder, unspecified: Secondary | ICD-10-CM

## 2023-06-27 DIAGNOSIS — I201 Angina pectoris with documented spasm: Secondary | ICD-10-CM

## 2023-06-27 DIAGNOSIS — F439 Reaction to severe stress, unspecified: Secondary | ICD-10-CM

## 2023-06-27 NOTE — Telephone Encounter (Signed)
Rx(s) sent to pharmacy electronically.  

## 2023-09-24 ENCOUNTER — Other Ambulatory Visit (HOSPITAL_BASED_OUTPATIENT_CLINIC_OR_DEPARTMENT_OTHER): Payer: Self-pay | Admitting: Cardiology

## 2023-09-24 DIAGNOSIS — F419 Anxiety disorder, unspecified: Secondary | ICD-10-CM

## 2023-09-24 DIAGNOSIS — I201 Angina pectoris with documented spasm: Secondary | ICD-10-CM

## 2023-09-24 DIAGNOSIS — F439 Reaction to severe stress, unspecified: Secondary | ICD-10-CM

## 2023-11-02 ENCOUNTER — Other Ambulatory Visit: Payer: Self-pay

## 2023-11-02 ENCOUNTER — Emergency Department (HOSPITAL_BASED_OUTPATIENT_CLINIC_OR_DEPARTMENT_OTHER): Payer: Medicare Other | Admitting: Radiology

## 2023-11-02 ENCOUNTER — Emergency Department (HOSPITAL_BASED_OUTPATIENT_CLINIC_OR_DEPARTMENT_OTHER)
Admission: EM | Admit: 2023-11-02 | Discharge: 2023-11-02 | Disposition: A | Discharge status: Home / Self Care | Payer: Medicare Other

## 2023-11-02 DIAGNOSIS — Z7982 Long term (current) use of aspirin: Secondary | ICD-10-CM | POA: Diagnosis not present

## 2023-11-02 DIAGNOSIS — R600 Localized edema: Secondary | ICD-10-CM | POA: Insufficient documentation

## 2023-11-02 DIAGNOSIS — R079 Chest pain, unspecified: Secondary | ICD-10-CM | POA: Diagnosis present

## 2023-11-02 LAB — BASIC METABOLIC PANEL
Anion gap: 8 (ref 5–15)
BUN: 13 mg/dL (ref 8–23)
CO2: 29 mmol/L (ref 22–32)
Calcium: 9.1 mg/dL (ref 8.9–10.3)
Chloride: 102 mmol/L (ref 98–111)
Creatinine, Ser: 0.65 mg/dL (ref 0.44–1.00)
GFR, Estimated: >60 mL/min (ref >60)
Glucose, Bld: 102 mg/dL — ABNORMAL HIGH (ref 70–99)
Potassium: 3.7 mmol/L (ref 3.5–5.1)
Sodium: 139 mmol/L (ref 135–145)

## 2023-11-02 LAB — CBC
HCT: 40.4 % (ref 36.0–46.0)
Hemoglobin: 13.1 g/dL (ref 12.0–15.0)
MCH: 28.5 pg (ref 26.0–34.0)
MCHC: 32.4 g/dL (ref 30.0–36.0)
MCV: 87.8 fL (ref 80.0–100.0)
Platelets: 285 10*3/uL (ref 150–400)
RBC: 4.6 MIL/uL (ref 3.87–5.11)
RDW: 13.1 % (ref 11.5–15.5)
WBC: 8.4 10*3/uL (ref 4.0–10.5)
nRBC: 0 % (ref 0.0–0.2)

## 2023-11-02 LAB — TROPONIN I (HIGH SENSITIVITY)
Troponin I (High Sensitivity): 21 ng/L — ABNORMAL HIGH (ref <18)
Troponin I (High Sensitivity): 20 ng/L — ABNORMAL HIGH (ref <18)

## 2023-11-02 NOTE — Discharge Instructions (Addendum)
Please follow-up with cardiology.  Return immediately if you develop fevers, chills, worsening chest pain, shortness of breath, lightheadedness, palpitations or any new or worsening symptoms that are concerning to you.

## 2023-11-02 NOTE — ED Triage Notes (Signed)
Chest pain and SOB x3 days. Left flank and breast. Denies cough.  HX Prinzmetal's angina, CPAP.

## 2023-11-02 NOTE — ED Notes (Signed)
ED Provider at bedside. 

## 2023-11-02 NOTE — ED Notes (Signed)
 RN reviewed discharge instructions with pt. Pt verbalized understanding and had no further questions. VSS upon discharge.  

## 2023-11-02 NOTE — ED Provider Notes (Signed)
[place] EMERGENCY DEPARTMENT AT [facility] Provider Note   CSN: [id number] Arrival date & time: 11/02/23  1618     History  Chief Complaint  Patient presents with   Chest Pain    Kristin Roach is a 82 y.o. female.  Is an 82 year old female presenting emergency department with chest pain x 2 days.  Constant, but seemingly waxing and waning in intensity.  Started after she took a herbal supplement.  Pain is to the entire left side of chest.  Described as dull ache.  No associated shortness of breath or nausea vomiting.   Chest Pain      Home Medications Prior to Admission medications   Medication Sig Start Date End Date Taking? Authorizing Provider  aspirin 81 MG tablet Take 81 mg by mouth daily.    [provider]  citalopram (CELEXA) 10 MG tablet Take 10 mg by mouth daily. 10/14/20   [provider]  diazepam (VALIUM) 5 MG tablet Take 5 mg by mouth as needed for anxiety (when she takes nitroglycerin).  06/13/17   [provider]  fluticasone (FLONASE) 50 MCG/ACT nasal spray Place 1 spray into both nostrils daily. 11/15/21   Omar Person, MD  fluticasone-salmeterol (ADVAIR) 100-50 MCG/ACT AEPB Inhale 1 puff into the lungs 2 (two) times daily. 11/15/21   Omar Person, MD  hydrochlorothiazide (HYDRODIURIL) 12.5 MG tablet Take 12.5 mg by mouth as needed. 08/30/22   [provider]  HYDROcodone-acetaminophen (NORCO) 7.5-325 MG tablet Take 1 tablet by mouth as needed for moderate pain. Pt takes 1 tablet by mouth 4(four) times a day as needed.    [provider]  hydrocortisone 2.5 % cream Apply topically. 04/15/21   [provider]  isosorbide mononitrate (IMDUR) 30 MG 24 hr tablet TAKE 1/2 TABLET BY MOUTH DAILY 09/24/23   Jodelle Red, MD  methocarbamol (ROBAXIN) 500 MG tablet Take 1 tablet by mouth every six hours as needed for spasms 01/17/21   [provider]  Misc. Devices MISC Nebulizer  for home use with tubing and mouthpiece/pipe or mask Dx: wheezing, shortness of breath post COvid 09/12/21   [provider]  montelukast (SINGULAIR) 10 MG tablet Take 1 tablet (10 mg total) by mouth at bedtime. 11/15/21   Omar Person, MD  nitroGLYCERIN (NITROSTAT) 0.4 MG SL tablet Place 1 tablet (0.4 mg total) under the tongue every 5 (five) minutes as needed for chest pain. 09/22/22   Sharlene Dory, NP  pantoprazole (PROTONIX) 40 MG tablet Take 1 tablet (40 mg total) by mouth daily. Take 30 minutes before breakfast. 03/16/21   Iva Boop, MD  rOPINIRole (REQUIP) 1 MG tablet Take 1 mg by mouth at bedtime.  09/23/19   [provider]  rosuvastatin (CRESTOR) 40 MG tablet TAKE ONE TABLET BY MOUTH DAILY 09/15/22   Jodelle Red, MD      Allergies    Epinephrine, Ergonovine, Flagyl [metronidazole], Demerol [meperidine], Erythromycin, and Percocet [oxycodone-acetaminophen]    Review of Systems   Review of Systems  Cardiovascular:  Positive for chest pain.    Physical Exam Updated Vital Signs BP (!) 143/58   Pulse 62   Temp (!) 97.5 F (36.4 C) (Temporal)   Resp 17   Ht 4\' 10"  (1.473 m)   Wt 76.2 kg   SpO2 90%   BMI 35.11 kg/m  Physical Exam Vitals and nursing note reviewed.  Constitutional:      General: She is not in acute distress.  Appearance: She is not toxic-appearing.  HENT:     Head: Normocephalic.  Cardiovascular:     Rate and Rhythm: Normal rate and regular rhythm.     Heart sounds: Normal heart sounds.  Pulmonary:     Breath sounds: Normal breath sounds.  Musculoskeletal:     Right lower leg: Edema present.     Left lower leg: Edema present.  Skin:    General: Skin is warm.  Neurological:     Mental Status: She is alert and oriented to person, place, and time.     ED Results / Procedures / Treatments   Labs (all labs ordered are listed, but only abnormal results are displayed) Labs Reviewed  BASIC METABOLIC PANEL -  Abnormal; Notable for the following components:      Result Value   Glucose, Bld 102 (*)    All other components within normal limits  TROPONIN I (HIGH SENSITIVITY) - Abnormal; Notable for the following components:   Troponin I (High Sensitivity) 21 (*)    All other components within normal limits  TROPONIN I (HIGH SENSITIVITY) - Abnormal; Notable for the following components:   Troponin I (High Sensitivity) 20 (*)    All other components within normal limits  CBC    EKG EKG Interpretation Date/Time:  Friday November 02 2023 16:28:25 EST Ventricular Rate:  71 PR Interval:  164 QRS Duration:  122 QT Interval:  408 QTC Calculation: 443 R Axis:   -63  Text Interpretation: Normal sinus rhythm Left axis deviation Left bundle branch block Abnormal ECG When compared with ECG of 19-Sep-2022 17:25, Premature atrial complexes are no longer Present QRS axis Shifted right ST now depressed in Lateral leads Confirmed by Estanislado Pandy [phone]) on 11/02/2023 5:52:04 PM  Radiology DG Chest 2 View  Result Date: 11/02/2023 CLINICAL DATA:  Chest pain EXAM: CHEST - 2 VIEW COMPARISON:  09/19/2022. FINDINGS: The heart size and mediastinal contours are within normal limits. Both lungs are clear. No pneumothorax or pleural effusion. There are thoracic degenerative changes. IMPRESSION: No acute cardiopulmonary disease. Electronically Signed   By: Layla Maw M.D.   On: 11/02/2023 18:54    Procedures Procedures    Medications Ordered in ED Medications - No data to display  ED Course/ Medical Decision Making/ A&P Clinical Course as of 11/02/23 2314  Fri Nov 02, 2023  1752 Echo 10/05/21 per [place] review: "IMPRESSIONS     1. Left ventricular ejection fraction, by estimation, is 50 to 55%. The  left ventricle has low normal function. The left ventricle has no regional  wall motion abnormalities. Left ventricular diastolic parameters are  consistent with Grade I diastolic  dysfunction (impaired  relaxation).   2. Right ventricular systolic function is normal. The right ventricular  size is normal.   3. The pericardial effusion is localized near the right atrium. There is  no evidence of cardiac tamponade.   4. The mitral valve is normal in structure. No evidence of mitral valve  regurgitation. No evidence of mitral stenosis.   5. The aortic valve is normal in structure. Aortic valve regurgitation is  not visualized. No aortic stenosis is present.   6. The inferior vena cava is normal in size with greater than 50%  respiratory variability, suggesting right atrial pressure of 3 mmHg." " [TY]  1950 DG Chest 2 View IMPRESSION: No acute cardiopulmonary disease.   [TY]  2306 Spoke with Dr. Norwood Levo who does not think patient needs admission for chest pain workup and would  be good candidate for close cardiology follow up.  [TY]    Clinical Course User Index [TY] Coral Spikes, DO                                 Medical Decision Making Well-appearing 82 year old female presenting emergency department for chest pain.  She is afebrile nontachycardic hemodynamically stable.  EKG without ST segment changes to indicate ischemia.  Troponin mildly elevated, but subsequently flat.  Chest x-ray without pneumothorax pneumonia.  No significant metabolic derangements on BMP.  No leukocytosis to suggest systemic infection.  Case discussed with cardiology.  See ED course.  Patient to follow-up outpatient.  Stable for discharge at this time.  Amount and/or Complexity of Data Reviewed Labs: ordered. Radiology: ordered. Decision-making details documented in ED Course.          Final Clinical Impression(s) / ED Diagnoses Final diagnoses:  Chest pain, unspecified type    Rx / DC Orders ED Discharge Orders          Ordered    Ambulatory referral to Cardiology       Comments: If you have not heard from the Cardiology office within the next 72 hours please call [phone].   11/02/23  2308              Coral Spikes, DO 11/02/23 2314

## 2023-11-05 ENCOUNTER — Telehealth (HOSPITAL_BASED_OUTPATIENT_CLINIC_OR_DEPARTMENT_OTHER): Payer: Self-pay | Admitting: Cardiology

## 2023-11-05 NOTE — Telephone Encounter (Signed)
Spoke with patient regarding chest pain Stated she went to ED couple of days ago secondary to chest pain Today she has been having chest pain again Took 3 NTG and after 3rd NTG chest was hurting even worse and made statement she thought she was having heart attack. After a while CP improved  Chest pain has returned although not as bad as it was.  Discussed with Dr Cristal Deer who recommended patient going back to ED if she was currently having chest pain.  Advised patient, verbalized understanding.

## 2023-11-05 NOTE — Telephone Encounter (Signed)
  Pt c/o of Chest Pain: STAT if active CP, including tightness, pressure, jaw pain, radiating pain to shoulder/upper arm/back, CP unrelieved by Nitro. Symptoms reported of SOB, nausea, vomiting, sweating.  1. Are you having CP right now?  Yes  2. Are you experiencing any other symptoms (ex. SOB, nausea, vomiting, sweating)?   SOB   3. Is your CP continuous or coming and going?   Coming and going  4. Have you taken Nitroglycerin?   Yes - 3 tablets  5. How long have you been experiencing CP?   Middle of last week    6. If NO CP at time of call then end call with telling Pt to call back or call 911 if Chest pain returns prior to return call from triage team.   Patient stated she went to the ED and they told her she may have heart failure.  Patient stated the pain has been in back.

## 2023-11-08 ENCOUNTER — Ambulatory Visit (HOSPITAL_BASED_OUTPATIENT_CLINIC_OR_DEPARTMENT_OTHER): Payer: Medicare Other | Admitting: Cardiology

## 2023-11-08 ENCOUNTER — Encounter (HOSPITAL_BASED_OUTPATIENT_CLINIC_OR_DEPARTMENT_OTHER): Payer: Self-pay | Admitting: Cardiology

## 2023-11-08 VITALS — BP 134/66 | HR 66 | Ht <= 58 in | Wt 169.4 lb

## 2023-11-08 DIAGNOSIS — Z7189 Other specified counseling: Secondary | ICD-10-CM

## 2023-11-08 DIAGNOSIS — I2510 Atherosclerotic heart disease of native coronary artery without angina pectoris: Secondary | ICD-10-CM

## 2023-11-08 DIAGNOSIS — I5189 Other ill-defined heart diseases: Secondary | ICD-10-CM

## 2023-11-08 DIAGNOSIS — R0989 Other specified symptoms and signs involving the circulatory and respiratory systems: Secondary | ICD-10-CM

## 2023-11-08 DIAGNOSIS — I201 Angina pectoris with documented spasm: Secondary | ICD-10-CM

## 2023-11-08 DIAGNOSIS — R079 Chest pain, unspecified: Secondary | ICD-10-CM

## 2023-11-08 NOTE — Patient Instructions (Signed)

## 2023-11-08 NOTE — Progress Notes (Signed)
Cardiology Office Note:  .   Date:  11/08/2023  ID:  Kristin Roach, DOB 07/24/1941, MRN [id number] PCP: April Manson, NP  [facility] Providers Cardiologist:  Jodelle Red, MD {  History of Present Illness: .   Kristin Roach is a 82 y.o. female    Cardiac history: Saw Dr. Berline Chough in [place], [place] (Fax [phone]). Notes states: Heart murmur, mitral valve prolapse; sleep apnea, uses CPAP. Dr. Larina Bras at [facility] was her internal medicine physician. Thinks her last stress test was around 2012. Echo in 2017. Had prior heart caths in the early 1990s, did not require stents but does report that she had vasconstriction with ergotamine.   Admitted 09/07/19 for chest pain. Seen by Dr. Eldridge Dace in the hospital. Had CT coronary with nonobstructive disease.   Today: Seen in the ER for chest pain 11/15, seen for urgent follow up today. She noted constant chest pain after taking a new herbal supplement. hsTn 21, 20. Discharged for outpatient cardiac follow up.   She has been having chest pain for a week. Has been on and off. Pain limited her on the left side, front, and back of her chest. Hurts when she lays on her side. Worst pain was Sunday AM, called the line and instructed to go to ER, but she declined. Feels like an aching sensation.  Took a supplement for lymphedema from [employer], unsure of the name. Hasn't taken any more in the last week.  ROS: Denies shortness of breath at rest or with normal exertion. No PND, orthopnea, LE edema or unexpected weight gain. No syncope or palpitations. ROS otherwise negative except as noted.   Studies Reviewed: Marland Kitchen    EKG:  EKG Interpretation Date/Time:  Thursday November 08 2023 15:49:16 EST Ventricular Rate:  73 PR Interval:  180 QRS Duration:  134 QT Interval:  418 QTC Calculation: 460 R Axis:   -78  Text Interpretation: Normal sinus rhythm Left axis deviation Non-specific intra-ventricular conduction block Cannot rule out  Septal infarct , age undetermined Inferior infarct , age undetermined Confirmed by Jodelle Red [phone]) on 11/08/2023 3:54:55 PM    Physical Exam:   VS:  BP 134/66   Pulse 66   Ht 4\' 10"  (1.473 m)   Wt 169 lb 6.4 oz (76.8 kg)   SpO2 97%   BMI 35.40 kg/m    Wt Readings from Last 3 Encounters:  11/08/23 169 lb 6.4 oz (76.8 kg)  11/02/23 168 lb (76.2 kg)  11/15/22 175 lb 11.2 oz (79.7 kg)    GEN: Well nourished, well developed in no acute distress HEENT: Normal, moist mucous membranes NECK: No JVD CARDIAC: regular rhythm, normal S1 and S2, no rubs or gallops. No murmur. Tender to palpation across multiple areas of anterior left chest, posterior left chest, and left lateral chest. VASCULAR: Radial and DP pulses 2+ bilaterally. No carotid bruits RESPIRATORY:  Clear to auscultation without rales, wheezing or rhonchi  ABDOMEN: Soft, non-tender, non-distended MUSCULOSKELETAL:  Ambulates independently SKIN: Warm and dry, no edema NEUROLOGIC:  Alert and oriented x 3. No focal neuro deficits noted. PSYCHIATRIC:  Normal affect    ASSESSMENT AND PLAN: .    Atypical chest pain -ER evaluation unremarkable -tender on palpation today across multiple areas of her chest, which she reports as her pain -counseled on management of MSK chest pain -reviewed severe symptoms that need ER evaluation  Nonobstructive CAD, history of Prinzmetal's angina -CT coronary with nonobstructive CAD -feels much better since  starting imdur, tolerating well -on aspirin 81 mg daily, tolerating -continue rosuvastatin 40 mg daily, tolerating -we have previously discussed recommended avoidance of NSAIDs in CAD -counseled on red flag warning signs that need immediate medical attention -no longer on beta blocker due to prior hypotension   History of systolic dysfunction without clinical heart failure -EF 40-45% in 2020, most recent 50-55% 09/2021 -nonobstructive CAD on CT coronary -no longer on beta blocker  due to hypotension, also prevents ACEi/ARB/ARNI.    Labile BP: -has been variable, but largely normal range on imdur only. Has history of hypotension in the past.  CV risk counseling and prevention -recommend heart healthy/Mediterranean diet, with whole grains, fruits, vegetable, fish, lean meats, nuts, and olive oil. Limit salt. -recommend moderate walking, 3-5 times/week for 30-50 minutes each session. Aim for at least 150 minutes.week. Goal should be pace of 3 miles/hours, or walking 1.5 miles in 30 minutes -recommend avoidance of tobacco products. Avoid excess alcohol.   Dispo: 6 mos or sooner PRN  Signed, Jodelle Red, MD   Jodelle Red, MD, PhD, [facility] [place]  [facility]  [place]  [facility] & V[facility] at [facility] at [facility] [address] [place], [place] [address] [phone]

## 2024-01-11 ENCOUNTER — Ambulatory Visit (HOSPITAL_BASED_OUTPATIENT_CLINIC_OR_DEPARTMENT_OTHER): Payer: Medicare Other | Admitting: Cardiology

## 2024-03-17 ENCOUNTER — Other Ambulatory Visit (HOSPITAL_BASED_OUTPATIENT_CLINIC_OR_DEPARTMENT_OTHER): Payer: Self-pay | Admitting: Cardiology

## 2024-03-17 DIAGNOSIS — F439 Reaction to severe stress, unspecified: Secondary | ICD-10-CM

## 2024-03-17 DIAGNOSIS — F419 Anxiety disorder, unspecified: Secondary | ICD-10-CM

## 2024-03-17 DIAGNOSIS — I201 Angina pectoris with documented spasm: Secondary | ICD-10-CM

## 2024-05-04 ENCOUNTER — Emergency Department (HOSPITAL_BASED_OUTPATIENT_CLINIC_OR_DEPARTMENT_OTHER)
Admission: EM | Admit: 2024-05-04 | Discharge: 2024-05-05 | Disposition: A | Discharge status: Home / Self Care | Attending: Emergency Medicine | Admitting: Emergency Medicine

## 2024-05-04 ENCOUNTER — Emergency Department (HOSPITAL_BASED_OUTPATIENT_CLINIC_OR_DEPARTMENT_OTHER)

## 2024-05-04 ENCOUNTER — Other Ambulatory Visit: Payer: Self-pay

## 2024-05-04 DIAGNOSIS — R55 Syncope and collapse: Secondary | ICD-10-CM | POA: Insufficient documentation

## 2024-05-04 DIAGNOSIS — R42 Dizziness and giddiness: Secondary | ICD-10-CM | POA: Diagnosis present

## 2024-05-04 DIAGNOSIS — H538 Other visual disturbances: Secondary | ICD-10-CM | POA: Insufficient documentation

## 2024-05-04 LAB — CBC WITH DIFFERENTIAL/PLATELET
Abs Immature Granulocytes: 0.01 10*3/uL (ref 0.00–0.07)
Basophils Absolute: 0.1 10*3/uL (ref 0.0–0.1)
Basophils Relative: 1 %
Eosinophils Absolute: 0.2 10*3/uL (ref 0.0–0.5)
Eosinophils Relative: 3 %
HCT: 39.3 % (ref 36.0–46.0)
Hemoglobin: 12.7 g/dL (ref 12.0–15.0)
Immature Granulocytes: 0 %
Lymphocytes Relative: 30 %
Lymphs Abs: 2.2 10*3/uL (ref 0.7–4.0)
MCH: 29.2 pg (ref 26.0–34.0)
MCHC: 32.3 g/dL (ref 30.0–36.0)
MCV: 90.3 fL (ref 80.0–100.0)
Monocytes Absolute: 0.6 10*3/uL (ref 0.1–1.0)
Monocytes Relative: 8 %
Neutro Abs: 4.3 10*3/uL (ref 1.7–7.7)
Neutrophils Relative %: 58 %
Platelets: 232 10*3/uL (ref 150–400)
RBC: 4.35 MIL/uL (ref 3.87–5.11)
RDW: 13.4 % (ref 11.5–15.5)
WBC: 7.3 10*3/uL (ref 4.0–10.5)
nRBC: 0 % (ref 0.0–0.2)

## 2024-05-04 LAB — COMPREHENSIVE METABOLIC PANEL WITH GFR
ALT: 15 U/L (ref 0–44)
AST: 24 U/L (ref 15–41)
Albumin: 4.3 g/dL (ref 3.5–5.0)
Alkaline Phosphatase: 85 U/L (ref 38–126)
Anion gap: 11 (ref 5–15)
BUN: 17 mg/dL (ref 8–23)
CO2: 27 mmol/L (ref 22–32)
Calcium: 9.7 mg/dL (ref 8.9–10.3)
Chloride: 102 mmol/L (ref 98–111)
Creatinine, Ser: 0.62 mg/dL (ref 0.44–1.00)
GFR, Estimated: >60 mL/min (ref >60)
Glucose, Bld: 124 mg/dL — ABNORMAL HIGH (ref 70–99)
Potassium: 3.8 mmol/L (ref 3.5–5.1)
Sodium: 141 mmol/L (ref 135–145)
Total Bilirubin: 0.4 mg/dL (ref 0.0–1.2)
Total Protein: 7.2 g/dL (ref 6.5–8.1)

## 2024-05-04 LAB — CBG MONITORING, ED: Glucose-Capillary: 123 mg/dL — ABNORMAL HIGH (ref 70–99)

## 2024-05-04 MED ORDER — LACTATED RINGERS IV BOLUS
1000.0000 mL | Freq: Once | INTRAVENOUS | Status: AC
  Administered 2024-05-05: 1000 mL via INTRAVENOUS

## 2024-05-04 NOTE — ED Provider Notes (Signed)
 [place] EMERGENCY DEPARTMENT AT [facility] Provider Note   CSN: [id number] Arrival date & time: 05/04/24  2144     History Chief Complaint  Patient presents with   Dizziness    HPI Kristin Roach is a 83 y.o. female presenting for chief complaint of near syncope. States she was sitting watching TV when she got suddenly lightheaded with double vision 2 days ago. Resolved after a few hours. Today she had symptoms of higher BP and "dizziness" that she describes and near syncopal.  Patient's recorded medical, surgical, social, medication list and allergies were reviewed in the Snapshot window as part of the initial history.   Review of Systems   Review of Systems  Constitutional:  Negative for chills and fever.  HENT:  Negative for ear pain and sore throat.   Eyes:  Negative for pain and visual disturbance.  Respiratory:  Negative for cough and shortness of breath.   Cardiovascular:  Negative for chest pain and palpitations.  Gastrointestinal:  Negative for abdominal pain and vomiting.  Genitourinary:  Negative for dysuria and hematuria.  Musculoskeletal:  Negative for arthralgias and back pain.  Skin:  Negative for color change and rash.  Neurological:  Positive for light-headedness. Negative for seizures and syncope.  All other systems reviewed and are negative.   Physical Exam Updated Vital Signs BP (!) 144/77 (BP Location: Right Arm)   Pulse 67   Temp 98.2 F (36.8 C)   Resp 18   Ht 4\' 10"  (1.473 m)   Wt 76.2 kg   SpO2 95%   BMI 35.11 kg/m  Physical Exam Vitals and nursing note reviewed.  Constitutional:      General: She is not in acute distress.    Appearance: She is well-developed.  HENT:     Head: Normocephalic and atraumatic.  Eyes:     Conjunctiva/sclera: Conjunctivae normal.  Cardiovascular:     Rate and Rhythm: Normal rate and regular rhythm.     Heart sounds: No murmur heard. Pulmonary:     Effort: Pulmonary effort is normal. No  respiratory distress.     Breath sounds: Normal breath sounds.  Abdominal:     General: There is no distension.     Palpations: Abdomen is soft.     Tenderness: There is no abdominal tenderness. There is no right CVA tenderness or left CVA tenderness.  Musculoskeletal:        General: No swelling or tenderness. Normal range of motion.     Cervical back: Neck supple.  Skin:    General: Skin is warm and dry.  Neurological:     General: No focal deficit present.     Mental Status: She is alert and oriented to person, place, and time. Mental status is at baseline.     Cranial Nerves: No cranial nerve deficit.      ED Course/ Medical Decision Making/ A&P    Procedures Procedures   Medications Ordered in ED Medications  lactated ringers  bolus 1,000 mL (0 mLs Intravenous Stopped 05/05/24 0256)   Medical Decision Making:   Kristin Roach is a 83 y.o. female who presented to the ED today with a near-syncopal episode detailed above.    Patient placed on continuous vitals and telemetry monitoring while in ED which was reviewed periodically.  Complete initial physical exam performed, notably the patient  was HDS in  NAD.    Reviewed and confirmed nursing documentation for past medical history, family history, social history.  Initial Assessment:   With the patient's presentation of near syncope, most likely diagnosis is orthostatic hypotension vs vasovagal episode. Other diagnoses were considered including (but not limited to) arrythmogenic syncope, valvular abnormality, PE, aortic dissection. These are considered less likely due to history of present illness and physical exam findings.   This is most consistent with an acute life/limb threatening illness complicated by underlying chronic conditions. Additionally, patient's history appears more consistent with benign episodes including orthostatic event vs  vagal episode based on prodromal events, resolution with rest.  Initial Plan:  CT  head to evaluate for intracranial etiology given double vision a few days ago Screening labs including CBC and Metabolic panel to evaluate for infectious or metabolic etiology of disease.  Urinalysis with reflex culture ordered to evaluate for UTI or relevant urologic/nephrologic pathology.  CXR to evaluate for structural/infectious intrathoracic pathology.  EKG and single troponin to evaluate for cardiac pathology. Single troponin appropriate due to greater than 6 hours since maximal intensity of symptoms. Objective evaluation as below reviewed after administration of IVF/Telemetry monitoring  Initial Study Results:   Laboratory  All laboratory results reviewed without evidence of clinically relevant pathology.    EKG EKG was reviewed independently. Rate, rhythm, axis, intervals all examined and without medically relevant abnormality. ST segments without concerns for elevations.    Radiology:  All images reviewed independently. Agree with radiology report at this time.   CT HEAD WO CONTRAST ( ) Result Date: 05/04/2024 CLINICAL DATA:  Mental status change, unknown cause EXAM: CT HEAD WITHOUT CONTRAST TECHNIQUE: Contiguous axial images were obtained from the base of the skull through the vertex without intravenous contrast. RADIATION DOSE REDUCTION: This exam was performed according to the departmental dose-optimization program which includes automated exposure control, adjustment of the mA and/or kV according to patient size and/or use of iterative reconstruction technique. COMPARISON:  CT head September 23, 2018. FINDINGS: Brain: No evidence of acute infarction, hemorrhage, hydrocephalus, extra-axial collection or mass lesion/mass effect. Patchy white matter hypodensities are nonspecific but compatible with chronic microvascular disease. Small remote right thalamic lacunar infarct. Vascular: No hyperdense vessel. Skull: No acute fracture. Sinuses/Orbits: Clear sinuses.  No acute orbital findings.  Other: No mastoid effusions. IMPRESSION: No evidence of acute intracranial abnormality. Electronically Signed   By: Stevenson Elbe M.D.   On: 05/04/2024 23:58   DG Chest Portable 1 View Result Date: 05/04/2024 CLINICAL DATA:  Blurred vision EXAM: PORTABLE CHEST 1 VIEW COMPARISON:  11/02/2023 FINDINGS: Heart and mediastinal contours are within normal limits. No focal opacities or effusions. No acute bony abnormality. IMPRESSION: No active disease. Electronically Signed   By: Janeece Mechanic M.D.   On: 05/04/2024 23:49   Final Assessment and Plan:   On reassessment, patient grossly symptomatically improved after 5 and half hours of observation.  Dizziness episodes have stopped after IV fluid.  Patient states that she feels much better.  Discussed observation for cardiac presyncope but patient was comfortable with management in outpatient setting.  Recommend follow-up with PCP within 48 hours patient in agreement.  Ambulatory tolerating p.o. intake on reassessment.  Disposition:  I have considered need for hospitalization, however, considering all of the above, I believe this patient is stable for discharge at this time.  Patient/family educated about specific return precautions for given chief complaint and symptoms.  Patient/family educated about follow-up with PCP.     Patient/family expressed understanding of return precautions and need for follow-up. Patient spoken to regarding all imaging and laboratory results and appropriate follow up  for these results. All education provided in verbal form with additional information in written form. Time was allowed for answering of patient questions. Patient discharged.    Emergency Department Medication Summary:   Medications  lactated ringers  bolus 1,000 mL (0 mLs Intravenous Stopped 05/05/24 0256)    Clinical Impression:  1. Dizziness      Discharge   Final Clinical Impression(s) / ED Diagnoses Final diagnoses:  Dizziness    Rx / DC  Orders ED Discharge Orders     None         Onetha Bile, MD 05/05/24 [phone]

## 2024-05-04 NOTE — ED Triage Notes (Signed)
 Pt POV reporting blurred vision that began Friday night, first noticed when watching tv. Also reports dizziness that began 1pm this afternoon. No weakness or slurred speech noted in triage.

## 2024-05-05 DIAGNOSIS — R42 Dizziness and giddiness: Secondary | ICD-10-CM | POA: Diagnosis not present

## 2024-05-05 LAB — TROPONIN T, HIGH SENSITIVITY: Troponin T High Sensitivity: 17 ng/L (ref <19)

## 2024-05-05 LAB — URINALYSIS, ROUTINE W REFLEX MICROSCOPIC
Bilirubin Urine: NEGATIVE
Glucose, UA: NEGATIVE mg/dL
Hgb urine dipstick: NEGATIVE
Ketones, ur: NEGATIVE mg/dL
Leukocytes,Ua: NEGATIVE
Nitrite: NEGATIVE
Protein, ur: NEGATIVE mg/dL
Specific Gravity, Urine: 1.012 (ref 1.005–1.030)
pH: 6 (ref 5.0–8.0)

## 2024-05-05 NOTE — ED Notes (Signed)
 Patient reports symptoms have improved.  No dizziness at this time

## 2024-07-15 NOTE — Procedures (Signed)
Result scanned to media

## 2024-09-21 ENCOUNTER — Other Ambulatory Visit (HOSPITAL_BASED_OUTPATIENT_CLINIC_OR_DEPARTMENT_OTHER): Payer: Self-pay | Admitting: Cardiology

## 2024-09-21 DIAGNOSIS — F439 Reaction to severe stress, unspecified: Secondary | ICD-10-CM

## 2024-09-21 DIAGNOSIS — I201 Angina pectoris with documented spasm: Secondary | ICD-10-CM

## 2024-09-21 DIAGNOSIS — F419 Anxiety disorder, unspecified: Secondary | ICD-10-CM

## 2024-10-06 ENCOUNTER — Ambulatory Visit (HOSPITAL_BASED_OUTPATIENT_CLINIC_OR_DEPARTMENT_OTHER): Admitting: Cardiology

## 2024-10-06 NOTE — Progress Notes (Incomplete)
 Cardiology Office Note:  .   Date:  10/06/2024  ID:  Kristin Roach, DOB 05-Nov-1941, MRN [id number] PCP: Teresa Aldona CROME, NP  [facility] Providers Cardiologist:  Shelda Bruckner, MD {  History of Present Illness: .   Kristin Roach is a 83 y.o. female    Cardiac history: Saw Dr. Lynnie Danas in [place], [place] (Fax [phone]). Notes states: Heart murmur, mitral valve prolapse; sleep apnea, uses CPAP. Dr. Bethena at [facility] was her internal medicine physician. Thinks her last stress test was around 2012. Echo in 2017. Had prior heart caths in the early 1990s, did not require stents but does report that she had vasconstriction with ergotamine.   Admitted 09/07/19 for chest pain. Seen by Dr. Dann in the hospital. Had CT coronary with nonobstructive disease.   Today: Seen in the ER for chest pain 11/15, seen for urgent follow up today. She noted constant chest pain after taking a new herbal supplement. hsTn 21, 20. Discharged for outpatient cardiac follow up.   She has been having chest pain for a week. Has been on and off. Pain limited her on the left side, front, and back of her chest. Hurts when she lays on her side. Worst pain was Sunday AM, called the line and instructed to go to ER, but she declined. Feels like an aching sensation.  Took a supplement for lymphedema from [employer], unsure of the name. Hasn't taken any more in the last week.  ROS: Denies shortness of breath at rest or with normal exertion. No PND, orthopnea, LE edema or unexpected weight gain. No syncope or palpitations. ROS otherwise negative except as noted.   Studies Reviewed: SABRA    EKG:       Physical Exam:   VS:  There were no vitals taken for this visit.   Wt Readings from Last 3 Encounters:  05/04/24 168 lb (76.2 kg)  11/08/23 169 lb 6.4 oz (76.8 kg)  11/02/23 168 lb (76.2 kg)    GEN: Well nourished, well developed in no acute distress HEENT: Normal, moist mucous membranes NECK: No  JVD CARDIAC: regular rhythm, normal S1 and S2, no rubs or gallops. No murmur. Tender to palpation across multiple areas of anterior left chest, posterior left chest, and left lateral chest. VASCULAR: Radial and DP pulses 2+ bilaterally. No carotid bruits RESPIRATORY:  Clear to auscultation without rales, wheezing or rhonchi  ABDOMEN: Soft, non-tender, non-distended MUSCULOSKELETAL:  Ambulates independently SKIN: Warm and dry, no edema NEUROLOGIC:  Alert and oriented x 3. No focal neuro deficits noted. PSYCHIATRIC:  Normal affect    ASSESSMENT AND PLAN: .    Atypical chest pain -ER evaluation unremarkable -tender on palpation today across multiple areas of her chest, which she reports as her pain -counseled on management of MSK chest pain -reviewed severe symptoms that need ER evaluation  Nonobstructive CAD, history of Prinzmetal's angina -CT coronary with nonobstructive CAD -feels much better since starting imdur , tolerating well -on aspirin  81 mg daily, tolerating -continue rosuvastatin  40 mg daily, tolerating -we have previously discussed recommended avoidance of NSAIDs in CAD -counseled on red flag warning signs that need immediate medical attention -no longer on beta blocker due to prior hypotension   History of systolic dysfunction without clinical heart failure -EF 40-45% in 2020, most recent 50-55% 09/2021 -nonobstructive CAD on CT coronary -no longer on beta blocker due to hypotension, also prevents ACEi/ARB/ARNI.    Labile BP: -has been variable, but largely normal range on  imdur  only. Has history of hypotension in the past.  CV risk counseling and prevention -recommend heart healthy/Mediterranean diet, with whole grains, fruits, vegetable, fish, lean meats, nuts, and olive oil. Limit salt. -recommend moderate walking, 3-5 times/week for 30-50 minutes each session. Aim for at least 150 minutes.week. Goal should be pace of 3 miles/hours, or walking 1.5 miles in 30  minutes -recommend avoidance of tobacco products. Avoid excess alcohol.   Dispo: 6 mos or sooner PRN  Signed, Shelda Bruckner, MD   Shelda Bruckner, MD, PhD, [facility]   [facility]    Heart & Vascular at [facility] at [facility] [address], [address] [place], [place] [address] [phone]

## 2024-10-09 ENCOUNTER — Encounter (HOSPITAL_BASED_OUTPATIENT_CLINIC_OR_DEPARTMENT_OTHER): Payer: Self-pay

## 2024-10-10 ENCOUNTER — Ambulatory Visit (INDEPENDENT_AMBULATORY_CARE_PROVIDER_SITE_OTHER): Admitting: Cardiology

## 2024-10-10 ENCOUNTER — Encounter (HOSPITAL_BASED_OUTPATIENT_CLINIC_OR_DEPARTMENT_OTHER): Payer: Self-pay | Admitting: Cardiology

## 2024-10-10 VITALS — BP 134/70 | HR 79 | Ht <= 58 in | Wt 163.6 lb

## 2024-10-10 DIAGNOSIS — R0989 Other specified symptoms and signs involving the circulatory and respiratory systems: Secondary | ICD-10-CM

## 2024-10-10 DIAGNOSIS — F439 Reaction to severe stress, unspecified: Secondary | ICD-10-CM

## 2024-10-10 DIAGNOSIS — F419 Anxiety disorder, unspecified: Secondary | ICD-10-CM

## 2024-10-10 DIAGNOSIS — M79604 Pain in right leg: Secondary | ICD-10-CM

## 2024-10-10 DIAGNOSIS — Z7189 Other specified counseling: Secondary | ICD-10-CM

## 2024-10-10 DIAGNOSIS — I201 Angina pectoris with documented spasm: Secondary | ICD-10-CM

## 2024-10-10 DIAGNOSIS — I2510 Atherosclerotic heart disease of native coronary artery without angina pectoris: Secondary | ICD-10-CM | POA: Diagnosis not present

## 2024-10-10 DIAGNOSIS — M79605 Pain in left leg: Secondary | ICD-10-CM

## 2024-10-10 MED ORDER — NITROGLYCERIN 0.4 MG SL SUBL: 0.4000 mg | SUBLINGUAL_TABLET | SUBLINGUAL | 4 refills | Status: AC | PRN

## 2024-10-10 NOTE — Progress Notes (Signed)
 Cardiology Office Note:  .   Date:  10/10/2024  ID:  Kristin Roach, DOB [birth year], MRN [id number] PCP: Teresa Aldona CROME, NP  [facility] Providers Cardiologist:  Shelda Bruckner, MD {  History of Present Illness: .   Kristin Roach is a 83 y.o. female with PMH nonobstructive CAD, prinzmental's angina   Cardiac history: Saw Dr. Lynnie Danas in [place], [place] (Fax [phone]). Notes states: Heart murmur, mitral valve prolapse; sleep apnea, uses CPAP. Dr. Bethena at [facility] was her internal medicine physician. Thinks her last stress test was around 2012. Echo in 2017. Had prior heart caths in the early 1990s, did not require stents but does report that she had vasconstriction with ergotamine.   Admitted 09/07/19 for chest pain. Seen by Dr. Dann in the hospital. Had CT coronary with nonobstructive disease.   Today: She is concerned that her toes and fingers can turn blue. Follows with Dr. Hyman at the vein center for varicose veins. Has been told she has lymphedema as well. Has always had pain in her legs, limits her ability to stand and walk for a long time. She reports that she has had a lot of ultrasounds on her legs for her veins, she is not sure if she has had ABIs.   Has had one episode at night of prinzmental's angina a few weeks ago, hasn't had an episode in a long time prior to that. Hasn't been taking carvedilol  as it seemed like it didn't come with her recent prescriptions, not sure how long she has been off of it. Hasn't taken hydrochlorothiazide but she does have her chronic LE edema.   ROS: Denies shortness of breath at rest or with normal exertion. No PND, orthopnea, change in LE edema or unexpected weight gain. No syncope or palpitations. ROS otherwise negative except as noted.   Studies Reviewed: SABRA    EKG:       Physical Exam:   VS:  BP 134/70   Pulse 79   Ht 4' 10 (1.473 m)   Wt 163 lb 9.6 oz (74.2 kg)   SpO2 94%   BMI 34.19 kg/m    Wt Readings  from Last 3 Encounters:  10/10/24 163 lb 9.6 oz (74.2 kg)  05/04/24 168 lb (76.2 kg)  11/08/23 169 lb 6.4 oz (76.8 kg)    GEN: Well nourished, well developed in no acute distress HEENT: Normal, moist mucous membranes NECK: No JVD CARDIAC: regular rhythm, normal S1 and S2, no rubs or gallops. No murmur.  VASCULAR: Radial pulses 2+ bilaterally. No carotid bruits RESPIRATORY:  Clear to auscultation without rales, wheezing or rhonchi  ABDOMEN: Soft, non-tender, non-distended MUSCULOSKELETAL:  Ambulates independently SKIN: Warm and dry, no edema. Diffuse mild purply discoloration, no tissue loss, good capillary refill NEUROLOGIC:  Alert and oriented x 3. No focal neuro deficits noted. PSYCHIATRIC:  Normal affect    ASSESSMENT AND PLAN: .    Leg pain, discoloration History of LE edema -pain is not just exertional, notes all the time, but does limit her -has normal pulse on exam on left, weaker but palpable on right -will check ABIs -has hydrochlorothiazide that she uses PRN for swelling -following with vein center  Nonobstructive CAD, history of Prinzmetal's angina -CT coronary with nonobstructive CAD -feels much better since starting imdur , tolerating well -on aspirin  81 mg daily, tolerating -continue rosuvastatin  40 mg daily, tolerating -we have previously discussed recommended avoidance of NSAIDs in CAD -counseled on red flag warning signs that need  immediate medical attention -has been on and off beta blocker, she is uncertain when she last took, was held remotely for hypotension    History of systolic dysfunction without clinical heart failure -EF 40-45% in 2020, most recent 50-55% 09/2021 -nonobstructive CAD on CT coronary -history of hypotension has historically prevented ACEi/ARB/ARNI. BP not significantly elevated but higher than usual today, monitor. If there is room at future visit could consider low dose ARB first   Labile BP: -has been variable, but largely normal  range on imdur  only. Has history of hypotension in the past.  CV risk counseling and prevention -recommend heart healthy/Mediterranean diet, with whole grains, fruits, vegetable, fish, lean meats, nuts, and olive oil. Limit salt. -recommend moderate walking, 3-5 times/week for 30-50 minutes each session. Aim for at least 150 minutes.week. Goal should be pace of 3 miles/hours, or walking 1.5 miles in 30 minutes -recommend avoidance of tobacco products. Avoid excess alcohol.   Dispo: 6 mos or sooner PRN  Signed, Shelda Bruckner, MD   Shelda Bruckner, MD, PhD, [facility] [place]  [facility]  [place]  Heart & Vascular at [facility] at [facility] [address] [place], [place] [address] [phone]

## 2024-10-10 NOTE — Patient Instructions (Signed)
 Medication Instructions:  Your physician recommends that you continue on your current medications as directed. Please refer to the Current Medication list given to you today.  *If you need a refill on your cardiac medications before your next appointment, please call your pharmacy*  Lab Work: none If you have labs (blood work) drawn today and your tests are completely normal, you will receive your results only by: MyChart Message (if you have MyChart) OR A paper copy in the mail If you have any lab test that is abnormal or we need to change your treatment, we will call you to review the results.  Testing/Procedures: Your physician has requested that you have an ankle brachial index (ABI). During this test an ultrasound and blood pressure cuff are used to evaluate the arteries that supply the arms and legs with blood. Allow thirty minutes for this exam. There are no restrictions or special instructions.  Please note: We ask at that you not bring children with you during ultrasound (echo/ vascular) testing. Due to room size and safety concerns, children are not allowed in the ultrasound rooms during exams. Our front office staff cannot provide observation of children in our lobby area while testing is being conducted. An adult accompanying a patient to their appointment will only be allowed in the ultrasound room at the discretion of the ultrasound technician under special circumstances. We apologize for any inconvenience.   Follow-Up: At [facility], you and your health needs are our priority.  As part of our continuing mission to provide you with exceptional heart care, our providers are all part of one team.  This team includes your primary Cardiologist (physician) and Advanced Practice Providers or APPs (Physician Assistants and Nurse Practitioners) who all work together to provide you with the care you need, when you need it.  Your next appointment:   6 month(s)  Provider:    Shelda Bruckner, MD    We recommend signing up for the patient portal called MyChart.  Sign up information is provided on this After Visit Summary.  MyChart is used to connect with patients for Virtual Visits (Telemedicine).  Patients are able to view lab/test results, encounter notes, upcoming appointments, etc.  Non-urgent messages can be sent to your provider as well.   To learn more about what you can do with MyChart, go to [email or web address].   Other Instructions

## 2024-10-21 ENCOUNTER — Other Ambulatory Visit (HOSPITAL_BASED_OUTPATIENT_CLINIC_OR_DEPARTMENT_OTHER): Payer: Self-pay | Admitting: Cardiology

## 2024-10-21 DIAGNOSIS — F439 Reaction to severe stress, unspecified: Secondary | ICD-10-CM

## 2024-10-21 DIAGNOSIS — F419 Anxiety disorder, unspecified: Secondary | ICD-10-CM

## 2024-10-21 DIAGNOSIS — I201 Angina pectoris with documented spasm: Secondary | ICD-10-CM

## 2024-10-29 ENCOUNTER — Ambulatory Visit (INDEPENDENT_AMBULATORY_CARE_PROVIDER_SITE_OTHER)

## 2024-10-29 DIAGNOSIS — M79605 Pain in left leg: Secondary | ICD-10-CM

## 2024-10-29 DIAGNOSIS — M79604 Pain in right leg: Secondary | ICD-10-CM

## 2024-11-05 ENCOUNTER — Ambulatory Visit (HOSPITAL_BASED_OUTPATIENT_CLINIC_OR_DEPARTMENT_OTHER): Payer: Self-pay | Admitting: Cardiology
